# Patient Record
Sex: Female | Born: 1990 | Race: White | Hispanic: No | Marital: Single | Smoking: Never smoker
Health system: Southern US, Community
[De-identification: ages and names within clinical notes are randomized; demographics above are authoritative.]

## PROBLEM LIST (undated history)

## (undated) DIAGNOSIS — F319 Bipolar disorder, unspecified: Secondary | ICD-10-CM

## (undated) DIAGNOSIS — F32A Depression, unspecified: Secondary | ICD-10-CM

## (undated) DIAGNOSIS — I10 Essential (primary) hypertension: Secondary | ICD-10-CM

## (undated) DIAGNOSIS — T7840XA Allergy, unspecified, initial encounter: Secondary | ICD-10-CM

## (undated) DIAGNOSIS — R7303 Prediabetes: Secondary | ICD-10-CM

## (undated) DIAGNOSIS — F329 Major depressive disorder, single episode, unspecified: Secondary | ICD-10-CM

## (undated) DIAGNOSIS — Z789 Other specified health status: Secondary | ICD-10-CM

## (undated) DIAGNOSIS — K219 Gastro-esophageal reflux disease without esophagitis: Secondary | ICD-10-CM

## (undated) DIAGNOSIS — F419 Anxiety disorder, unspecified: Secondary | ICD-10-CM

## (undated) DIAGNOSIS — D649 Anemia, unspecified: Secondary | ICD-10-CM

## (undated) HISTORY — DX: Anemia, unspecified: D64.9

## (undated) HISTORY — DX: Depression, unspecified: F32.A

## (undated) HISTORY — DX: Anxiety disorder, unspecified: F41.9

## (undated) HISTORY — DX: Major depressive disorder, single episode, unspecified: F32.9

## (undated) HISTORY — DX: Allergy, unspecified, initial encounter: T78.40XA

## (undated) HISTORY — PX: NO PAST SURGERIES: SHX2092

---
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
## 2011-01-19 ENCOUNTER — Observation Stay: Payer: Self-pay

## 2011-01-20 ENCOUNTER — Ambulatory Visit: Payer: Self-pay | Admitting: Obstetrics and Gynecology

## 2011-01-20 ENCOUNTER — Observation Stay: Payer: Self-pay

## 2011-02-02 ENCOUNTER — Observation Stay: Payer: Self-pay

## 2011-02-09 ENCOUNTER — Observation Stay: Payer: Self-pay

## 2011-02-19 ENCOUNTER — Observation Stay: Payer: Self-pay

## 2011-02-24 ENCOUNTER — Inpatient Hospital Stay: Payer: Self-pay | Admitting: Obstetrics and Gynecology

## 2011-03-28 ENCOUNTER — Emergency Department (HOSPITAL_COMMUNITY)
Admission: EM | Admit: 2011-03-28 | Discharge: 2011-03-29 | Disposition: A | Discharge status: Home / Self Care | Payer: Medicaid Other | Attending: Emergency Medicine | Admitting: Emergency Medicine

## 2011-03-28 ENCOUNTER — Encounter (HOSPITAL_COMMUNITY): Payer: Self-pay | Admitting: *Deleted

## 2011-03-28 DIAGNOSIS — B349 Viral infection, unspecified: Secondary | ICD-10-CM

## 2011-03-28 DIAGNOSIS — B9789 Other viral agents as the cause of diseases classified elsewhere: Secondary | ICD-10-CM | POA: Insufficient documentation

## 2011-03-28 DIAGNOSIS — N390 Urinary tract infection, site not specified: Secondary | ICD-10-CM

## 2011-03-28 DIAGNOSIS — R05 Cough: Secondary | ICD-10-CM | POA: Insufficient documentation

## 2011-03-28 DIAGNOSIS — R059 Cough, unspecified: Secondary | ICD-10-CM | POA: Insufficient documentation

## 2011-03-28 DIAGNOSIS — E876 Hypokalemia: Secondary | ICD-10-CM

## 2011-03-28 NOTE — ED Notes (Signed)
Pt states she started to cough, chills, and body aches yesterday.

## 2011-03-29 ENCOUNTER — Emergency Department (HOSPITAL_COMMUNITY): Payer: Medicaid Other

## 2011-03-29 LAB — URINE MICROSCOPIC-ADD ON

## 2011-03-29 LAB — URINALYSIS, ROUTINE W REFLEX MICROSCOPIC
Bilirubin Urine: NEGATIVE
Glucose, UA: NEGATIVE mg/dL
Hgb urine dipstick: NEGATIVE
Ketones, ur: NEGATIVE mg/dL
Nitrite: NEGATIVE
Protein, ur: NEGATIVE mg/dL
Specific Gravity, Urine: 1.013 (ref 1.005–1.030)
Urobilinogen, UA: 0.2 mg/dL (ref 0.0–1.0)
pH: 8 (ref 5.0–8.0)

## 2011-03-29 LAB — POCT I-STAT, CHEM 8
Creatinine, Ser: 0.7 mg/dL (ref 0.50–1.10)
Hemoglobin: 11.2 g/dL — ABNORMAL LOW (ref 12.0–15.0)
Sodium: 145 mEq/L (ref 135–145)
TCO2: 24 mmol/L (ref 0–100)

## 2011-03-29 LAB — RAPID STREP SCREEN (MED CTR MEBANE ONLY): Streptococcus, Group A Screen (Direct): NEGATIVE

## 2011-03-29 LAB — PREGNANCY, URINE: Preg Test, Ur: NEGATIVE

## 2011-03-29 MED ORDER — MORPHINE SULFATE 4 MG/ML IJ SOLN: 4.0000 mg | Freq: Once | INTRAMUSCULAR | Status: DC

## 2011-03-29 MED ORDER — SODIUM CHLORIDE 0.9 % IV BOLUS (SEPSIS)
1000.0000 mL | Freq: Once | INTRAVENOUS | Status: AC
  Administered 2011-03-29: 1000 mL via INTRAVENOUS

## 2011-03-29 MED ORDER — POTASSIUM CHLORIDE 20 MEQ/15ML (10%) PO LIQD
40.0000 meq | Freq: Once | ORAL | Status: AC
  Administered 2011-03-29: 40 meq via ORAL
  Filled 2011-03-29: qty 30

## 2011-03-29 MED ORDER — MORPHINE SULFATE 2 MG/ML IJ SOLN
4.0000 mg | Freq: Once | INTRAMUSCULAR | Status: AC
  Administered 2011-03-29: 4 mg via INTRAVENOUS
  Filled 2011-03-29: qty 2

## 2011-03-29 MED ORDER — DEXAMETHASONE SODIUM PHOSPHATE 10 MG/ML IJ SOLN
10.0000 mg | Freq: Once | INTRAMUSCULAR | Status: AC
  Administered 2011-03-29: 10 mg via INTRAVENOUS
  Filled 2011-03-29: qty 1

## 2011-03-29 MED ORDER — ONDANSETRON HCL 4 MG/2ML IJ SOLN
4.0000 mg | Freq: Once | INTRAMUSCULAR | Status: AC
Start: 2011-03-29 — End: 2011-03-29
  Administered 2011-03-29: 4 mg via INTRAVENOUS
  Filled 2011-03-29: qty 2

## 2011-03-29 MED ORDER — NITROFURANTOIN MONOHYD MACRO 100 MG PO CAPS: 100.0000 mg | ORAL_CAPSULE | Freq: Two times a day (BID) | ORAL | Status: AC

## 2011-03-29 NOTE — ED Provider Notes (Signed)
History     CSN: [id number] Arrival date & time: 03/28/2011 11:19 PM   First MD Initiated Contact with Patient 03/29/11 0033      Chief Complaint  Patient presents with  . Influenza     Patient is a 19 y.o. female presenting with flu symptoms. The history is provided by the patient.  Influenza This is a new problem. The current episode started yesterday. The problem has been gradually worsening. Associated symptoms include chills, congestion, coughing, a fever and a sore throat. Pertinent negatives include no nausea, rash, urinary symptoms or vomiting. The symptoms are aggravated by nothing. She has tried acetaminophen and NSAIDs for the symptoms. The treatment provided no relief.  Reports onset of sorethroat, bodyaches, and fever yesterday.  Past Medical History  Diagnosis Date  . No pertinent past medical history     Past Surgical History  Procedure Date  . No past surgeries     No family history on file.  History  Substance Use Topics  . Smoking status: Never Smoker   . Smokeless tobacco: Not on file  . Alcohol Use: Yes     occa    OB History    Grav Para Term Preterm Abortions TAB SAB Ect Mult Living   1         0      Review of Systems  Constitutional: Positive for fever and chills.  HENT: Positive for congestion and sore throat.   Respiratory: Positive for cough.   Gastrointestinal: Negative for nausea and vomiting.  Skin: Negative for rash.    Allergies  Erythromycin  Home Medications   Current Outpatient Rx  Name Route Sig Dispense Refill  . ACETAMINOPHEN 100 MG/ML PO SOLN Oral Take 10 mg/kg by mouth every 4 (four) hours as needed. For headache     . MENTHOL 3 MG MT LOZG Oral Take 1 lozenge by mouth as needed. For throat pain     . OMEPRAZOLE 20 MG PO CPDR Oral Take 20 mg by mouth daily.      Marland Kitchen PROMETHAZINE HCL 25 MG RE SUPP Rectal Place 25 mg rectally every 6 (six) hours as needed. For nausea and vomiting       BP 115/50  Pulse 81   Temp(Src) 97.8 F (36.6 C) (Oral)  Resp 16  Ht 5\' 1"  (1.549 m)  Wt 124 lb (56.246 kg)  BMI 23.43 kg/m2  SpO2 99%  Breastfeeding? Unknown  Physical Exam  ED Course  Procedures Findings,  clinical impression and d/c plan. discussed w/ pt. Will plan for d/c home w/ tx for UTI and info regarding management of viral illness @ome . Pt is agreeable w/ plan.  Labs Reviewed  URINALYSIS, ROUTINE W REFLEX MICROSCOPIC - Abnormal; Notable for the following:    APPearance CLOUDY (*)    Leukocytes, UA LARGE (*)    All other components within normal limits  POCT I-STAT, CHEM 8 - Abnormal; Notable for the following:    Potassium 3.1 (*)    BUN <3 (*)    Hemoglobin 11.2 (*)    HCT 33.0 (*)    All other components within normal limits  URINE MICROSCOPIC-ADD ON - Abnormal; Notable for the following:    Squamous Epithelial / LPF MANY (*)    Bacteria, UA MANY (*)    All other components within normal limits  PREGNANCY, URINE  RAPID STREP SCREEN  I-STAT, CHEM 8  POCT PREGNANCY, URINE   Dg Chest 2 View  03/29/2011  *RADIOLOGY REPORT*  Clinical Data: Fever and cough.  CHEST - 2 VIEW  Comparison: 07/26/2008  Findings: The lungs are clear without focal consolidation, edema, effusion or pneumothorax.  Cardiopericardial silhouette is within normal limits for size.  Imaged bony structures of the thorax are intact.  IMPRESSION: Normal exam.  Original Report Authenticated By: ERIC A. MANSELL, M.D.     No diagnosis found.    MDM  HPI, PE and clinical findings c/w UTI and viral illness.        Leanne Chang, NP 04/03/11 [phone]

## 2011-04-03 NOTE — ED Provider Notes (Signed)
Medical screening examination/treatment/procedure(s) were performed by non-physician practitioner and as supervising physician I was immediately available for consultation/collaboration.  Cyndra Numbers, MD 04/03/11 [phone]

## 2011-11-07 ENCOUNTER — Encounter (HOSPITAL_COMMUNITY): Payer: Self-pay | Admitting: *Deleted

## 2011-11-07 ENCOUNTER — Emergency Department (HOSPITAL_COMMUNITY)
Admission: EM | Admit: 2011-11-07 | Discharge: 2011-11-07 | Disposition: A | Discharge status: Home / Self Care | Payer: Self-pay | Attending: Emergency Medicine | Admitting: Emergency Medicine

## 2011-11-07 DIAGNOSIS — N760 Acute vaginitis: Secondary | ICD-10-CM | POA: Insufficient documentation

## 2011-11-07 DIAGNOSIS — N72 Inflammatory disease of cervix uteri: Secondary | ICD-10-CM

## 2011-11-07 LAB — GC/CHLAMYDIA PROBE AMP, GENITAL
Chlamydia, DNA Probe: NEGATIVE
GC Probe Amp, Genital: NEGATIVE

## 2011-11-07 LAB — COMPREHENSIVE METABOLIC PANEL
ALT: 12 U/L (ref 0–35)
AST: 18 U/L (ref 0–37)
Albumin: 3.9 g/dL (ref 3.5–5.2)
CO2: 26 mEq/L (ref 19–32)
Calcium: 9.3 mg/dL (ref 8.4–10.5)
Creatinine, Ser: 0.67 mg/dL (ref 0.50–1.10)
Sodium: 143 mEq/L (ref 135–145)
Total Protein: 7.5 g/dL (ref 6.0–8.3)

## 2011-11-07 LAB — CBC WITH DIFFERENTIAL/PLATELET
Basophils Absolute: 0 10*3/uL (ref 0.0–0.1)
Basophils Relative: 0 % (ref 0–1)
Eosinophils Absolute: 0.1 10*3/uL (ref 0.0–0.7)
Eosinophils Relative: 1 % (ref 0–5)
Lymphocytes Relative: 39 % (ref 12–46)
MCHC: 32.6 g/dL (ref 30.0–36.0)
MCV: 82.2 fL (ref 78.0–100.0)
Monocytes Absolute: 0.5 10*3/uL (ref 0.1–1.0)
Platelets: 356 10*3/uL (ref 150–400)
RDW: 15.7 % — ABNORMAL HIGH (ref 11.5–15.5)
WBC: 7.7 10*3/uL (ref 4.0–10.5)

## 2011-11-07 LAB — WET PREP, GENITAL

## 2011-11-07 LAB — URINALYSIS, ROUTINE W REFLEX MICROSCOPIC
Bilirubin Urine: NEGATIVE
Nitrite: NEGATIVE
Protein, ur: 30 mg/dL — AB
Specific Gravity, Urine: 1.029 (ref 1.005–1.030)
Urobilinogen, UA: 0.2 mg/dL (ref 0.0–1.0)

## 2011-11-07 LAB — URINE MICROSCOPIC-ADD ON

## 2011-11-07 MED ORDER — CEFTRIAXONE SODIUM 1 G IJ SOLR
1.0000 g | Freq: Once | INTRAMUSCULAR | Status: AC
  Administered 2011-11-07: 1 g via INTRAMUSCULAR
  Filled 2011-11-07: qty 10

## 2011-11-07 MED ORDER — DOXYCYCLINE HYCLATE 100 MG PO TABS
100.0000 mg | ORAL_TABLET | Freq: Once | ORAL | Status: AC
  Administered 2011-11-07: 100 mg via ORAL
  Filled 2011-11-07: qty 1

## 2011-11-07 MED ORDER — METRONIDAZOLE 500 MG PO TABS: 500.0000 mg | ORAL_TABLET | Freq: Two times a day (BID) | ORAL | Status: AC

## 2011-11-07 MED ORDER — DOXYCYCLINE HYCLATE 100 MG PO CAPS: 100.0000 mg | ORAL_CAPSULE | Freq: Two times a day (BID) | ORAL | Status: AC

## 2011-11-07 NOTE — ED Notes (Signed)
The pt thinks she has a yeast infection.  Vaginal discharge mixed with blood.  lmp  She has a mirano

## 2011-11-07 NOTE — ED Provider Notes (Signed)
History     CSN: [id number]  Arrival date & time 11/07/11  0132   First MD Initiated Contact with Patient 11/07/11 [phone]      Chief Complaint  Patient presents with  . Vaginal Discharge    (Consider location/radiation/quality/duration/timing/severity/associated sxs/prior treatment) Patient is a 21 y.o. female presenting with vaginal discharge. The history is provided by the patient.  Vaginal Discharge This is a new problem. The current episode started yesterday. The problem occurs constantly. The problem has not changed since onset.Pertinent negatives include no chest pain, no abdominal pain, no headaches and no shortness of breath. Nothing aggravates the symptoms. Nothing relieves the symptoms. She has tried nothing for the symptoms.    Past Medical History  Diagnosis Date  . No pertinent past medical history     Past Surgical History  Procedure Date  . No past surgeries     No family history on file.  History  Substance Use Topics  . Smoking status: Never Smoker   . Smokeless tobacco: Not on file  . Alcohol Use: Yes     occa    OB History    Grav Para Term Preterm Abortions TAB SAB Ect Mult Living   1         0      Review of Systems  Respiratory: Negative for shortness of breath.   Cardiovascular: Negative for chest pain.  Gastrointestinal: Negative for abdominal pain.  Genitourinary: Positive for vaginal discharge.  Neurological: Negative for headaches.  All other systems reviewed and are negative.    Allergies  Erythromycin  Home Medications   Current Outpatient Rx  Name Route Sig Dispense Refill  . DOXYCYCLINE HYCLATE 100 MG PO CAPS Oral Take 1 capsule (100 mg total) by mouth 2 (two) times daily. 20 capsule 0  . METRONIDAZOLE 500 MG PO TABS Oral Take 1 tablet (500 mg total) by mouth 2 (two) times daily. 14 tablet 0    BP 110/72  Pulse 82  Temp 98.1 F (36.7 C) (Oral)  Resp 20  SpO2 99%  Physical Exam  Constitutional: She is oriented to  person, place, and time. She appears well-developed and well-nourished.  HENT:  Head: Normocephalic and atraumatic.  Eyes: Conjunctivae and EOM are normal. Pupils are equal, round, and reactive to light.  Neck: Normal range of motion.  Cardiovascular: Normal rate, regular rhythm and normal heart sounds.   Pulmonary/Chest: Effort normal and breath sounds normal.  Abdominal: Soft. Bowel sounds are normal.  Genitourinary: Vaginal discharge found.       iud in place.  + cervicitis  Musculoskeletal: Normal range of motion.  Neurological: She is alert and oriented to person, place, and time.  Skin: Skin is warm and dry.  Psychiatric: She has a normal mood and affect. Her behavior is normal.    ED Course  Procedures (including critical care time)  Labs Reviewed  URINALYSIS, ROUTINE W REFLEX MICROSCOPIC - Abnormal; Notable for the following:    APPearance CLOUDY (*)     Hgb urine dipstick LARGE (*)     Protein, ur 30 (*)     Leukocytes, UA LARGE (*)     All other components within normal limits  CBC WITH DIFFERENTIAL - Abnormal; Notable for the following:    Hemoglobin 11.1 (*)     HCT 34.1 (*)     RDW 15.7 (*)     All other components within normal limits  COMPREHENSIVE METABOLIC PANEL - Abnormal; Notable for the following:  Total Bilirubin 0.2 (*)     All other components within normal limits  URINE MICROSCOPIC-ADD ON - Abnormal; Notable for the following:    Squamous Epithelial / LPF MANY (*)     Bacteria, UA MANY (*)     All other components within normal limits  WET PREP, GENITAL - Abnormal; Notable for the following:    Clue Cells Wet Prep HPF POC FEW (*)     WBC, Wet Prep HPF POC TOO NUMEROUS TO COUNT (*)     All other components within normal limits  PREGNANCY, URINE  GC/CHLAMYDIA PROBE AMP, GENITAL   No results found.   1. Cervicitis       MDM  + cervicitis. Macrolide allergy.  Will rocephin,  Flagyl and doxy. Dc to fu, ret new/worsening  sxs        Cobie Leidner Lytle Michaels, MD 11/07/11 [phone]

## 2011-12-27 ENCOUNTER — Encounter (HOSPITAL_COMMUNITY): Payer: Self-pay | Admitting: *Deleted

## 2011-12-27 ENCOUNTER — Emergency Department (HOSPITAL_COMMUNITY): Payer: Worker's Compensation

## 2011-12-27 ENCOUNTER — Emergency Department (HOSPITAL_COMMUNITY)
Admission: EM | Admit: 2011-12-27 | Discharge: 2011-12-27 | Disposition: A | Discharge status: Home / Self Care | Payer: Worker's Compensation | Attending: Emergency Medicine | Admitting: Emergency Medicine

## 2011-12-27 ENCOUNTER — Encounter (HOSPITAL_COMMUNITY): Payer: Self-pay | Admitting: Emergency Medicine

## 2011-12-27 DIAGNOSIS — W208XXA Other cause of strike by thrown, projected or falling object, initial encounter: Secondary | ICD-10-CM | POA: Insufficient documentation

## 2011-12-27 DIAGNOSIS — R51 Headache: Secondary | ICD-10-CM | POA: Insufficient documentation

## 2011-12-27 DIAGNOSIS — S0990XA Unspecified injury of head, initial encounter: Secondary | ICD-10-CM | POA: Insufficient documentation

## 2011-12-27 DIAGNOSIS — Y92009 Unspecified place in unspecified non-institutional (private) residence as the place of occurrence of the external cause: Secondary | ICD-10-CM | POA: Insufficient documentation

## 2011-12-27 DIAGNOSIS — R42 Dizziness and giddiness: Secondary | ICD-10-CM | POA: Insufficient documentation

## 2011-12-27 MED ORDER — ONDANSETRON 4 MG PO TBDP
4.0000 mg | ORAL_TABLET | Freq: Once | ORAL | Status: AC
  Administered 2011-12-27: 4 mg via ORAL
  Filled 2011-12-27: qty 1

## 2011-12-27 MED ORDER — METOCLOPRAMIDE HCL 5 MG/ML IJ SOLN
10.0000 mg | Freq: Once | INTRAMUSCULAR | Status: AC
  Administered 2011-12-27: 10 mg via INTRAMUSCULAR
  Filled 2011-12-27: qty 2

## 2011-12-27 MED ORDER — ONDANSETRON HCL 4 MG PO TABS: 4.0000 mg | ORAL_TABLET | Freq: Four times a day (QID) | ORAL | Status: AC

## 2011-12-27 MED ORDER — ONDANSETRON HCL 4 MG/2ML IJ SOLN
INTRAMUSCULAR | Status: AC
  Administered 2011-12-27: 10:00:00
  Filled 2011-12-27: qty 2

## 2011-12-27 MED ORDER — HYDROMORPHONE HCL PF 1 MG/ML IJ SOLN
1.0000 mg | Freq: Once | INTRAMUSCULAR | Status: AC
  Administered 2011-12-27: 1 mg via INTRAMUSCULAR
  Filled 2011-12-27: qty 1
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
## 2011-12-27 NOTE — ED Notes (Signed)
After IV d/c'd pt vomited moderate amt onto floor.

## 2011-12-27 NOTE — ED Notes (Signed)
Family at bedside. Side rails up x 2 call bell at bedside
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
## 2011-12-27 NOTE — ED Provider Notes (Signed)
Medical screening examination/treatment/procedure(s) were performed by non-physician practitioner and as supervising physician I was immediately available for consultation/collaboration.  Cheri Guppy, MD 12/27/11 [phone]

## 2011-12-27 NOTE — ED Notes (Signed)
MD at bedside. 
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
## 2013-07-22 ENCOUNTER — Emergency Department: Payer: Self-pay | Admitting: Emergency Medicine

## 2013-07-22 LAB — CBC
HCT: 36.3 % (ref 35.0–47.0)
HGB: 12.1 g/dL (ref 12.0–16.0)
MCH: 29.2 pg (ref 26.0–34.0)
MCHC: 33.4 g/dL (ref 32.0–36.0)
MCV: 87 fL (ref 80–100)
PLATELETS: 341 10*3/uL (ref 150–440)
RBC: 4.16 10*6/uL (ref 3.80–5.20)
RDW: 14.5 % (ref 11.5–14.5)
WBC: 9.2 10*3/uL (ref 3.6–11.0)

## 2013-07-22 LAB — COMPREHENSIVE METABOLIC PANEL
ALK PHOS: 70 U/L
ALT: 25 U/L (ref 12–78)
ANION GAP: 4 — AB (ref 7–16)
AST: 25 U/L (ref 15–37)
Albumin: 3.8 g/dL (ref 3.4–5.0)
BUN: 12 mg/dL (ref 7–18)
Bilirubin,Total: 0.2 mg/dL (ref 0.2–1.0)
CREATININE: 0.76 mg/dL (ref 0.60–1.30)
Calcium, Total: 8.3 mg/dL — ABNORMAL LOW (ref 8.5–10.1)
Chloride: 109 mmol/L — ABNORMAL HIGH (ref 98–107)
Co2: 27 mmol/L (ref 21–32)
EGFR (Non-African Amer.): >60
GLUCOSE: 88 mg/dL (ref 65–99)
Osmolality: 279 (ref 275–301)
POTASSIUM: 3.5 mmol/L (ref 3.5–5.1)
Sodium: 140 mmol/L (ref 136–145)
Total Protein: 7.7 g/dL (ref 6.4–8.2)

## 2013-07-22 LAB — URINALYSIS, COMPLETE
BACTERIA: NONE SEEN
BILIRUBIN, UR: NEGATIVE
BLOOD: NEGATIVE
GLUCOSE, UR: NEGATIVE mg/dL (ref 0–75)
Ketone: NEGATIVE
NITRITE: NEGATIVE
PROTEIN: NEGATIVE
Ph: 6 (ref 4.5–8.0)
Specific Gravity: 1.021 (ref 1.003–1.030)
Squamous Epithelial: 9
WBC UR: 7 /HPF (ref 0–5)

## 2013-07-22 LAB — TROPONIN I: Troponin-I: <0.02 ng/mL

## 2013-07-22 LAB — LIPASE, BLOOD: Lipase: 163 U/L (ref 73–393)

## 2013-09-29 ENCOUNTER — Emergency Department: Payer: Self-pay | Admitting: Emergency Medicine

## 2014-02-16 ENCOUNTER — Encounter (HOSPITAL_COMMUNITY): Payer: Self-pay | Admitting: *Deleted

## 2014-05-13 ENCOUNTER — Emergency Department: Payer: Self-pay | Admitting: Emergency Medicine

## 2014-05-16 ENCOUNTER — Emergency Department: Payer: Self-pay | Admitting: Student

## 2015-04-14 ENCOUNTER — Encounter (HOSPITAL_COMMUNITY): Payer: Self-pay | Admitting: Emergency Medicine

## 2015-04-14 ENCOUNTER — Emergency Department (HOSPITAL_COMMUNITY)
Admission: EM | Admit: 2015-04-14 | Discharge: 2015-04-15 | Disposition: A | Discharge status: Home / Self Care | Payer: BLUE CROSS/BLUE SHIELD | Attending: Emergency Medicine | Admitting: Emergency Medicine

## 2015-04-14 DIAGNOSIS — Y9389 Activity, other specified: Secondary | ICD-10-CM | POA: Diagnosis not present

## 2015-04-14 DIAGNOSIS — S8992XA Unspecified injury of left lower leg, initial encounter: Secondary | ICD-10-CM | POA: Diagnosis not present

## 2015-04-14 DIAGNOSIS — M25562 Pain in left knee: Secondary | ICD-10-CM

## 2015-04-14 DIAGNOSIS — Y9289 Other specified places as the place of occurrence of the external cause: Secondary | ICD-10-CM | POA: Diagnosis not present

## 2015-04-14 DIAGNOSIS — Y998 Other external cause status: Secondary | ICD-10-CM | POA: Diagnosis not present

## 2015-04-14 DIAGNOSIS — W010XXA Fall on same level from slipping, tripping and stumbling without subsequent striking against object, initial encounter: Secondary | ICD-10-CM | POA: Insufficient documentation

## 2015-04-14 NOTE — ED Provider Notes (Signed)
CSN: [id number]     Arrival date & time 04/14/15  2259 History  By signing my name below, I, Irene Pap, attest that this documentation has been prepared under the direction and in the presence of [employer], PA-C. Electronically Signed: Irene Pap, ED Scribe. 04/14/2015. 12:01 AM.   Chief Complaint  Patient presents with  . Knee Injury   The history is provided by the patient. No language interpreter was used.  HPI Comments: Joan Simpson is a 24 y.o. female who presents to the Emergency Department complaining of gradually worsening, constant, left knee pain and swelling onset one day ago. Pt states that she tripped over brick and fell onto her left knee yesterday. She is not sure of any twisting mechanism of the knee. She reports worsening pain with bending at the knee, weightbearing, palpation, and ambulation. Pt reports putting ice and heat on the area to no relief. She reports her pain 10/10. She denies numbness, weakness, ankle pain, hitting head, or LOC. Pt is allergic to Tylenol, ibuprofen and erythromycin.   Past Medical History  Diagnosis Date  . No pertinent past medical history    Past Surgical History  Procedure Laterality Date  . No past surgeries     No family history on file. Social History  Substance Use Topics  . Smoking status: Never Smoker   . Smokeless tobacco: None  . Alcohol Use: Yes     Comment: occa   OB History    Gravida Para Term Preterm AB TAB SAB Ectopic Multiple Living   1         0     Review of Systems  Musculoskeletal: Positive for joint swelling and arthralgias.  Neurological: Negative for syncope, weakness and numbness.  All other systems reviewed and are negative.  Allergies  Erythromycin; Ibuprofen; and Tylenol  Home Medications   Prior to Admission medications   Medication Sig Start Date End Date Taking? Authorizing Provider  acetaminophen (TYLENOL) 325 MG tablet Take 650 mg by mouth every 4 (four) hours as needed.  For pain    Historical Provider, MD   BP 107/47 mmHg  Pulse 84  Temp(Src) 97.9 F (36.6 C) (Oral)  Resp 18  Ht 5\' 1"  (1.549 m)  Wt 57.38 kg  BMI 23.91 kg/m2  SpO2 100% Physical Exam  Constitutional: She is oriented to person, place, and time. She appears well-developed and well-nourished.  HENT:  Head: Normocephalic.  Mouth/Throat: Oropharynx is clear and moist.  Eyes: EOM are normal.  Neck: Normal range of motion.  Cardiovascular: Normal rate, regular rhythm and normal heart sounds.   Pulmonary/Chest: Effort normal and breath sounds normal.  Abdominal: Soft. There is no tenderness.  Musculoskeletal:  Diffuse tenderness throughout entire left knee along joint line and patella with mild abrasion over patella; no obvious swelling, erythema, warmth or deformity; ROM decreased secondary to pain; sensation intact to light touch with intact distal pulses  Neurological: She is alert and oriented to person, place, and time.  Psychiatric: She has a normal mood and affect.  Nursing note and vitals reviewed.   ED Course  Procedures (including critical care time) DIAGNOSTIC STUDIES: Oxygen Saturation is 97% on RA, normal by my interpretation.    COORDINATION OF CARE: 12:00 AM-Discussed treatment plan which includes x-ray with pt at bedside and pt agreed to plan.    Labs Review Labs Reviewed - No data to display  Imaging Review Dg Knee Complete 4 Views Left  04/15/2015  CLINICAL DATA:  Tripped  and fell yesterday, 10/10 knee pain. EXAM: LEFT KNEE - COMPLETE 4+ VIEW COMPARISON:  None. FINDINGS: There is no evidence of fracture, dislocation, or joint effusion. There is no evidence of arthropathy or other focal bone abnormality. Soft tissues are unremarkable. IMPRESSION: Negative. Electronically Signed   By: Elon Alas M.D.   On: 04/15/2015 00:31   I have personally reviewed and evaluated these images and lab results as part of my medical decision-making.   EKG  Interpretation None      Medications - No data to display Filed Vitals:   04/14/15 2329 04/15/15 0108  BP: 114/69 107/47  Pulse: 100 84  Temp: 97.9 F (36.6 C)   Resp: 18 18    Patient complains of extreme pain in her left knee, 10/10, however is texting and playing games on her phone in no apparent distress. MDM  Patient presents with knee pain after falling down steps yesterday. Has been ambulatory since that time. Patient X-Ray negative for obvious fracture or dislocation. Patient with nonfocal neuro exam, no edema, erythema or overt warmth to the joint. Patient is not fully cooperative with physical exam. Low suspicion of hemarthrosis, septic joint. Doubt tibial plateau fracture. Pt advised to follow up with orthopedics if symptoms persist for possibility of missed fracture diagnosis. Patient given brace while in ED and crutches, conservative therapy recommended and discussed. Patient will be dc home & is agreeable with above plan.  Final diagnoses:  Left knee pain    I personally performed the services described in this documentation, which was scribed in my presence. The recorded information has been reviewed and is accurate.     Comer Locket, PA-C 04/15/15 0148  Veryl Speak, MD 04/15/15 [phone]

## 2015-04-14 NOTE — ED Notes (Signed)
Pt. tripped and fell forward yesterday injured her left knee with pain and mild swelling , denies LOC / ambulatory .

## 2015-04-15 ENCOUNTER — Emergency Department (HOSPITAL_COMMUNITY): Payer: BLUE CROSS/BLUE SHIELD

## 2015-04-15 ENCOUNTER — Emergency Department (HOSPITAL_COMMUNITY)
Admission: EM | Admit: 2015-04-15 | Discharge: 2015-04-15 | Discharge status: Absent without leave | Payer: BLUE CROSS/BLUE SHIELD

## 2015-04-15 ENCOUNTER — Ambulatory Visit (INDEPENDENT_AMBULATORY_CARE_PROVIDER_SITE_OTHER): Payer: BLUE CROSS/BLUE SHIELD

## 2015-04-15 ENCOUNTER — Ambulatory Visit (INDEPENDENT_AMBULATORY_CARE_PROVIDER_SITE_OTHER): Payer: BLUE CROSS/BLUE SHIELD | Admitting: Physician Assistant

## 2015-04-15 VITALS — BP 102/74 | HR 100 | Temp 98.2°F | Resp 16 | Ht 61.0 in

## 2015-04-15 DIAGNOSIS — M79605 Pain in left leg: Secondary | ICD-10-CM

## 2015-04-15 DIAGNOSIS — M25562 Pain in left knee: Secondary | ICD-10-CM

## 2015-04-15 MED ORDER — HYDROCODONE-ACETAMINOPHEN 5-325 MG PO TABS: 1.0000 | ORAL_TABLET | Freq: Four times a day (QID) | ORAL | Status: DC | PRN

## 2015-04-15 MED ORDER — ACETAMINOPHEN 325 MG PO TABS
1000.0000 mg | ORAL_TABLET | Freq: Once | ORAL | Status: AC
  Administered 2015-04-15: 975 mg via ORAL

## 2015-04-15 NOTE — ED Notes (Signed)
PA at bedside.

## 2015-04-15 NOTE — Progress Notes (Signed)
[facility] [address] [place] [address] [phone]  Date:  04/15/2015   Name:  Joan Simpson   DOB:  July 27, 1990   MRN:  [id number]  PCP:  PROVIDER NOT IN SYSTEM    Chief Complaint: Knee Pain and Medication Refill   History of Present Illness:  This is a 24 y.o. female with PMH anxiety who is presenting with left knee injury that occurred 3 days ago. She was tripped going down some stairs off a porch. She tripped and fell, falling from top stop to the bottom (4 steps total) and landing directing onto her left knee on brick. She was seen in the ED yesterday 12/28. Radiograph of the knee was negative at that time. They gave her crutches. She states since that time her pain has been worsening. Pain rated 10/10. She is unable to put any pressure whatsoever on that leg. She denies numbness.  When she got here and was roomed, she started to feel nauseated from the pain. She got up quickly to throw up in the trash can but fell. She was unable to get up on her own, we helped her back onto the exam table. Pt was crying and started hyperventilating. This got better with breathing into a bag.  Review of Systems:  Review of Systems See HPI  There are no active problems to display for this patient.   Prior to Admission medications   Medication Sig Start Date End Date Taking? Authorizing Provider  clonazePAM (KLONOPIN) 0.5 MG tablet Take 0.5 mg by mouth 2 (two) times daily as needed for anxiety.   Yes Historical Provider, MD  Sertraline HCl (ZOLOFT PO) Take by mouth.   Yes Historical Provider, MD  acetaminophen (TYLENOL) 325 MG tablet Take 650 mg by mouth every 4 (four) hours as needed. Reported on 04/15/2015    Historical Provider, MD    Allergies  Allergen Reactions  . Erythromycin Anaphylaxis    Says she throws up and can't breath  . Ibuprofen   . Tylenol [Acetaminophen]     Past Surgical History  Procedure Laterality Date  . No past surgeries       Social History  Substance Use Topics  . Smoking status: Never Smoker   . Smokeless tobacco: None  . Alcohol Use: Yes     Comment: occa    History reviewed. No pertinent family history.  Medication list has been reviewed and updated.  Physical Examination:  Physical Exam  Constitutional: She is oriented to person, place, and time. She appears well-developed and well-nourished. No distress.  HENT:  Head: Normocephalic and atraumatic.  Right Ear: Hearing normal.  Left Ear: Hearing normal.  Nose: Nose normal.  Eyes: Conjunctivae and lids are normal. Right eye exhibits no discharge. Left eye exhibits no discharge. No scleral icterus.  Cardiovascular: Normal rate, regular rhythm and normal pulses.   Pulmonary/Chest: Effort normal. No respiratory distress.  Musculoskeletal:       Right knee: Normal.       Left knee: She exhibits decreased range of motion (unwilling to flex knee) and swelling (mild knee swelling). Tenderness (diffuse, over knee and lower leg) found.  Mild ecchymosis lateral left knee Pain out of proportion to exam. Light touch of the skin around knee and lower leg produces pain. Deeper palpation causes significant pain that pt cannot tolerate. Pedal pulses intact. Sensation intact.  Neurological: She is alert and oriented to person, place, and time.  Uses crutches for ambulation  Skin:  Skin is warm, dry and intact. No lesion and no rash noted.  Psychiatric: She has a normal mood and affect. Her speech is normal and behavior is normal. Thought content normal.    BP 102/74 mmHg  Pulse 100  Temp(Src) 98.2 F (36.8 C) (Oral)  Resp 16  Ht 5\' 1"  (1.549 m)  SpO2 98%  [place] controlled substance database: negative except for clonazepam  Assessment and Plan:  1. Left knee pain 2. Leg pain, left Pain out of proportion to exam. Gave #10 norco. Sent to ED for further evaluation to rule out possible DVT or compartment syndrome, although unlikely. Spoke to Child psychotherapist. - DG Knee Complete 4 Views Left; Future - acetaminophen (TYLENOL) tablet 975 mg; Take 3 tablets (975 mg total) by mouth once. - HYDROcodone-acetaminophen (NORCO) 5-325 MG tablet; Take 1 tablet by mouth every 6 (six) hours as needed.  Dispense: 10 tablet; Refill: 0 - DG Tibia/Fibula Left; Future - acetaminophen (TYLENOL) tablet 975 mg; Take 3 tablets (975 mg total) by mouth once. - HYDROcodone-acetaminophen (NORCO) 5-325 MG tablet; Take 1 tablet by mouth every 6 (six) hours as needed.  Dispense: 10 tablet; Refill: 0   Benjaman Pott. Drenda Freeze, MHS [facility]  04/15/2015

## 2015-04-15 NOTE — Discharge Instructions (Signed)
There does not appear to be an emergent cause for your knee pain at this time. Your x-rays were negative for any broken bones or dislocations. It is important she follow up with your doctor or Dr. Doran Durand with [facility] orthopedics for further evaluation of your knee pain. Use your knee brace as directed. Return to ED for any new or worsening symptoms as we discussed  How to Use a Knee Brace A knee brace is a device that you wear to support your knee, especially if the knee is healing after an injury or surgery. There are several types of knee braces. Some are designed to prevent an injury (prophylactic brace). These are often worn during sports. Others support an injured knee (functional brace) or keep it still while it heals (rehabilitative brace). People with severe arthritis of the knee may benefit from a brace that takes some pressure off the knee (unloader brace). Most knee braces are made from a combination of cloth and metal or plastic.  You may need to wear a knee brace to:  Relieve knee pain.  Help your knee support your weight (improve stability).  Help you walk farther (improve mobility).  Prevent injury.  Support your knee while it heals from surgery or from an injury. RISKS AND COMPLICATIONS Generally, knee braces are very safe to wear. However, problems may occur, including:  Skin irritation that may lead to infection.  Making your condition worse if you wear the brace in the wrong way. HOW TO USE A KNEE BRACE Different braces will have different instructions for use. Your health care provider will tell you or show you:  How to put on your brace.  How to adjust the brace.  When and how often to wear the brace.  How to remove the brace.  If you will need any assistive devices in addition to the brace, such as crutches or a cane. In general, your brace should:  Have the hinge of the brace line up with the bend of your knee.  Have straps, hooks, or tapes that fasten  snugly around your leg.  Not feel too tight or too loose. HOW TO CARE FOR A KNEE BRACE  Check your brace often for signs of damage, such as loose connections or attachments. Your knee brace may get damaged or wear out during normal use.  Wash the fabric parts of your brace with soap and water.  Read the insert that comes with your brace for other specific care instructions. SEEK MEDICAL CARE IF:  Your knee brace is too loose or too tight and you cannot adjust it.  Your knee brace causes skin redness, swelling, bruising, or irritation.  Your knee brace is not helping.  Your knee brace is making your knee pain worse.   This information is not intended to replace advice given to you by your health care provider. Make sure you discuss any questions you have with your health care provider.   Document Released: 06/24/2003 Document Revised: 12/23/2014 Document Reviewed: 07/27/2014 Elsevier Interactive Patient Education 2016 [place] Pain Joint pain, which is also called arthralgia, can be caused by many things. Joint pain often goes away when you follow your health care provider's instructions for relieving pain at home. However, joint pain can also be caused by conditions that require further treatment. Common causes of joint pain include:  Bruising in the area of the joint.  Overuse of the joint.  Wear and tear on the joints that occur with aging (osteoarthritis).  Various other forms of arthritis.  A buildup of a crystal form of uric acid in the joint (gout).  Infections of the joint (septic arthritis) or of the bone (osteomyelitis). Your health care provider may recommend medicine to help with the pain. If your joint pain continues, additional tests may be needed to diagnose your condition. HOME CARE INSTRUCTIONS Watch your condition for any changes. Follow these instructions as directed to lessen the pain that you are feeling.  Take medicines only as directed  by your health care provider.  Rest the affected area for as long as your health care provider says that you should. If directed to do so, raise the painful joint above the level of your heart while you are sitting or lying down.  Do not do things that cause or worsen pain.  If directed, apply ice to the painful area:  Put ice in a plastic bag.  Place a towel between your skin and the bag.  Leave the ice on for 20 minutes, 2-3 times per day.  Wear an elastic bandage, splint, or sling as directed by your health care provider. Loosen the elastic bandage or splint if your fingers or toes become numb and tingle, or if they turn cold and blue.  Begin exercising or stretching the affected area as directed by your health care provider. Ask your health care provider what types of exercise are safe for you.  Keep all follow-up visits as directed by your health care provider. This is important. SEEK MEDICAL CARE IF:  Your pain increases, and medicine does not help.  Your joint pain does not improve within 3 days.  You have increased bruising or swelling.  You have a fever.  You lose 10 lb (4.5 kg) or more without trying. SEEK IMMEDIATE MEDICAL CARE IF:  You are not able to move the joint.  Your fingers or toes become numb or they turn cold and blue.   This information is not intended to replace advice given to you by your health care provider. Make sure you discuss any questions you have with your health care provider.   Document Released: 04/03/2005 Document Revised: 04/24/2014 Document Reviewed: 01/13/2014 Elsevier Interactive Patient Education [employer].

## 2015-04-15 NOTE — Patient Instructions (Signed)
Go to [facility] ED Norco as needed for pain.

## 2015-10-28 ENCOUNTER — Ambulatory Visit: Payer: BLUE CROSS/BLUE SHIELD

## 2015-10-29 ENCOUNTER — Ambulatory Visit (INDEPENDENT_AMBULATORY_CARE_PROVIDER_SITE_OTHER): Payer: BLUE CROSS/BLUE SHIELD

## 2015-10-29 ENCOUNTER — Ambulatory Visit (INDEPENDENT_AMBULATORY_CARE_PROVIDER_SITE_OTHER): Payer: BLUE CROSS/BLUE SHIELD | Admitting: Family Medicine

## 2015-10-29 VITALS — BP 122/72 | HR 101 | Temp 98.1°F | Resp 17 | Ht 61.0 in | Wt 139.0 lb

## 2015-10-29 DIAGNOSIS — M25561 Pain in right knee: Secondary | ICD-10-CM | POA: Diagnosis not present

## 2015-10-29 MED ORDER — HYDROCODONE-ACETAMINOPHEN 5-325 MG PO TABS: 1.0000 | ORAL_TABLET | Freq: Four times a day (QID) | ORAL | Status: DC | PRN

## 2015-10-29 NOTE — Progress Notes (Signed)
Subjective:  By signing my name below, I, [name], attest that this documentation has been prepared under the direction and in the presence of Merri Ray, MD.  Electronically Signed: [name], ED Scribe. 10/29/2015. 10:23 AM.   Patient ID: Joan Simpson, female    DOB: 10/14/1990, 25 y.o.   MRN: [id number]  HPI   Chief Complaint  Patient presents with  . Knee Pain    right    HPI Comments: Joan Simpson is a 25 y.o. female who presents to the [facility] complaining of gradually worsening, right knee pain onset 5 years but has worsened over the past 2 weeks. She reports initial injury occurred at work 5 years ago, filed under [employer]. At that time it was suspected tear to meniscus but this was not seen on MRI. She went to PT and was followed by [facility]. Over the past 2 weeks pain has worsened with feeling and hearing popping and cracking in right knee with movement and walking. She also notes pain with sitting. States knee will occasionally lock making it difficult to walk. She has tried OTC brace, ibuprofen, and tylenol. She last had hydrocodone for lower back pain few months ago. Denies history of addiction or difficulty with narcotic pain medicines.  There are no active problems to display for this patient.  Past Medical History  Diagnosis Date  . No pertinent past medical history   . Allergy   . Anxiety   . Depression    Past Surgical History  Procedure Laterality Date  . No past surgeries     Allergies  Allergen Reactions  . Erythromycin Anaphylaxis    Says she throws up and can't breath  . Ibuprofen   . Tylenol [Acetaminophen]    Prior to Admission medications   Not on File   Social History   Social History  . Marital Status: Married    Spouse Name: N/A  . Number of Children: N/A  . Years of Education: N/A   Occupational History  . Not on file.   Social History Main Topics  . Smoking status: Never Smoker   .  Smokeless tobacco: Not on file  . Alcohol Use: Yes     Comment: occa  . Drug Use: No  . Sexual Activity: Yes   Other Topics Concern  . Not on file   Social History Narrative   Review of Systems  Musculoskeletal: Positive for arthralgias. Negative for myalgias.  Skin: Negative for color change and wound.  Neurological: Negative for weakness and numbness.   Objective:   Physical Exam  Constitutional: She is oriented to person, place, and time. She appears well-developed and well-nourished. No distress.  HENT:  Head: Normocephalic and atraumatic.  Eyes: Conjunctivae and EOM are normal.  Neck: Neck supple.  Cardiovascular: Normal rate.   Pulmonary/Chest: Effort normal.  Musculoskeletal: Normal range of motion.  Tender along lateral joint line anteriorly. Tender along patellar tendon and diffusely along anterior knee. Skin intact. No erythema. No effusion. guarded only flex 45-50 degrees. Full extension. guarded with valgus testing and minimal varus testing. Unable to do other knee testing due to guarded exam  Neurological: She is alert and oriented to person, place, and time.  Skin: Skin is warm and dry.  Psychiatric: She has a normal mood and affect. Her behavior is normal.  Nursing note and vitals reviewed.    Filed Vitals:   10/29/15 0917  BP: 122/72  Pulse: 101  Temp: 98.1  F (36.7 C)  TempSrc: Oral  Resp: 17  Height: 5\' 1"  (1.549 m)  Weight: 139 lb (63.05 kg)  SpO2: 99%    Dg Knee Complete 4 Views Right  10/29/2015  CLINICAL DATA:  Chronic right knee pain, progressing EXAM: RIGHT KNEE - COMPLETE 4+ VIEW COMPARISON:  None. FINDINGS: Frontal, lateral, and bilateral oblique views were obtained. There is no fracture or dislocation. No joint effusion. There is slight narrowing medially. Other joint spaces appear normal. No erosive change. IMPRESSION: Slight joint space narrowing medially. No fracture or joint effusion. No erosive change. Electronically Signed   By: Lowella Grip III M.D.   On: 10/29/2015 11:16   Assessment & Plan:   Joan Simpson is a 25 y.o. female Right knee pain - Plan: DG Knee Complete 4 Views Right, Ambulatory referral to Orthopedic Surgery, HYDROcodone-acetaminophen (NORCO/VICODIN) 5-325 MG tablet, Crutches, MR Knee Right Wo Contrast  - Long-standing intermittent pain, now with worsening pain past 2 weeks, difficulty with weightbearing and flexing knee, and guarded exam. Differential includes meniscal tear not initially seen, or patellofemoral pain.   -Due to amount of pain unresolved with over-the-counter anti-inflammatories, and difficulty with range of motion in knee, will arrange for MRI, refer to orthopedics, crutches as needed, ibuprofen 800 mg every 8 hours when necessary with food, and hydrocodone short-term. RTC precautions given if worsening.  Meds ordered this encounter  Medications  . HYDROcodone-acetaminophen (NORCO/VICODIN) 5-325 MG tablet    Sig: Take 1 tablet by mouth every 6 (six) hours as needed for moderate pain.    Dispense:  20 tablet    Refill:  0   Patient Instructions       IF you received an x-ray today, you will receive an invoice from [facility]. Please contact [facility] at [phone] with questions or concerns regarding your invoice.   IF you received labwork today, you will receive an invoice from [employer]. Please contact [facility] at [phone] with questions or concerns regarding your invoice.   Our billing staff will not be able to assist you with questions regarding bills from these companies.  You will be contacted with the lab results as soon as they are available. The fastest way to get your results is to activate your My Chart account. Instructions are located on the last page of this paperwork. If you have not heard from [place] regarding the results in 2 weeks, please contact this office.     I will refer you to orthopedics, and arrange  for the MRI of your knee. Crutches as needed for now if you're having pain with weightbearing. Ibuprofen up to 800 mg every 8 hours with food. Stop if any stomach upset with this medication. If needed for breakthrough pain, I did write hydrocodone to be used up to every 6 hours. Office visit would be needed for any further refills if you have not been seen by orthopedics at that time.  Return to the clinic or go to the nearest emergency room if any of your symptoms worsen or new symptoms occur.  Knee Pain Knee pain is a very common symptom and can have many causes. Knee pain often goes away when you follow your health care provider's instructions for relieving pain and discomfort at home. However, knee pain can develop into a condition that needs treatment. Some conditions may include:  Arthritis caused by wear and tear (osteoarthritis).  Arthritis caused by swelling and irritation (rheumatoid arthritis or gout).  A cyst or  growth in your knee.  An infection in your knee joint.  An injury that will not heal.  Damage, swelling, or irritation of the tissues that support your knee (torn ligaments or tendinitis). If your knee pain continues, additional tests may be ordered to diagnose your condition. Tests may include X-rays or other imaging studies of your knee. You may also need to have fluid removed from your knee. Treatment for ongoing knee pain depends on the cause, but treatment may include:  Medicines to relieve pain or swelling.  Steroid injections in your knee.  Physical therapy.  Surgery. HOME CARE INSTRUCTIONS  Take medicines only as directed by your health care provider.  Rest your knee and keep it raised (elevated) while you are resting.  Do not do things that cause or worsen pain.  Avoid high-impact activities or exercises, such as running, jumping rope, or doing jumping jacks.  Apply ice to the knee area:  Put ice in a plastic bag.  Place a towel between your skin  and the bag.  Leave the ice on for 20 minutes, 2-3 times a day.  Ask your health care provider if you should wear an elastic knee support.  Keep a pillow under your knee when you sleep.  Lose weight if you are overweight. Extra weight can put pressure on your knee.  Do not use any tobacco products, including cigarettes, chewing tobacco, or electronic cigarettes. If you need help quitting, ask your health care provider. Smoking may slow the healing of any bone and joint problems that you may have. SEEK MEDICAL CARE IF:  Your knee pain continues, changes, or gets worse.  You have a fever along with knee pain.  Your knee buckles or locks up.  Your knee becomes more swollen. SEEK IMMEDIATE MEDICAL CARE IF:   Your knee joint feels hot to the touch.  You have chest pain or trouble breathing.   This information is not intended to replace advice given to you by your health care provider. Make sure you discuss any questions you have with your health care provider.   Document Released: 01/29/2007 Document Revised: 04/24/2014 Document Reviewed: 11/17/2013 Elsevier Interactive Patient Education [employer].      I personally performed the services described in this documentation, which was scribed in my presence. The recorded information has been reviewed and considered, and addended by me as needed.   Signed,   Merri Ray, MD [facility].  10/29/2015 11:37 AM

## 2015-10-29 NOTE — Patient Instructions (Addendum)
IF you received an x-ray today, you will receive an invoice from [facility]. Please contact [facility] at [phone] with questions or concerns regarding your invoice.   IF you received labwork today, you will receive an invoice from [employer]. Please contact [facility] at [phone] with questions or concerns regarding your invoice.   Our billing staff will not be able to assist you with questions regarding bills from these companies.  You will be contacted with the lab results as soon as they are available. The fastest way to get your results is to activate your My Chart account. Instructions are located on the last page of this paperwork. If you have not heard from [place] regarding the results in 2 weeks, please contact this office.     I will refer you to orthopedics, and arrange for the MRI of your knee. Crutches as needed for now if you're having pain with weightbearing. Ibuprofen up to 800 mg every 8 hours with food. Stop if any stomach upset with this medication. If needed for breakthrough pain, I did write hydrocodone to be used up to every 6 hours. Office visit would be needed for any further refills if you have not been seen by orthopedics at that time.  Return to the clinic or go to the nearest emergency room if any of your symptoms worsen or new symptoms occur.  Knee Pain Knee pain is a very common symptom and can have many causes. Knee pain often goes away when you follow your health care provider's instructions for relieving pain and discomfort at home. However, knee pain can develop into a condition that needs treatment. Some conditions may include:  Arthritis caused by wear and tear (osteoarthritis).  Arthritis caused by swelling and irritation (rheumatoid arthritis or gout).  A cyst or growth in your knee.  An infection in your knee joint.  An injury that will not heal.  Damage, swelling, or irritation of the tissues  that support your knee (torn ligaments or tendinitis). If your knee pain continues, additional tests may be ordered to diagnose your condition. Tests may include X-rays or other imaging studies of your knee. You may also need to have fluid removed from your knee. Treatment for ongoing knee pain depends on the cause, but treatment may include:  Medicines to relieve pain or swelling.  Steroid injections in your knee.  Physical therapy.  Surgery. HOME CARE INSTRUCTIONS  Take medicines only as directed by your health care provider.  Rest your knee and keep it raised (elevated) while you are resting.  Do not do things that cause or worsen pain.  Avoid high-impact activities or exercises, such as running, jumping rope, or doing jumping jacks.  Apply ice to the knee area:  Put ice in a plastic bag.  Place a towel between your skin and the bag.  Leave the ice on for 20 minutes, 2-3 times a day.  Ask your health care provider if you should wear an elastic knee support.  Keep a pillow under your knee when you sleep.  Lose weight if you are overweight. Extra weight can put pressure on your knee.  Do not use any tobacco products, including cigarettes, chewing tobacco, or electronic cigarettes. If you need help quitting, ask your health care provider. Smoking may slow the healing of any bone and joint problems that you may have. SEEK MEDICAL CARE IF:  Your knee pain continues, changes, or gets worse.  You have a fever along  with knee pain.  Your knee buckles or locks up.  Your knee becomes more swollen. SEEK IMMEDIATE MEDICAL CARE IF:   Your knee joint feels hot to the touch.  You have chest pain or trouble breathing.   This information is not intended to replace advice given to you by your health care provider. Make sure you discuss any questions you have with your health care provider.   Document Released: 01/29/2007 Document Revised: 04/24/2014 Document Reviewed:  11/17/2013 Elsevier Interactive Patient Education [employer].

## 2016-01-06 ENCOUNTER — Encounter (HOSPITAL_COMMUNITY): Payer: Self-pay | Admitting: Emergency Medicine

## 2016-01-06 ENCOUNTER — Ambulatory Visit (HOSPITAL_COMMUNITY)
Admission: EM | Admit: 2016-01-06 | Discharge: 2016-01-06 | Disposition: A | Discharge status: Home / Self Care | Payer: BLUE CROSS/BLUE SHIELD | Attending: Internal Medicine | Admitting: Internal Medicine

## 2016-01-06 DIAGNOSIS — J069 Acute upper respiratory infection, unspecified: Secondary | ICD-10-CM | POA: Diagnosis not present

## 2016-01-06 DIAGNOSIS — F419 Anxiety disorder, unspecified: Secondary | ICD-10-CM | POA: Insufficient documentation

## 2016-01-06 DIAGNOSIS — F329 Major depressive disorder, single episode, unspecified: Secondary | ICD-10-CM | POA: Insufficient documentation

## 2016-01-06 DIAGNOSIS — J029 Acute pharyngitis, unspecified: Secondary | ICD-10-CM | POA: Diagnosis not present

## 2016-01-06 LAB — POCT RAPID STREP A: STREPTOCOCCUS, GROUP A SCREEN (DIRECT): NEGATIVE

## 2016-01-06 NOTE — ED Triage Notes (Signed)
Patient complains of sore throat, concerned for strep.  Onset Sunday of symptoms.  Reports fever of 101 on sunday

## 2016-01-06 NOTE — Discharge Instructions (Signed)
°  You may take 400-600mg  Ibuprofen (Motrin) every 6-8 hours for fever and pain  Alternate with Tylenol  You may take 500mg  Tylenol every 4-6 hours as needed for fever and pain  Follow-up with your primary care provider next week for recheck of symptoms if not improving.  Be sure to drink plenty of fluids and rest, at least 8hrs of sleep a night, preferably more while you are sick. Return urgent care or go to closest ER if you cannot keep down fluids/signs of dehydration, fever not reducing with Tylenol, difficulty breathing/wheezing, stiff neck, worsening condition, or other concerns (see below)   Your rapid strep test was Negative today, however, a more detailed test known as a culture has been sent to the lab for more testing. This takes about 24-48 hours.  If the culture comes back Positive, we will let you know and have an antibiotic called into the pharmacy for you.  If it is negative, your symptoms are likely from a virus and should resolve within 1 week.  If you develop difficulty breathing or swallowing liquids please go to closest emergency department for further evaluation and treatment.

## 2016-01-06 NOTE — ED Provider Notes (Signed)
CSN: [id number]     Arrival date & time 01/06/16  1539 History   First MD Initiated Contact with Patient 01/06/16 1627     Chief Complaint  Patient presents with  . Sore Throat   (Consider location/radiation/quality/duration/timing/severity/associated sxs/prior Treatment) HPI Joan Simpson is a 25 y.o. female presenting to UC with c/o sore throat with associated cough and fever of 101*F earlier in the week.  She also reports mild fatigue.  Throat pain is 6/10, worse with swallowing.  She has taken some OTC pain medication with mild temporary relief. Denies ear pain.  Mild nausea but no vomiting or diarrhea. No sick contacts or recent travel. She has been able to keep down PO fluids.   Past Medical History:  Diagnosis Date  . Allergy   . Anxiety   . Depression   . No pertinent past medical history    Past Surgical History:  Procedure Laterality Date  . NO PAST SURGERIES     No family history on file. Social History  Substance Use Topics  . Smoking status: Never Smoker  . Smokeless tobacco: Not on file  . Alcohol use Yes     Comment: occa   OB History    Gravida Para Term Preterm AB Living   1         0   SAB TAB Ectopic Multiple Live Births                 Review of Systems  Constitutional: Positive for chills, fatigue and fever.  HENT: Positive for congestion and sore throat. Negative for ear pain, rhinorrhea and sneezing.   Respiratory: Positive for cough. Negative for shortness of breath.   Gastrointestinal: Positive for nausea. Negative for abdominal pain, diarrhea and vomiting.  Neurological: Negative for dizziness, light-headedness and headaches.    Allergies  Erythromycin  Home Medications   Prior to Admission medications   Medication Sig Start Date End Date Taking? Authorizing Provider  HYDROcodone-acetaminophen (NORCO/VICODIN) 5-325 MG tablet Take 1 tablet by mouth every 6 (six) hours as needed for moderate pain. 10/29/15   Wendie Agreste, MD   Meds  Ordered and Administered this Visit  Medications - No data to display  BP 133/80 (BP Location: Left Arm)   Pulse 84   Temp 98.4 F (36.9 C) (Oral)   Resp 16   SpO2 100%  No data found.   Physical Exam  Constitutional: She appears well-developed and well-nourished. No distress.  HENT:  Head: Normocephalic and atraumatic.  Right Ear: Tympanic membrane normal.  Left Ear: Tympanic membrane normal.  Nose: Nose normal.  Mouth/Throat: Uvula is midline and mucous membranes are normal. Posterior oropharyngeal edema and posterior oropharyngeal erythema present. No oropharyngeal exudate or tonsillar abscesses.  Eyes: Conjunctivae are normal. No scleral icterus.  Neck: Normal range of motion. Neck supple.  Cardiovascular: Normal rate, regular rhythm and normal heart sounds.   Pulmonary/Chest: Effort normal and breath sounds normal. No stridor. No respiratory distress. She has no wheezes. She has no rales.  Abdominal: Soft. She exhibits no distension. There is no tenderness.  Musculoskeletal: Normal range of motion.  Lymphadenopathy:    She has no cervical adenopathy.  Neurological: She is alert.  Skin: Skin is warm and dry. She is not diaphoretic.  Nursing note and vitals reviewed.   Urgent Care Course   Clinical Course    Procedures (including critical care time)  Labs Review Labs Reviewed  POCT RAPID STREP A    Imaging Review No  results found.    MDM   1. Upper respiratory infection   2. Viral pharyngitis    Pt c/o sore throat with cough and fever of 101*F earlier this week.   Rapid strep: Negative Culture sent.  Encouraged symptomatic treatment.  Advised pt to use acetaminophen and ibuprofen as needed for fever and pain. Encouraged rest and fluids. F/u with PCP in 7-10 days if not improving, sooner if worsening. Pt verbalized understanding and agreement with tx plan.     Noland Fordyce, PA-C 01/06/16 1710

## 2016-01-08 LAB — CULTURE, GROUP A STREP (THRC)

## 2016-03-06 ENCOUNTER — Ambulatory Visit (HOSPITAL_COMMUNITY)
Admission: EM | Admit: 2016-03-06 | Discharge: 2016-03-06 | Disposition: A | Discharge status: Home / Self Care | Payer: BLUE CROSS/BLUE SHIELD | Attending: Emergency Medicine | Admitting: Emergency Medicine

## 2016-03-06 ENCOUNTER — Encounter (HOSPITAL_COMMUNITY): Payer: Self-pay | Admitting: Emergency Medicine

## 2016-03-06 DIAGNOSIS — R6889 Other general symptoms and signs: Secondary | ICD-10-CM | POA: Diagnosis not present

## 2016-03-06 DIAGNOSIS — J029 Acute pharyngitis, unspecified: Secondary | ICD-10-CM | POA: Insufficient documentation

## 2016-03-06 DIAGNOSIS — R05 Cough: Secondary | ICD-10-CM | POA: Diagnosis not present

## 2016-03-06 HISTORY — DX: Essential (primary) hypertension: I10

## 2016-03-06 LAB — POCT RAPID STREP A: Streptococcus, Group A Screen (Direct): NEGATIVE

## 2016-03-06 MED ORDER — ONDANSETRON 4 MG PO TBDP
4.0000 mg | ORAL_TABLET | Freq: Once | ORAL | Status: AC
  Administered 2016-03-06: 4 mg via ORAL

## 2016-03-06 MED ORDER — AMOXICILLIN 500 MG PO CAPS: 500.0000 mg | ORAL_CAPSULE | Freq: Three times a day (TID) | ORAL | 0 refills | Status: DC

## 2016-03-06 MED ORDER — ONDANSETRON HCL 4 MG PO TABS: 4.0000 mg | ORAL_TABLET | Freq: Four times a day (QID) | ORAL | 0 refills | Status: DC

## 2016-03-06 MED ORDER — BENZONATATE 100 MG PO CAPS: 100.0000 mg | ORAL_CAPSULE | Freq: Three times a day (TID) | ORAL | 0 refills | Status: DC

## 2016-03-06 MED ORDER — ONDANSETRON 4 MG PO TBDP
ORAL_TABLET | ORAL | Status: AC
  Filled 2016-03-06: qty 1

## 2016-03-06 MED ORDER — ACETAMINOPHEN 325 MG PO TABS
650.0000 mg | ORAL_TABLET | Freq: Once | ORAL | Status: AC
  Administered 2016-03-06: 650 mg via ORAL

## 2016-03-06 MED ORDER — ACETAMINOPHEN 325 MG PO TABS
ORAL_TABLET | ORAL | Status: AC
  Filled 2016-03-06: qty 2

## 2016-03-06 NOTE — ED Triage Notes (Signed)
Pt reports productive cough, congestion, hoarse voice, chills for one week.   PT reports bilateral ear pain and fullness.

## 2016-03-06 NOTE — Discharge Instructions (Signed)
°  Your symptoms are likely due to a viral illness.  You may continue to try over the counter cough and congestion medications as well as Tessalon cough pearls and zofran for nausea.  If cough not improving in 2-3 days, or fever persist, you may start taking the antibiotic, amoxicillin.  Be sure to take the entire course of antibiotic to help insure infection does not come back.   Please follow up with your primary care provider in 1 week if not improving, sooner if worsening.

## 2016-03-06 NOTE — ED Provider Notes (Signed)
CSN: [id number]     Arrival date & time 03/06/16  1827 History   First MD Initiated Contact with Patient 03/06/16 2003     Chief Complaint  Patient presents with  . Cough   (Consider location/radiation/quality/duration/timing/severity/associated sxs/prior Treatment) HPI  Joan Simpson is a 25 y.o. female presenting to UC with c/o flu-like symptoms for about 1 week.  She reports having a moderately productive cough, congestion, hoarse voice, sore throat, fever Tmax 105*F, chills, fatigue, and bilateral ear pain and fullness.  She did not get the flu vaccine this season.  Husband was sick last week but for only 3 days. Nausea but no vomiting.     Past Medical History:  Diagnosis Date  . Allergy   . Anxiety   . Depression   . Hypertension   . No pertinent past medical history    Past Surgical History:  Procedure Laterality Date  . NO PAST SURGERIES     No family history on file. Social History  Substance Use Topics  . Smoking status: Never Smoker  . Smokeless tobacco: Never Used  . Alcohol use No     Comment: occa   OB History    Gravida Para Term Preterm AB Living   1         0   SAB TAB Ectopic Multiple Live Births                 Review of Systems  Constitutional: Positive for chills, fatigue and fever.  HENT: Positive for congestion, ear pain ( bilateral), rhinorrhea and sore throat. Negative for trouble swallowing and voice change.   Respiratory: Positive for cough. Negative for shortness of breath.   Cardiovascular: Negative for chest pain and palpitations.  Gastrointestinal: Positive for diarrhea and nausea. Negative for abdominal pain and vomiting.  Musculoskeletal: Negative for arthralgias, back pain and myalgias.  Skin: Negative for rash.    Allergies  Erythromycin  Home Medications   Prior to Admission medications   Medication Sig Start Date End Date Taking? Authorizing Provider  lamoTRIgine (LAMICTAL) 100 MG tablet Take 25 mg by mouth 4 (four)  times daily.   Yes Historical Provider, MD  sertraline (ZOLOFT) 100 MG tablet Take 100 mg by mouth daily.   Yes Historical Provider, MD  amoxicillin (AMOXIL) 500 MG capsule Take 1 capsule (500 mg total) by mouth 3 (three) times daily. 03/06/16   Noland Fordyce, PA-C  benzonatate (TESSALON) 100 MG capsule Take 1 capsule (100 mg total) by mouth every 8 (eight) hours. 03/06/16   Noland Fordyce, PA-C  HYDROcodone-acetaminophen (NORCO/VICODIN) 5-325 MG tablet Take 1 tablet by mouth every 6 (six) hours as needed for moderate pain. 10/29/15   Wendie Agreste, MD  ondansetron (ZOFRAN) 4 MG tablet Take 1 tablet (4 mg total) by mouth every 6 (six) hours. 03/06/16   Noland Fordyce, PA-C   Meds Ordered and Administered this Visit   Medications  acetaminophen (TYLENOL) tablet 650 mg (650 mg Oral Given 03/06/16 2031)  ondansetron (ZOFRAN-ODT) disintegrating tablet 4 mg (4 mg Oral Given 03/06/16 2031)    BP 124/79   Pulse 93   Temp 98.2 F (36.8 C) (Oral)   Resp 16   Ht 4\' 11"  (1.499 m)   Wt 140 lb (63.5 kg)   SpO2 100%   BMI 28.28 kg/m  No data found.   Physical Exam  Constitutional: She appears well-developed and well-nourished. No distress.  Pt sitting on exam bed, bundled in her jacket, shivering.  Appears  mildly fatigued and uncomfortable.   HENT:  Head: Normocephalic and atraumatic.  Right Ear: Tympanic membrane normal.  Left Ear: Tympanic membrane normal.  Nose: Nose normal.  Mouth/Throat: Uvula is midline and mucous membranes are normal. Posterior oropharyngeal erythema present. No oropharyngeal exudate or posterior oropharyngeal edema.  Eyes: Conjunctivae are normal. No scleral icterus.  Neck: Normal range of motion. Neck supple.  Cardiovascular: Normal rate, regular rhythm and normal heart sounds.   Pulmonary/Chest: Effort normal and breath sounds normal. No stridor. No respiratory distress. She has no wheezes. She has no rales.  Abdominal: Soft. She exhibits no distension. There is  no tenderness.  Musculoskeletal: Normal range of motion.  Lymphadenopathy:    She has no cervical adenopathy.  Neurological: She is alert.  Skin: Skin is warm and dry. She is not diaphoretic.  Nursing note and vitals reviewed.   Urgent Care Course   Clinical Course     Procedures (including critical care time)  Labs Review Labs Reviewed  POCT RAPID STREP A    Imaging Review No results found.   MDM   1. Flu-like symptoms    Pt presenting with flu-like symptoms for about 1 week.  Pt requested test for strep. Rapid strep: Negative.  Lungs: CTAB Pt is outside of recommended treatment for flu. Encouraged symptomatic treatment. Rx: Zofran, tessalon. Prescription to hold for amoxicillin. If fever persists or cough worsens, may start amoxicillin F/u with PCP in 1 week if not improving, sooner if worsening.    Noland Fordyce, PA-C 03/06/16 2119

## 2016-03-08 LAB — CULTURE, GROUP A STREP (THRC)

## 2016-04-21 ENCOUNTER — Encounter (HOSPITAL_COMMUNITY): Payer: Self-pay | Admitting: Emergency Medicine

## 2016-04-21 ENCOUNTER — Emergency Department (HOSPITAL_COMMUNITY)
Admission: EM | Admit: 2016-04-21 | Discharge: 2016-04-21 | Disposition: A | Discharge status: Home / Self Care | Payer: BLUE CROSS/BLUE SHIELD | Attending: Emergency Medicine | Admitting: Emergency Medicine

## 2016-04-21 ENCOUNTER — Emergency Department (HOSPITAL_COMMUNITY): Payer: BLUE CROSS/BLUE SHIELD

## 2016-04-21 DIAGNOSIS — G43909 Migraine, unspecified, not intractable, without status migrainosus: Secondary | ICD-10-CM | POA: Diagnosis present

## 2016-04-21 DIAGNOSIS — I10 Essential (primary) hypertension: Secondary | ICD-10-CM | POA: Diagnosis not present

## 2016-04-21 DIAGNOSIS — J069 Acute upper respiratory infection, unspecified: Secondary | ICD-10-CM | POA: Diagnosis not present

## 2016-04-21 DIAGNOSIS — Z79899 Other long term (current) drug therapy: Secondary | ICD-10-CM | POA: Diagnosis not present

## 2016-04-21 DIAGNOSIS — R51 Headache: Secondary | ICD-10-CM | POA: Insufficient documentation

## 2016-04-21 DIAGNOSIS — R519 Headache, unspecified: Secondary | ICD-10-CM

## 2016-04-21 DIAGNOSIS — B9789 Other viral agents as the cause of diseases classified elsewhere: Secondary | ICD-10-CM

## 2016-04-21 LAB — COMPREHENSIVE METABOLIC PANEL
ALBUMIN: 4 g/dL (ref 3.5–5.0)
ALT: 19 U/L (ref 14–54)
ANION GAP: 10 (ref 5–15)
AST: 21 U/L (ref 15–41)
Alkaline Phosphatase: 51 U/L (ref 38–126)
BILIRUBIN TOTAL: 0.4 mg/dL (ref 0.3–1.2)
BUN: 9 mg/dL (ref 6–20)
CHLORIDE: 106 mmol/L (ref 101–111)
CO2: 22 mmol/L (ref 22–32)
Calcium: 9.4 mg/dL (ref 8.9–10.3)
Creatinine, Ser: 0.79 mg/dL (ref 0.44–1.00)
GFR calc Af Amer: >60 mL/min (ref >60)
GLUCOSE: 92 mg/dL (ref 65–99)
POTASSIUM: 3.4 mmol/L — AB (ref 3.5–5.1)
Sodium: 138 mmol/L (ref 135–145)
TOTAL PROTEIN: 7.5 g/dL (ref 6.5–8.1)

## 2016-04-21 LAB — URINALYSIS, ROUTINE W REFLEX MICROSCOPIC
Bilirubin Urine: NEGATIVE
GLUCOSE, UA: NEGATIVE mg/dL
Hgb urine dipstick: NEGATIVE
Ketones, ur: 5 mg/dL — AB
Nitrite: NEGATIVE
PH: 5 (ref 5.0–8.0)
Protein, ur: NEGATIVE mg/dL
Specific Gravity, Urine: 1.014 (ref 1.005–1.030)

## 2016-04-21 LAB — CBC
HEMATOCRIT: 36.2 % (ref 36.0–46.0)
Hemoglobin: 12.4 g/dL (ref 12.0–15.0)
MCH: 29.4 pg (ref 26.0–34.0)
MCHC: 34.3 g/dL (ref 30.0–36.0)
MCV: 85.8 fL (ref 78.0–100.0)
PLATELETS: 391 10*3/uL (ref 150–400)
RBC: 4.22 MIL/uL (ref 3.87–5.11)
RDW: 15 % (ref 11.5–15.5)
WBC: 9.9 10*3/uL (ref 4.0–10.5)

## 2016-04-21 LAB — I-STAT BETA HCG BLOOD, ED (MC, WL, AP ONLY): I-stat hCG, quantitative: <5 m[IU]/mL (ref <5)

## 2016-04-21 LAB — LIPASE, BLOOD: LIPASE: 23 U/L (ref 11–51)

## 2016-04-21 MED ORDER — KETOROLAC TROMETHAMINE 30 MG/ML IJ SOLN
30.0000 mg | Freq: Once | INTRAMUSCULAR | Status: AC
  Administered 2016-04-21: 30 mg via INTRAVENOUS
  Filled 2016-04-21: qty 1

## 2016-04-21 MED ORDER — BUTALBITAL-APAP-CAFFEINE 50-325-40 MG PO TABS: 1.0000 | ORAL_TABLET | Freq: Four times a day (QID) | ORAL | 0 refills | Status: AC | PRN

## 2016-04-21 MED ORDER — DEXAMETHASONE SODIUM PHOSPHATE 10 MG/ML IJ SOLN
10.0000 mg | Freq: Once | INTRAMUSCULAR | Status: AC
  Administered 2016-04-21: 10 mg via INTRAVENOUS
  Filled 2016-04-21: qty 1

## 2016-04-21 MED ORDER — PROCHLORPERAZINE EDISYLATE 5 MG/ML IJ SOLN
10.0000 mg | Freq: Once | INTRAMUSCULAR | Status: AC
  Administered 2016-04-21: 10 mg via INTRAVENOUS
  Filled 2016-04-21: qty 2

## 2016-04-21 MED ORDER — SODIUM CHLORIDE 0.9 % IV BOLUS (SEPSIS)
2000.0000 mL | INTRAVENOUS | Status: AC
  Administered 2016-04-21: 2000 mL via INTRAVENOUS

## 2016-04-21 MED ORDER — DIPHENHYDRAMINE HCL 50 MG/ML IJ SOLN
12.5000 mg | Freq: Once | INTRAMUSCULAR | Status: AC
  Administered 2016-04-21: 12.5 mg via INTRAVENOUS
  Filled 2016-04-21: qty 1

## 2016-04-21 NOTE — ED Provider Notes (Signed)
[facility] DEPT Provider Note   CSN: [id number] Arrival date & time: 04/21/16  [id number]     History   Chief Complaint Chief Complaint  Patient presents with  . Migraine    HPI Joan Simpson is a 26 y.o. female with a hx of anxiety, depression, HTN, migraine headache presents to the Emergency Department complaining of Waxing and waning migraine headache onset 2 weeks ago with acute worsening in the last 24 hours. Patient reports she has had associated photophobia, phonophobia, nausea and vomiting. She states she's been unable to hold down any food or fluids lost 24 hours and has not urinated. She reports her migraines are managed by her primary care and she usually takes acetaminophen or ibuprofen but neither of these have helped this time. Patient has never seen a neurologist for her headaches. She reports that today's headache is similar to previous headaches. She denies thunderclap or worse headache of her life. She denies syncope or near syncope. She does also report a URI symptoms onset 2 days ago including cough, nasal congestion, ear fullness and postnasal drip. She reports chills but no fever. Nothing makes her symptoms better or worse.    The history is provided by the patient and medical records. No language interpreter was used.    Past Medical History:  Diagnosis Date  . Allergy   . Anxiety   . Depression   . Hypertension   . No pertinent past medical history     There are no active problems to display for this patient.   Past Surgical History:  Procedure Laterality Date  . NO PAST SURGERIES      OB History    Gravida Para Term Preterm AB Living   1         0   SAB TAB Ectopic Multiple Live Births                   Home Medications    Prior to Admission medications   Medication Sig Start Date End Date Taking? Authorizing Provider  acetaminophen (TYLENOL) 325 MG tablet Take 1,300 mg by mouth every 6 (six) hours as needed for moderate pain.   Yes  Historical Provider, MD  ARIPiprazole (ABILIFY) 5 MG tablet Take 2.5 mg by mouth daily.   Yes Historical Provider, MD  Armodafinil 250 MG tablet Take 250 mg by mouth daily.   Yes Historical Provider, MD  etonogestrel (IMPLANON) 68 MG IMPL implant 1 each by Subdermal route once.   Yes Historical Provider, MD  ibuprofen (ADVIL,MOTRIN) 200 MG tablet Take 200-800 mg by mouth every 6 (six) hours as needed for moderate pain.   Yes Historical Provider, MD    Family History No family history on file.  Social History Social History  Substance Use Topics  . Smoking status: Never Smoker  . Smokeless tobacco: Never Used  . Alcohol use No     Comment: occa     Allergies   Erythromycin   Review of Systems Review of Systems  HENT: Positive for ear pain, postnasal drip, sinus pain and sinus pressure.   Eyes: Positive for photophobia.  Respiratory: Positive for cough.   Gastrointestinal: Positive for nausea and vomiting.  Neurological: Positive for headaches.  All other systems reviewed and are negative.    Physical Exam Updated Vital Signs BP 115/69 (BP Location: Right Arm)   Pulse (!) 123   Temp 98.8 F (37.1 C) (Oral)   Resp 16   Ht 5\' 1"  (1.549 m)  Wt 65.8 kg   SpO2 98%   BMI 27.40 kg/m   Physical Exam  Constitutional: She is oriented to person, place, and time. She appears well-developed and well-nourished. No distress.  HENT:  Head: Normocephalic and atraumatic.  Right Ear: Tympanic membrane, external ear and ear canal normal.  Left Ear: Tympanic membrane, external ear and ear canal normal.  Nose: Mucosal edema and rhinorrhea present. No epistaxis. Right sinus exhibits no maxillary sinus tenderness and no frontal sinus tenderness. Left sinus exhibits no maxillary sinus tenderness and no frontal sinus tenderness.  Mouth/Throat: Uvula is midline, oropharynx is clear and moist and mucous membranes are normal. Mucous membranes are not pale and not cyanotic. No oropharyngeal  exudate, posterior oropharyngeal edema, posterior oropharyngeal erythema or tonsillar abscesses.  Eyes: Conjunctivae and EOM are normal. Pupils are equal, round, and reactive to light. No scleral icterus.  No horizontal, vertical or rotational nystagmus  Neck: Normal range of motion and full passive range of motion without pain. Neck supple.  Full active and passive ROM without pain No midline or paraspinal tenderness No nuchal rigidity or meningeal signs  Cardiovascular: Normal rate, regular rhythm and intact distal pulses.   Pulmonary/Chest: Effort normal and breath sounds normal. No stridor. No respiratory distress. She has no wheezes. She has no rales.  Clear and equal breath sounds without focal wheezes, rhonchi, rales  Abdominal: Soft. Bowel sounds are normal. There is no tenderness. There is no rebound and no guarding.  Musculoskeletal: Normal range of motion.  Lymphadenopathy:    She has no cervical adenopathy.  Neurological: She is alert and oriented to person, place, and time. No cranial nerve deficit. She exhibits normal muscle tone. Coordination normal.  Mental Status:  Alert, oriented, thought content appropriate. Speech fluent without evidence of aphasia. Able to follow 2 step commands without difficulty.  Cranial Nerves:  II:  Peripheral visual fields grossly normal, pupils equal, round, reactive to light III,IV, VI: ptosis not present, extra-ocular motions intact bilaterally  V,VII: smile symmetric, facial light touch sensation equal VIII: hearing grossly normal bilaterally  IX,X: midline uvula rise  XI: bilateral shoulder shrug equal and strong XII: midline tongue extension  Motor:  5/5 in upper and lower extremities bilaterally including strong and equal grip strength and dorsiflexion/plantar flexion Sensory: Pinprick and light touch normal in all extremities.  Cerebellar: normal finger-to-nose with bilateral upper extremities Gait: normal gait and balance CV: distal  pulses palpable throughout   Skin: Skin is warm and dry. No rash noted. She is not diaphoretic.  Psychiatric: She has a normal mood and affect. Her behavior is normal. Judgment and thought content normal.  Nursing note and vitals reviewed.    ED Treatments / Results  Labs (all labs ordered are listed, but only abnormal results are displayed) Labs Reviewed  COMPREHENSIVE METABOLIC PANEL - Abnormal; Notable for the following:       Result Value   Potassium 3.4 (*)    All other components within normal limits  CBC  LIPASE, BLOOD  URINALYSIS, ROUTINE W REFLEX MICROSCOPIC  I-STAT BETA HCG BLOOD, ED ([facility], [facility], [facility] ONLY)   Radiology Dg Chest 2 View  Result Date: 04/21/2016 CLINICAL DATA:  Headache and upper respiratory infection for 2 weeks EXAM: CHEST  2 VIEW COMPARISON:  04/16/2015 FINDINGS: The heart size and mediastinal contours are within normal limits. Both lungs are clear. The visualized skeletal structures are unremarkable. IMPRESSION: No active cardiopulmonary disease. Electronically Signed   By: Andreas Newport M.D.   On:  04/21/2016 06:23    Procedures Procedures (including critical care time)  Medications Ordered in ED Medications  sodium chloride 0.9 % bolus 2,000 mL (2,000 mLs Intravenous New Bag/Given 04/21/16 [id number])  ketorolac (TORADOL) 30 MG/ML injection 30 mg (30 mg Intravenous Given 04/21/16 0603)  prochlorperazine (COMPAZINE) injection 10 mg (10 mg Intravenous Given 04/21/16 0602)  diphenhydrAMINE (BENADRYL) injection 12.5 mg (12.5 mg Intravenous Given 04/21/16 0602)  dexamethasone (DECADRON) injection 10 mg (10 mg Intravenous Given 04/21/16 0603)     Initial Impression / Assessment and Plan / ED Course  I have reviewed the triage vital signs and the nursing notes.  Pertinent labs & imaging results that were available during my care of the patient were reviewed by me and considered in my medical decision making (see chart for details).  Clinical Course     Patient  presents with persistent headache which she describes as typical for her migraines. This headache has been more persistent than previous.  Nonfocal neuro exam. Will give migraine cocktail. Patient will need follow-up with neurology upon discharge. Patient also with URI symptoms. Basic blood work and chest x-ray reassuring. No evidence of infection. Patient does report dehydration without urination in the last 24 hours. UA pending. Serum creatinine normal.  At shift change care was transferred to Domenic Moras, PA-C who will follow pending studies, re-evaulate and determine disposition.    Final Clinical Impressions(s) / ED Diagnoses   Final diagnoses:  Bad headache  Viral URI with cough    New Prescriptions New Prescriptions   No medications on file     Abigail Butts, PA-C 04/21/16 [place], MD 04/21/16 [phone]

## 2016-04-21 NOTE — ED Triage Notes (Signed)
Pt states she's had a headache for the last 2 weeks off and on. The headache worsened last night about 11 am and has been unable to eat or sleep since then. Pt has attempted to drink and eat and becomes nauseated.

## 2016-04-21 NOTE — ED Provider Notes (Signed)
Patient here with recurrent headache. Headache improves with migraine cocktail. She was able to urinate and urine shows no signs of infection. Labs are reassuring. Patient is amenable to follow-up with neurology for further evaluation for headache. Fioricet prescribed for headache as needed.  Upon discharge, it is noted that patient is mildly tachycardic and mildly hypotensive. Patient states she was always  tachycardic and this is not unusual for her. No other infectious symptoms. Patient states she feels comfortable going home. Return precaution discussed  BP 96/63 (BP Location: Right Arm)   Pulse 102   Temp 98.8 F (37.1 C) (Oral)   Resp 16   Ht 5\' 1"  (1.549 m)   Wt 65.8 kg   SpO2 100%   BMI 27.40 kg/m   Results for orders placed or performed during the hospital encounter of 04/21/16  CBC  Result Value Ref Range   WBC 9.9 4.0 - 10.5 K/uL   RBC 4.22 3.87 - 5.11 MIL/uL   Hemoglobin 12.4 12.0 - 15.0 g/dL   HCT 36.2 36.0 - 46.0 %   MCV 85.8 78.0 - 100.0 fL   MCH 29.4 26.0 - 34.0 pg   MCHC 34.3 30.0 - 36.0 g/dL   RDW 15.0 11.5 - 15.5 %   Platelets 391 150 - 400 K/uL  Comprehensive metabolic panel  Result Value Ref Range   Sodium 138 135 - 145 mmol/L   Potassium 3.4 (L) 3.5 - 5.1 mmol/L   Chloride 106 101 - 111 mmol/L   CO2 22 22 - 32 mmol/L   Glucose, Bld 92 65 - 99 mg/dL   BUN 9 6 - 20 mg/dL   Creatinine, Ser 0.79 0.44 - 1.00 mg/dL   Calcium 9.4 8.9 - 10.3 mg/dL   Total Protein 7.5 6.5 - 8.1 g/dL   Albumin 4.0 3.5 - 5.0 g/dL   AST 21 15 - 41 U/L   ALT 19 14 - 54 U/L   Alkaline Phosphatase 51 38 - 126 U/L   Total Bilirubin 0.4 0.3 - 1.2 mg/dL   GFR calc non Af Amer >60 >60 mL/min   GFR calc Af Amer >60 >60 mL/min   Anion gap 10 5 - 15  Lipase, blood  Result Value Ref Range   Lipase 23 11 - 51 U/L  Urinalysis, Routine w reflex microscopic  Result Value Ref Range   Color, Urine YELLOW YELLOW   APPearance HAZY (A) CLEAR   Specific Gravity, Urine 1.014 1.005 - 1.030   pH 5.0 5.0 - 8.0   Glucose, UA NEGATIVE NEGATIVE mg/dL   Hgb urine dipstick NEGATIVE NEGATIVE   Bilirubin Urine NEGATIVE NEGATIVE   Ketones, ur 5 (A) NEGATIVE mg/dL   Protein, ur NEGATIVE NEGATIVE mg/dL   Nitrite NEGATIVE NEGATIVE   Leukocytes, UA SMALL (A) NEGATIVE   RBC / HPF 0-5 0 - 5 RBC/hpf   WBC, UA 6-30 0 - 5 WBC/hpf   Bacteria, UA RARE (A) NONE SEEN   Squamous Epithelial / LPF 0-5 (A) NONE SEEN   Mucous PRESENT   I-Stat Beta hCG blood, ED ([facility], [facility], [facility] only)  Result Value Ref Range   I-stat hCG, quantitative <5.0 <5 mIU/mL   Comment 3           Dg Chest 2 View  Result Date: 04/21/2016 CLINICAL DATA:  Headache and upper respiratory infection for 2 weeks EXAM: CHEST  2 VIEW COMPARISON:  04/16/2015 FINDINGS: The heart size and mediastinal contours are within normal limits. Both lungs are clear. The  visualized skeletal structures are unremarkable. IMPRESSION: No active cardiopulmonary disease. Electronically Signed   By: Andreas Newport M.D.   On: 04/21/2016 06:23      Domenic Moras, PA-C 04/21/16 [id number]    Isla Pence, MD 04/21/16 [phone]

## 2016-04-21 NOTE — ED Notes (Signed)
Patient aware of need of urine specimen; patient up ambulatory to the bathroom at this time without any distress or difficulty to attempt to provide an urine sample

## 2016-12-23 ENCOUNTER — Ambulatory Visit (HOSPITAL_COMMUNITY)
Admission: EM | Admit: 2016-12-23 | Discharge: 2016-12-23 | Disposition: A | Discharge status: Home / Self Care | Payer: BLUE CROSS/BLUE SHIELD | Attending: Family Medicine | Admitting: Family Medicine

## 2016-12-23 ENCOUNTER — Encounter (HOSPITAL_COMMUNITY): Payer: Self-pay | Admitting: Emergency Medicine

## 2016-12-23 DIAGNOSIS — M545 Low back pain, unspecified: Secondary | ICD-10-CM

## 2016-12-23 DIAGNOSIS — R29898 Other symptoms and signs involving the musculoskeletal system: Secondary | ICD-10-CM

## 2016-12-23 MED ORDER — DICLOFENAC SODIUM 1 % TD GEL: 1.0000 "application " | Freq: Four times a day (QID) | TRANSDERMAL | 0 refills | Status: DC

## 2016-12-23 MED ORDER — TRAMADOL HCL 50 MG PO TABS: 50.0000 mg | ORAL_TABLET | Freq: Four times a day (QID) | ORAL | 0 refills | Status: DC | PRN

## 2016-12-23 MED ORDER — NAPROXEN 375 MG PO TABS: 375.0000 mg | ORAL_TABLET | Freq: Two times a day (BID) | ORAL | 0 refills | Status: DC

## 2016-12-23 NOTE — ED Triage Notes (Signed)
patient reports lower back pain for 1 1/2 months and pain is a daily occurrence  Patient reports pain worsens with movement.  Denies urinary symptoms.  No known injury at onset of this pain

## 2016-12-23 NOTE — Discharge Instructions (Signed)
It is likely you have deconditioning of the muscles of the lower back, her job probably has something to do with this in regards to prolonged sitting and possibly posture. Recommend applying heat as discussed and performing the stretches as demonstrated and using the diclofenac gel. Start the back exercises and 3-4 days. It may take a while for your back to get better. Make sure you have a good chair with lumbar support at work.

## 2016-12-23 NOTE — ED Provider Notes (Signed)
[facility]    CSN: [id number] Arrival date & time: 12/23/16  1238     History   Chief Complaint Chief Complaint  Patient presents with  . Back Pain    HPI Joan Simpson is a 26 y.o. female.   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 She points to the lower paralumbar muscles. Pain is worse with bending, pulling, lifting and twisting. Denies spinal pain, radicular pain, focal paresthesias or weakness. Denies any known trauma, injury or event that may cause the pain.      Past Medical History:  Diagnosis Date  . Allergy   . Anxiety   . Depression   . Hypertension   . No pertinent past medical history     There are no active problems to display for this patient.   Past Surgical History:  Procedure Laterality Date  . NO PAST SURGERIES      OB History    Gravida Para Term Preterm AB Living   1         0   SAB TAB Ectopic Multiple Live Births                   Home Medications    Prior to Admission medications   Medication Sig Start Date End Date Taking? Authorizing Provider  acetaminophen (TYLENOL) 325 MG tablet Take 1,300 mg by mouth every 6 (six) hours as needed for moderate pain.    [provider]  ARIPiprazole (ABILIFY) 5 MG tablet Take 2.5 mg by mouth daily.    [provider]  Armodafinil 250 MG tablet Take 250 mg by mouth daily.    [provider]  butalbital-acetaminophen-caffeine Emelda Brothers, ESGIC) [phone] MG tablet Take 1-2 tablets by mouth every 6 (six) hours as needed for headache. 04/21/16 04/21/17  Domenic Moras, PA-C  diclofenac sodium (VOLTAREN) 1 % GEL Apply 1 application topically 4 (four) times daily. 12/23/16   Janne Napoleon, NP  etonogestrel (IMPLANON) 68 MG IMPL implant 1 each by Subdermal route once.    [provider]  ibuprofen (ADVIL,MOTRIN) 200 MG tablet Take 200-800 mg by mouth every 6 (six) hours  as needed for moderate pain.    [provider]  naproxen (NAPROSYN) 375 MG tablet Take 1 tablet (375 mg total) by mouth 2 (two) times daily. 12/23/16   Janne Napoleon, NP  traMADol (ULTRAM) 50 MG tablet Take 1 tablet (50 mg total) by mouth every 6 (six) hours as needed. 12/23/16   Janne Napoleon, NP    Family History Family History  Problem Relation Age of Onset  . Hypertension Mother     Social History Social History  Substance Use Topics  . Smoking status: Never Smoker  . Smokeless tobacco: Never Used  . Alcohol use No     Comment: occa     Allergies   Erythromycin   Review of Systems Review of Systems  Constitutional: Negative.  Negative for activity change, chills and fever.  HENT: Negative.   Respiratory: Negative.   Cardiovascular: Negative.   Musculoskeletal: Positive for back pain.       As per HPI  Skin: Negative for color change, pallor and rash.  Neurological: Negative.   All other systems reviewed and are negative.    Physical Exam Triage Vital Signs ED Triage Vitals  Enc Vitals Group     BP 12/23/16 1354 Marland Kitchen)  94/59     Pulse Rate 12/23/16 1354 74     Resp 12/23/16 1354 18     Temp 12/23/16 1354 98.5 F (36.9 C)     Temp Source 12/23/16 1354 Oral     SpO2 12/23/16 1354 98 %     Weight --      Height --      Head Circumference --      Peak Flow --      Pain Score 12/23/16 1352 9     Pain Loc --      Pain Edu? --      Excl. in [place]? --    No data found.   Updated Vital Signs BP (!) 94/59 (BP Location: Right Arm)   Pulse 74   Temp 98.5 F (36.9 C) (Oral)   Resp 18   SpO2 98%   Visual Acuity Right Eye Distance:   Left Eye Distance:   Bilateral Distance:    Right Eye Near:   Left Eye Near:    Bilateral Near:     Physical Exam  Constitutional: She is oriented to person, place, and time. She appears well-developed and well-nourished. No distress.  HENT:  Head: Normocephalic and atraumatic.  Eyes: EOM are normal.  Neck: Normal range  of motion. Neck supple.  Cardiovascular: Normal rate.   Pulmonary/Chest: Effort normal.  Musculoskeletal: She exhibits no edema or deformity.  Tenderness across the bilateral para lumbar muscles. Patient is able to lean far beyond 90 or she states she feels pulling in the involved muscles. No direct spinal tenderness, deformity or discoloration. Lower extremity strength is 5 over 5. Distal neurovascular motor sensory is grossly intact.  Neurological: She is alert and oriented to person, place, and time. No cranial nerve deficit.  Skin: Skin is warm and dry.  Psychiatric: She has a normal mood and affect.  Nursing note and vitals reviewed.    UC Treatments / Results  Labs (all labs ordered are listed, but only abnormal results are displayed) Labs Reviewed - No data to display  EKG  EKG Interpretation None       Radiology No results found.  Procedures Procedures (including critical care time)  Medications Ordered in UC Medications - No data to display   Initial Impression / Assessment and Plan / UC Course  I have reviewed the triage vital signs and the nursing notes.  Pertinent labs & imaging results that were available during my care of the patient were reviewed by me and considered in my medical decision making (see chart for details).    It is likely you have deconditioning of the muscles of the lower back, her job probably has something to do with this in regards to prolonged sitting and possibly posture. Recommend applying heat as discussed and performing the stretches as demonstrated and using the diclofenac gel. Start the back exercises and 3-4 days. It may take a while for your back to get better. Make sure you have a good chair with lumbar support at work.    Final Clinical Impressions(s) / UC Diagnoses   Final diagnoses:  Acute bilateral low back pain without sciatica  Muscular deconditioning    New Prescriptions New Prescriptions   DICLOFENAC SODIUM  (VOLTAREN) 1 % GEL    Apply 1 application topically 4 (four) times daily.   NAPROXEN (NAPROSYN) 375 MG TABLET    Take 1 tablet (375 mg total) by mouth 2 (two) times daily.   TRAMADOL (ULTRAM) 50 MG TABLET  Take 1 tablet (50 mg total) by mouth every 6 (six) hours as needed.     Controlled Substance Prescriptions [place] Controlled Substance Registry consulted? Yes, I have consulted the [place] Controlled Substances Registry for this patient, and feel the risk/benefit ratio today is favorable for proceeding with this prescription for a controlled substance.   Janne Napoleon, NP 12/23/16 [phone]

## 2017-01-01 ENCOUNTER — Ambulatory Visit (HOSPITAL_COMMUNITY)
Admission: EM | Admit: 2017-01-01 | Discharge: 2017-01-01 | Disposition: A | Discharge status: Home / Self Care | Payer: BLUE CROSS/BLUE SHIELD | Attending: Emergency Medicine | Admitting: Emergency Medicine

## 2017-01-01 ENCOUNTER — Encounter (HOSPITAL_COMMUNITY): Payer: Self-pay | Admitting: *Deleted

## 2017-01-01 DIAGNOSIS — J209 Acute bronchitis, unspecified: Secondary | ICD-10-CM | POA: Insufficient documentation

## 2017-01-01 DIAGNOSIS — I10 Essential (primary) hypertension: Secondary | ICD-10-CM | POA: Diagnosis not present

## 2017-01-01 DIAGNOSIS — F329 Major depressive disorder, single episode, unspecified: Secondary | ICD-10-CM | POA: Insufficient documentation

## 2017-01-01 DIAGNOSIS — J069 Acute upper respiratory infection, unspecified: Secondary | ICD-10-CM | POA: Insufficient documentation

## 2017-01-01 DIAGNOSIS — R059 Cough, unspecified: Secondary | ICD-10-CM

## 2017-01-01 DIAGNOSIS — R05 Cough: Secondary | ICD-10-CM | POA: Insufficient documentation

## 2017-01-01 DIAGNOSIS — R0981 Nasal congestion: Secondary | ICD-10-CM | POA: Diagnosis not present

## 2017-01-01 DIAGNOSIS — R509 Fever, unspecified: Secondary | ICD-10-CM | POA: Diagnosis not present

## 2017-01-01 DIAGNOSIS — F419 Anxiety disorder, unspecified: Secondary | ICD-10-CM | POA: Insufficient documentation

## 2017-01-01 DIAGNOSIS — R062 Wheezing: Secondary | ICD-10-CM | POA: Insufficient documentation

## 2017-01-01 DIAGNOSIS — R51 Headache: Secondary | ICD-10-CM | POA: Diagnosis present

## 2017-01-01 LAB — POCT RAPID STREP A: Streptococcus, Group A Screen (Direct): NEGATIVE

## 2017-01-01 MED ORDER — ALBUTEROL SULFATE HFA 108 (90 BASE) MCG/ACT IN AERS: 1.0000 | INHALATION_SPRAY | Freq: Four times a day (QID) | RESPIRATORY_TRACT | 0 refills | Status: DC | PRN

## 2017-01-01 MED ORDER — PREDNISONE 10 MG (21) PO TBPK: ORAL_TABLET | Freq: Every day | ORAL | 0 refills | Status: DC

## 2017-01-01 MED ORDER — AMOXICILLIN 500 MG PO CAPS: 500.0000 mg | ORAL_CAPSULE | Freq: Three times a day (TID) | ORAL | 0 refills | Status: DC

## 2017-01-01 MED ORDER — ONDANSETRON HCL 4 MG PO TABS: 4.0000 mg | ORAL_TABLET | Freq: Four times a day (QID) | ORAL | 0 refills | Status: DC

## 2017-01-01 NOTE — ED Triage Notes (Signed)
Patient reports headache, cough, runny nose, fever, vomiting, and general fatigue. Patient has been taking ibuprofen and nyquil for help with symptoms.

## 2017-01-01 NOTE — ED Provider Notes (Signed)
[place]    CSN: [id number] Arrival date & time: 01/01/17  1831     History   Chief Complaint Chief Complaint  Patient presents with  . Headache  . Fever  . Nasal Congestion  . Cough  . Emesis    HPI Joan Simpson is a 26 y.o. female.   Pt states that she has not been feeling well for the past few days, n/v/d/ fever cough, and wheezing. Has never had this before and no one at home is sick. Has taken tylenol and motrin pta with minimal relief.       Past Medical History:  Diagnosis Date  . Allergy   . Anxiety   . Depression   . Hypertension   . No pertinent past medical history     There are no active problems to display for this patient.   Past Surgical History:  Procedure Laterality Date  . NO PAST SURGERIES      OB History    Gravida Para Term Preterm AB Living   1         0   SAB TAB Ectopic Multiple Live Births                   Home Medications    Prior to Admission medications   Medication Sig Start Date End Date Taking? Authorizing Provider  ARIPiprazole (ABILIFY) 5 MG tablet Take 2.5 mg by mouth daily.   Yes [provider]  Armodafinil 250 MG tablet Take 250 mg by mouth daily.   Yes [provider]  etonogestrel (IMPLANON) 68 MG IMPL implant 1 each by Subdermal route once.   Yes [provider]  ibuprofen (ADVIL,MOTRIN) 200 MG tablet Take 200-800 mg by mouth every 6 (six) hours as needed for moderate pain.   Yes [provider]  acetaminophen (TYLENOL) 325 MG tablet Take 1,300 mg by mouth every 6 (six) hours as needed for moderate pain.    [provider]  albuterol (PROVENTIL HFA;VENTOLIN HFA) 108 (90 Base) MCG/ACT inhaler Inhale 1-2 puffs into the lungs every 6 (six) hours as needed for wheezing or shortness of breath. 01/01/17   Marney Setting, NP  amoxicillin (AMOXIL) 500 MG capsule Take 1 capsule (500 mg total) by mouth 3 (three) times daily. 01/01/17   Marney Setting,  NP  butalbital-acetaminophen-caffeine (FIORICET, ESGIC) [phone] MG tablet Take 1-2 tablets by mouth every 6 (six) hours as needed for headache. 04/21/16 04/21/17  Domenic Moras, PA-C  diclofenac sodium (VOLTAREN) 1 % GEL Apply 1 application topically 4 (four) times daily. 12/23/16   Janne Napoleon, NP  naproxen (NAPROSYN) 375 MG tablet Take 1 tablet (375 mg total) by mouth 2 (two) times daily. 12/23/16   Janne Napoleon, NP  ondansetron (ZOFRAN) 4 MG tablet Take 1 tablet (4 mg total) by mouth every 6 (six) hours. 01/01/17   Marney Setting, NP  predniSONE (STERAPRED UNI-PAK 21 TAB) 10 MG (21) TBPK tablet Take by mouth daily. Take 6 tabs by mouth daily  for 2 days, then 5 tabs for 2 days, then 4 tabs for 2 days, then 3 tabs for 2 days, 2 tabs for 2 days, then 1 tab by mouth daily for 2 days 01/01/17   Marney Setting, NP  traMADol (ULTRAM) 50 MG tablet Take 1 tablet (50 mg total) by mouth every 6 (six) hours as needed. 12/23/16   Janne Napoleon, NP    Family History Family History  Problem Relation Age  of Onset  . Hypertension Mother     Social History Social History  Substance Use Topics  . Smoking status: Never Smoker  . Smokeless tobacco: Never Used  . Alcohol use No     Comment: occa     Allergies   Erythromycin   Review of Systems Review of Systems  Constitutional: Positive for appetite change, chills, fatigue and fever.  HENT: Positive for congestion, postnasal drip, rhinorrhea, sinus pain and sinus pressure.   Eyes: Negative.   Respiratory: Positive for cough and shortness of breath.   Cardiovascular: Negative.   Gastrointestinal: Positive for nausea and vomiting.  Genitourinary: Negative.   Skin: Negative.   Neurological: Negative.      Physical Exam Triage Vital Signs ED Triage Vitals  Enc Vitals Group     BP 01/01/17 2002 121/69     Pulse Rate 01/01/17 2002 (!) 110     Resp 01/01/17 2002 18     Temp 01/01/17 2002 99.8 F (37.7 C)     Temp Source 01/01/17 2002 Oral      SpO2 01/01/17 2002 100 %     Weight --      Height --      Head Circumference --      Peak Flow --      Pain Score 01/01/17 2003 5     Pain Loc --      Pain Edu? --      Excl. in [place]? --    No data found.   Updated Vital Signs BP 121/69 (BP Location: Right Arm)   Pulse (!) 110   Temp 99.8 F (37.7 C) (Oral)   Resp 18   SpO2 100%   Visual Acuity     Physical Exam  Constitutional: She appears well-developed.  HENT:  Mouth/Throat: Oropharynx is clear and moist.  Eyes: Pupils are equal, round, and reactive to light.  Neck: Normal range of motion.  Cardiovascular: Normal rate and regular rhythm.   Pulmonary/Chest: She has wheezes. She has rales.  RLL wheezing and rhonchi,  Productive cough with green phlem,   Abdominal: Soft. Bowel sounds are normal.  Skin: Skin is warm.     UC Treatments / Results  Labs (all labs ordered are listed, but only abnormal results are displayed) Labs Reviewed  CULTURE, GROUP A STREP [facility])  POCT RAPID STREP A    EKG  EKG Interpretation None       Radiology No results found.  Procedures Procedures (including critical care time)  Medications Ordered in UC Medications - No data to display   Initial Impression / Assessment and Plan / UC Course  I have reviewed the triage vital signs and the nursing notes.  Pertinent labs & imaging results that were available during my care of the patient were reviewed by me and considered in my medical decision making (see chart for details).    f you become increase sob or increase fever you will need to go to the er  Take meds as prescribed  May use a humidifier to help at night  Take tylenol and motrin as needed for fever  Drink plenty of fluids     Final Clinical Impressions(s) / UC Diagnoses   Final diagnoses:  Upper respiratory tract infection, unspecified type  Cough  Nasal congestion  Wheezing  Acute bronchitis, unspecified organism    New Prescriptions Discharge  Medication List as of 01/01/2017  8:27 PM    START taking these medications   Details  albuterol (PROVENTIL HFA;VENTOLIN HFA) 108 (90 Base) MCG/ACT inhaler Inhale 1-2 puffs into the lungs every 6 (six) hours as needed for wheezing or shortness of breath., Starting Mon 01/01/2017, Normal    amoxicillin (AMOXIL) 500 MG capsule Take 1 capsule (500 mg total) by mouth 3 (three) times daily., Starting Mon 01/01/2017, Normal    ondansetron (ZOFRAN) 4 MG tablet Take 1 tablet (4 mg total) by mouth every 6 (six) hours., Starting Mon 01/01/2017, Normal    predniSONE (STERAPRED UNI-PAK 21 TAB) 10 MG (21) TBPK tablet Take by mouth daily. Take 6 tabs by mouth daily  for 2 days, then 5 tabs for 2 days, then 4 tabs for 2 days, then 3 tabs for 2 days, 2 tabs for 2 days, then 1 tab by mouth daily for 2 days, Starting Mon 01/01/2017, Print         Controlled Substance Prescriptions [place] Controlled Substance Registry consulted? Not Applicable   Marney Setting, NP 01/02/17 [phone]

## 2017-01-01 NOTE — Discharge Instructions (Signed)
If you become increase sob or increase fever you will need to go to the er  Take meds as prescribed  May use a humidifier to help at night  Take tylenol and motrin as needed for fever  Drink plenty of fluids

## 2017-01-04 LAB — CULTURE, GROUP A STREP (THRC)

## 2017-01-16 ENCOUNTER — Ambulatory Visit (INDEPENDENT_AMBULATORY_CARE_PROVIDER_SITE_OTHER): Payer: BLUE CROSS/BLUE SHIELD | Admitting: Physician Assistant

## 2017-01-16 ENCOUNTER — Encounter: Payer: Self-pay | Admitting: Physician Assistant

## 2017-01-16 VITALS — BP 100/66 | HR 89 | Temp 98.7°F | Resp 16 | Ht 61.0 in | Wt 140.8 lb

## 2017-01-16 DIAGNOSIS — J069 Acute upper respiratory infection, unspecified: Secondary | ICD-10-CM

## 2017-01-16 MED ORDER — GUAIFENESIN ER 1200 MG PO TB12: 1.0000 | ORAL_TABLET | Freq: Two times a day (BID) | ORAL | 1 refills | Status: DC | PRN

## 2017-01-16 MED ORDER — FLUTICASONE PROPIONATE 50 MCG/ACT NA SUSP: 2.0000 | Freq: Every day | NASAL | 12 refills | Status: DC

## 2017-01-16 MED ORDER — AMOXICILLIN-POT CLAVULANATE 875-125 MG PO TABS: 1.0000 | ORAL_TABLET | Freq: Two times a day (BID) | ORAL | 0 refills | Status: AC

## 2017-01-16 MED ORDER — BENZONATATE 100 MG PO CAPS: 100.0000 mg | ORAL_CAPSULE | Freq: Three times a day (TID) | ORAL | 0 refills | Status: AC | PRN

## 2017-01-16 NOTE — Patient Instructions (Addendum)
Please hydrate well with 64 oz of water or more Use the flonase and mucinex.  Make sure you are hydrating, because the mucinex can make you feel worse. If you are not having improvement of your throat symptoms, you may fill the antibiotic in 48-72 hours.      IF you received an x-ray today, you will receive an invoice from [facility]. Please contact [facility] at [phone] with questions or concerns regarding your invoice.   IF you received labwork today, you will receive an invoice from [place]. Please contact [facility] at [phone] with questions or concerns regarding your invoice.   Our billing staff will not be able to assist you with questions regarding bills from these companies.  You will be contacted with the lab results as soon as they are available. The fastest way to get your results is to activate your My Chart account. Instructions are located on the last page of this paperwork. If you have not heard from [place] regarding the results in 2 weeks, please contact this office.

## 2017-01-16 NOTE — Progress Notes (Signed)
PRIMARY CARE AT [facility] [address] [place] [address] [phone]  Date:  01/16/2017   Name:  Joan Simpson   DOB:  [birth year]   MRN:  [id number]  PCP:  System, Provider Not In    History of Present Illness:  Joan Simpson is a 26 y.o. female patient who presents to PCP with  Chief Complaint  Patient presents with  . strep    diagnosed last wk, "throat is still sore"     Patient continues to have sore throat after she states that she was diagnosed with strep.  After review of chart, patient was seen at an urgent care 2 weeks ago for sore throat, cough, and n/v/d.  This appeared to be beta hemolytic and she was given amoxicillin.  She continues to have the sore throat.  Cough is non-productive.  She has mild nasal congestion.  No fever.  No sob or dyspnea.    There are no active problems to display for this patient.   Past Medical History:  Diagnosis Date  . Allergy   . Anxiety   . Depression   . Hypertension   . No pertinent past medical history     Past Surgical History:  Procedure Laterality Date  . NO PAST SURGERIES      Social History  Substance Use Topics  . Smoking status: Never Smoker  . Smokeless tobacco: Never Used  . Alcohol use No     Comment: occa    Family History  Problem Relation Age of Onset  . Hypertension Mother     Allergies  Allergen Reactions  . Erythromycin Anaphylaxis    Says she throws up and can't breath    Medication list has been reviewed and updated.  Current Outpatient Prescriptions on File Prior to Visit  Medication Sig Dispense Refill  . acetaminophen (TYLENOL) 325 MG tablet Take 1,300 mg by mouth every 6 (six) hours as needed for moderate pain.    . ARIPiprazole (ABILIFY) 5 MG tablet Take 2.5 mg by mouth daily.    . Armodafinil 250 MG tablet Take 250 mg by mouth daily.    Marland Kitchen etonogestrel (IMPLANON) 68 MG IMPL implant 1 each by Subdermal route once.    Marland Kitchen ibuprofen (ADVIL,MOTRIN) 200 MG tablet Take 200-800 mg by  mouth every 6 (six) hours as needed for moderate pain.    Marland Kitchen albuterol (PROVENTIL HFA;VENTOLIN HFA) 108 (90 Base) MCG/ACT inhaler Inhale 1-2 puffs into the lungs every 6 (six) hours as needed for wheezing or shortness of breath. (Patient not taking: Reported on 01/16/2017) 1 Inhaler 0  . butalbital-acetaminophen-caffeine (FIORICET, ESGIC) 50-325-40 MG tablet Take 1-2 tablets by mouth every 6 (six) hours as needed for headache. (Patient not taking: Reported on 01/16/2017) 20 tablet 0  . diclofenac sodium (VOLTAREN) 1 % GEL Apply 1 application topically 4 (four) times daily. (Patient not taking: Reported on 01/16/2017) 100 g 0  . naproxen (NAPROSYN) 375 MG tablet Take 1 tablet (375 mg total) by mouth 2 (two) times daily. (Patient not taking: Reported on 01/16/2017) 20 tablet 0  . traMADol (ULTRAM) 50 MG tablet Take 1 tablet (50 mg total) by mouth every 6 (six) hours as needed. (Patient not taking: Reported on 01/16/2017) 15 tablet 0   No current facility-administered medications on file prior to visit.     ROS ROS otherwise unremarkable unless listed above.  Physical Examination: BP 100/66   Pulse 89   Temp 98.7 F (37.1 C) (Oral)   Resp 16  Ht 5\' 1"  (1.549 m)   Wt 140 lb 12.8 oz (63.9 kg)   SpO2 96%   BMI 26.60 kg/m  Ideal Body Weight: Weight in (lb) to have BMI = 25: 132  Physical Exam  Constitutional: She is oriented to person, place, and time. She appears well-developed and well-nourished. No distress.  HENT:  Head: Normocephalic and atraumatic.  Right Ear: Tympanic membrane, external ear and ear canal normal.  Left Ear: Tympanic membrane, external ear and ear canal normal.  Nose: Mucosal edema and rhinorrhea present. Right sinus exhibits no maxillary sinus tenderness and no frontal sinus tenderness. Left sinus exhibits no maxillary sinus tenderness and no frontal sinus tenderness.  Mouth/Throat: No uvula swelling. No oropharyngeal exudate, posterior oropharyngeal edema or posterior  oropharyngeal erythema.  Eyes: Pupils are equal, round, and reactive to light. Conjunctivae and EOM are normal.  Cardiovascular: Normal rate and regular rhythm.  Exam reveals no gallop, no distant heart sounds and no friction rub.   No murmur heard. Pulmonary/Chest: Effort normal. No respiratory distress. She has no decreased breath sounds. She has no wheezes. She has no rhonchi.  Lymphadenopathy:       Head (right side): No submandibular, no tonsillar, no preauricular and no posterior auricular adenopathy present.       Head (left side): No submandibular, no tonsillar, no preauricular and no posterior auricular adenopathy present.  Neurological: She is alert and oriented to person, place, and time.  Skin: She is not diaphoretic.  Psychiatric: She has a normal mood and affect. Her behavior is normal.     Assessment and Plan: Joan Simpson is a 26 y.o. female who is here today for  Chief Complaint  Patient presents with  . strep    diagnosed last wk, "throat is still sore"   We will treat with augmentin at this time fill if no improvement within 48-72 hours.  She will try the supportive treatment first.     Acute upper respiratory infection - Plan: amoxicillin-clavulanate (AUGMENTIN) 875-125 MG tablet, fluticasone (FLONASE) 50 MCG/ACT nasal spray, benzonatate (TESSALON) 100 MG capsule  Ivar Drape, PA-C [facility] 10/2/20186:24 PM

## 2017-07-18 ENCOUNTER — Encounter: Payer: Self-pay | Admitting: Physician Assistant

## 2018-02-27 ENCOUNTER — Encounter (HOSPITAL_COMMUNITY): Payer: Self-pay | Admitting: Emergency Medicine

## 2018-02-27 ENCOUNTER — Other Ambulatory Visit: Payer: Self-pay

## 2018-02-27 ENCOUNTER — Emergency Department (HOSPITAL_COMMUNITY): Payer: Managed Care, Other (non HMO)

## 2018-02-27 ENCOUNTER — Emergency Department (HOSPITAL_COMMUNITY)
Admission: EM | Admit: 2018-02-27 | Discharge: 2018-02-28 | Disposition: A | Discharge status: Home / Self Care | Payer: Managed Care, Other (non HMO) | Attending: Emergency Medicine | Admitting: Emergency Medicine

## 2018-02-27 DIAGNOSIS — N83201 Unspecified ovarian cyst, right side: Secondary | ICD-10-CM | POA: Diagnosis not present

## 2018-02-27 DIAGNOSIS — I10 Essential (primary) hypertension: Secondary | ICD-10-CM | POA: Diagnosis not present

## 2018-02-27 DIAGNOSIS — N83209 Unspecified ovarian cyst, unspecified side: Secondary | ICD-10-CM

## 2018-02-27 DIAGNOSIS — R109 Unspecified abdominal pain: Secondary | ICD-10-CM

## 2018-02-27 DIAGNOSIS — R1031 Right lower quadrant pain: Secondary | ICD-10-CM | POA: Diagnosis present

## 2018-02-27 HISTORY — DX: Bipolar disorder, unspecified: F31.9

## 2018-02-27 LAB — CBC
HCT: 35.6 % — ABNORMAL LOW (ref 36.0–46.0)
HEMOGLOBIN: 11.4 g/dL — AB (ref 12.0–15.0)
MCH: 28.4 pg (ref 26.0–34.0)
MCHC: 32 g/dL (ref 30.0–36.0)
MCV: 88.8 fL (ref 80.0–100.0)
Platelets: 382 10*3/uL (ref 150–400)
RBC: 4.01 MIL/uL (ref 3.87–5.11)
RDW: 14.6 % (ref 11.5–15.5)
WBC: 9.7 10*3/uL (ref 4.0–10.5)
nRBC: 0 % (ref 0.0–0.2)

## 2018-02-27 LAB — URINALYSIS, ROUTINE W REFLEX MICROSCOPIC
Bilirubin Urine: NEGATIVE
GLUCOSE, UA: NEGATIVE mg/dL
Hgb urine dipstick: NEGATIVE
Ketones, ur: NEGATIVE mg/dL
NITRITE: NEGATIVE
PH: 7 (ref 5.0–8.0)
Protein, ur: NEGATIVE mg/dL
Specific Gravity, Urine: 1.02 (ref 1.005–1.030)

## 2018-02-27 LAB — I-STAT BETA HCG BLOOD, ED (MC, WL, AP ONLY): I-stat hCG, quantitative: <5 m[IU]/mL (ref <5)

## 2018-02-27 LAB — COMPREHENSIVE METABOLIC PANEL
ALBUMIN: 3.9 g/dL (ref 3.5–5.0)
ALT: 23 U/L (ref 0–44)
AST: 21 U/L (ref 15–41)
Alkaline Phosphatase: 60 U/L (ref 38–126)
Anion gap: 9 (ref 5–15)
BILIRUBIN TOTAL: 0.5 mg/dL (ref 0.3–1.2)
BUN: 12 mg/dL (ref 6–20)
CALCIUM: 9.4 mg/dL (ref 8.9–10.3)
CO2: 24 mmol/L (ref 22–32)
CREATININE: 0.76 mg/dL (ref 0.44–1.00)
Chloride: 107 mmol/L (ref 98–111)
GFR calc Af Amer: >60 mL/min (ref >60)
Glucose, Bld: 100 mg/dL — ABNORMAL HIGH (ref 70–99)
POTASSIUM: 3.5 mmol/L (ref 3.5–5.1)
Sodium: 140 mmol/L (ref 135–145)
TOTAL PROTEIN: 7.7 g/dL (ref 6.5–8.1)

## 2018-02-27 LAB — LIPASE, BLOOD: Lipase: 29 U/L (ref 11–51)

## 2018-02-27 MED ORDER — OXYCODONE-ACETAMINOPHEN 5-325 MG PO TABS
2.0000 | ORAL_TABLET | Freq: Once | ORAL | Status: AC
  Administered 2018-02-27: 2 via ORAL
  Filled 2018-02-27: qty 2

## 2018-02-27 MED ORDER — OXYCODONE HCL 5 MG PO TABS: 5.0000 mg | ORAL_TABLET | ORAL | 0 refills | Status: DC | PRN

## 2018-02-27 MED ORDER — MORPHINE SULFATE (PF) 4 MG/ML IV SOLN
4.0000 mg | Freq: Once | INTRAVENOUS | Status: AC
  Administered 2018-02-27: 4 mg via INTRAVENOUS
  Filled 2018-02-27: qty 1

## 2018-02-27 MED ORDER — SODIUM CHLORIDE 0.9 % IV BOLUS
1000.0000 mL | Freq: Once | INTRAVENOUS | Status: AC
  Administered 2018-02-27: 1000 mL via INTRAVENOUS

## 2018-02-27 MED ORDER — KETOROLAC TROMETHAMINE 15 MG/ML IJ SOLN
30.0000 mg | Freq: Once | INTRAMUSCULAR | Status: AC
  Administered 2018-02-27: 30 mg via INTRAVENOUS
  Filled 2018-02-27: qty 2

## 2018-02-27 MED ORDER — ONDANSETRON HCL 4 MG/2ML IJ SOLN
4.0000 mg | Freq: Once | INTRAMUSCULAR | Status: AC
  Administered 2018-02-27: 4 mg via INTRAVENOUS
  Filled 2018-02-27: qty 2

## 2018-02-27 MED ORDER — IOPAMIDOL (ISOVUE-300) INJECTION 61%
100.0000 mL | Freq: Once | INTRAVENOUS | Status: AC | PRN
  Administered 2018-02-27: 100 mL via INTRAVENOUS

## 2018-02-27 MED ORDER — IOPAMIDOL (ISOVUE-300) INJECTION 61%
INTRAVENOUS | Status: AC
  Filled 2018-02-27: qty 100

## 2018-02-27 MED ORDER — SODIUM CHLORIDE (PF) 0.9 % IJ SOLN
INTRAMUSCULAR | Status: AC
  Filled 2018-02-27: qty 50

## 2018-02-27 NOTE — Discharge Instructions (Signed)
Take Tylenol 1000 mg 4 times a day for 1 week. This is the maximum dose of tylenol (acetaminophen) you can take from all sources. Please check other over-the-counter medications and prescriptions to ensure you are not taking other medications that contain acetaminophen.  You may also take ibuprofen 400 mg 6 times a day alternating with or at the same time as tylenol.  Take oxycodone as needed for breakthrough pain.  This medication can be addicting, sedating and cause constipation.

## 2018-02-27 NOTE — ED Provider Notes (Signed)
[facility] DEPT Provider Note   CSN: [id number] Arrival date & time: 02/27/18  1748     History   Chief Complaint Chief Complaint  Patient presents with  . Abdominal Pain    HPI Joan Simpson is a 27 y.o. female.  HPI  RLQ abdominal pain, stabbing, began around 430PM today Moving breathing deeply becomes a 10/10 from 7/10 Jabbing pain worse with deep breaths or walking, worsened No known fevers at home, but having chills, hog and cold. Sunday felt hot, cold and diarrhea. Monday had normal BM, still having hot and cold. No nausea or vomiting. Low appetite No urinary symptoms Small amount of vaginal discharge, OBGYN said was BV, was a few mos ago in August, not changed.    Past Medical History:  Diagnosis Date  . Allergy   . Anxiety   . Bipolar 1 disorder ([place])   . Depression   . Hypertension   . No pertinent past medical history     There are no active problems to display for this patient.   Past Surgical History:  Procedure Laterality Date  . NO PAST SURGERIES       OB History    Gravida  1   Para      Term      Preterm      AB      Living  0     SAB      TAB      Ectopic      Multiple      Live Births               Home Medications    Prior to Admission medications   Medication Sig Start Date End Date Taking? Authorizing Provider  albuterol (PROVENTIL HFA;VENTOLIN HFA) 108 (90 Base) MCG/ACT inhaler Inhale 1-2 puffs into the lungs every 6 (six) hours as needed for wheezing or shortness of breath. 01/01/17  Yes Marney Setting, NP  clonazePAM (KLONOPIN) 0.5 MG tablet Take 0.25-0.5 mg by mouth every 8 (eight) hours as needed. For restlessness 02/25/18  Yes [provider]  etonogestrel (IMPLANON) 68 MG IMPL implant 1 each by Subdermal route once.   Yes [provider]  ibuprofen (ADVIL,MOTRIN) 200 MG tablet Take 200-800 mg by mouth every 6 (six) hours as needed for moderate  pain.   Yes [provider]  QUEtiapine (SEROQUEL) 400 MG tablet Take 400 mg by mouth at bedtime. 02/25/18  Yes [provider]  traZODone (DESYREL) 100 MG tablet Take 100-200 mg by mouth at bedtime as needed for sleep. 02/25/18  Yes [provider]  diclofenac sodium (VOLTAREN) 1 % GEL Apply 1 application topically 4 (four) times daily. Patient not taking: Reported on 01/16/2017 12/23/16   Janne Napoleon, NP  fluticasone [facility]) 50 MCG/ACT nasal spray Place 2 sprays into both nostrils daily. Patient not taking: Reported on 02/27/2018 01/16/17   Ivar Drape D, PA  Guaifenesin [facility] MAXIMUM STRENGTH) 1200 MG TB12 Take 1 tablet (1,200 mg total) by mouth every 12 (twelve) hours as needed. Patient not taking: Reported on 02/27/2018 01/16/17   Ivar Drape D, PA  naproxen (NAPROSYN) 375 MG tablet Take 1 tablet (375 mg total) by mouth 2 (two) times daily. Patient not taking: Reported on 01/16/2017 12/23/16   Janne Napoleon, NP  oxyCODONE (ROXICODONE) 5 MG immediate release tablet Take 1 tablet (5 mg total) by mouth every 4 (four) hours as needed for severe pain. 02/27/18   Gareth Morgan,  MD  traMADol (ULTRAM) 50 MG tablet Take 1 tablet (50 mg total) by mouth every 6 (six) hours as needed. Patient not taking: Reported on 01/16/2017 12/23/16   Janne Napoleon, NP    Family History Family History  Problem Relation Age of Onset  . Hypertension Mother     Social History Social History   Tobacco Use  . Smoking status: Never Smoker  . Smokeless tobacco: Never Used  Substance Use Topics  . Alcohol use: No    Comment: occa  . Drug use: No     Allergies   Erythromycin   Review of Systems Review of Systems  Constitutional: Negative for fever.  HENT: Negative for sore throat.   Eyes: Negative for visual disturbance.  Respiratory: Negative for cough and shortness of breath.   Cardiovascular: Negative for chest pain.  Gastrointestinal: Positive for abdominal pain  and diarrhea (did have but resolved now). Negative for nausea and vomiting.  Genitourinary: Positive for vaginal discharge. Negative for difficulty urinating and dysuria.  Musculoskeletal: Negative for back pain and neck pain.  Skin: Negative for rash.  Neurological: Negative for syncope and headaches.     Physical Exam Updated Vital Signs BP 109/77   Pulse 97   Temp 97.8 F (36.6 C) (Oral)   Resp 18   Ht 5\' 1"  (1.549 m)   Wt 74.8 kg   SpO2 95%   BMI 31.18 kg/m   Physical Exam  Constitutional: She is oriented to person, place, and time. She appears well-developed and well-nourished. No distress.  HENT:  Head: Normocephalic and atraumatic.  Eyes: Conjunctivae and EOM are normal.  Neck: Normal range of motion.  Cardiovascular: Normal rate, regular rhythm, normal heart sounds and intact distal pulses. Exam reveals no gallop and no friction rub.  No murmur heard. Pulmonary/Chest: Effort normal and breath sounds normal. No respiratory distress. She has no wheezes. She has no rales.  Abdominal: Soft. She exhibits no distension. There is tenderness in the right upper quadrant, right lower quadrant, suprapubic area and left lower quadrant. There is guarding, tenderness at McBurney's point and positive Murphy's sign.  Musculoskeletal: She exhibits no edema or tenderness.  Neurological: She is alert and oriented to person, place, and time.  Skin: Skin is warm and dry. No rash noted. She is not diaphoretic. No erythema.  Nursing note and vitals reviewed.    ED Treatments / Results  Labs (all labs ordered are listed, but only abnormal results are displayed) Labs Reviewed  COMPREHENSIVE METABOLIC PANEL - Abnormal; Notable for the following components:      Result Value   Glucose, Bld 100 (*)    All other components within normal limits  CBC - Abnormal; Notable for the following components:   Hemoglobin 11.4 (*)    HCT 35.6 (*)    All other components within normal limits    URINALYSIS, ROUTINE W REFLEX MICROSCOPIC - Abnormal; Notable for the following components:   APPearance HAZY (*)    Leukocytes, UA TRACE (*)    Bacteria, UA RARE (*)    All other components within normal limits  LIPASE, BLOOD  I-STAT BETA HCG BLOOD, ED ([facility], [facility], [facility] ONLY)    EKG None  Radiology Ct Abdomen Pelvis W Contrast  Result Date: 02/27/2018 CLINICAL DATA:  Acute onset sharp right lower quadrant pain today. Suspected appendicitis. EXAM: CT ABDOMEN AND PELVIS WITH CONTRAST TECHNIQUE: Multidetector CT imaging of the abdomen and pelvis was performed using the standard protocol following bolus administration of intravenous  contrast. CONTRAST:  165mL ISOVUE-300 IOPAMIDOL (ISOVUE-300) INJECTION 61% COMPARISON:  07/22/2013 FINDINGS: Lower Chest: No acute findings. Hepatobiliary: No hepatic masses identified. Gallbladder is unremarkable. Pancreas:  No mass or inflammatory changes. Spleen: Within normal limits in size and appearance. Adrenals/Urinary Tract: Horseshoe kidney again demonstrated. No evidence of mass or hydronephrosis. Stomach/Bowel: Small hiatal hernia. No evidence of obstruction, inflammatory process or abnormal fluid collections. Normal appendix visualized. Vascular/Lymphatic: No pathologically enlarged lymph nodes. No abdominal aortic aneurysm. Reproductive: Normal appearance of uterus. A simple benign-appearing left ovarian cyst is seen measuring 2.7 x 4.5 cm, most likely physiologic in a reproductive age female. No evidence of adnexal mass, inflammatory process, or free fluid. Other:  None. Musculoskeletal:  No suspicious bone lesions identified. IMPRESSION: No evidence of appendicitis. 4.5 cm benign-appearing left ovarian cyst, most likely physiologic in a reproductive age female. No imaging follow-up required. This recommendation follows ACR consensus guidelines: White Paper of the ACR Incidental Findings Committee II on Adnexal Findings. J Am Coll Radiol [phone]. Small  hiatal hernia. Horseshoe kidney. Electronically Signed   By: Earle Gell M.D.   On: 02/27/2018 20:36   US Pelvic Complete W Transvaginal And Torsion R/o  Result Date: 02/27/2018 CLINICAL DATA:  Initial evaluation for acute lower abdominal pain. EXAM: TRANSABDOMINAL AND TRANSVAGINAL ULTRASOUND OF PELVIS DOPPLER ULTRASOUND OF OVARIES TECHNIQUE: Both transabdominal and transvaginal ultrasound examinations of the pelvis were performed. Transabdominal technique was performed for global imaging of the pelvis including uterus, ovaries, adnexal regions, and pelvic cul-de-sac. It was necessary to proceed with endovaginal exam following the transabdominal exam to visualize the uterus, endometrium, and ovaries. Color and duplex Doppler ultrasound was utilized to evaluate blood flow to the ovaries. COMPARISON:  Prior CT from earlier the same day. FINDINGS: Uterus Measurements: 6.5 x 2.4 x 3.6 cm = volume: 29.8 mL. No fibroids or other mass visualized. Endometrium Thickness: 5.3.  No focal abnormality visualized. Right ovary Measurements: 2.8 x 1.9 x 1.6 cm = volume: 4.3 mL. Normal appearance/no adnexal mass. Left ovary Measurements: 4.6 x 2.9 x 3.7 cm = volume: 25.3 mL. 3.6 x 2.0 x 3.0 cm complex left ovarian cyst with internal lace-like architecture, most consistent with a benign hemorrhagic cyst. Pulsed Doppler evaluation of both ovaries demonstrates normal low-resistance arterial and venous waveforms. Other findings Associated small volume free physiologic fluid within the left adnexa. IMPRESSION: 1. 3.6 cm complex left ovarian cyst, most consistent with a benign hemorrhagic cyst. Associated small volume free fluid within the adjacent left adnexa suggests possible recent cyst rupture. No follow-up imaging recommended regarding this lesion. 2. No other acute abnormality within the pelvis. No evidence for torsion. Findings were discussed by telephone at the time of interpretation on 02/27/2018 at 10:07 pm with Dr. Gareth Morgan. Electronically Signed   By: Jeannine Boga M.D.   On: 02/27/2018 22:10    Procedures Procedures (including critical care time)  Medications Ordered in ED Medications  iopamidol (ISOVUE-300) 61 % injection (has no administration in time range)  sodium chloride (PF) 0.9 % injection (has no administration in time range)  sodium chloride 0.9 % bolus 1,000 mL (0 mLs Intravenous Stopped 02/27/18 2307)  morphine 4 MG/ML injection 4 mg (4 mg Intravenous Given 02/27/18 2055)  ondansetron (ZOFRAN) injection 4 mg (4 mg Intravenous Given 02/27/18 2055)  sodium chloride 0.9 % bolus 1,000 mL (0 mLs Intravenous Stopped 02/27/18 2157)  iopamidol (ISOVUE-300) 61 % injection 100 mL (100 mLs Intravenous Contrast Given 02/27/18 2006)  morphine 4 MG/ML injection 4 mg (  4 mg Intravenous Given 02/27/18 2204)  ketorolac (TORADOL) 15 MG/ML injection 30 mg (30 mg Intravenous Given 02/27/18 2212)  oxyCODONE-acetaminophen (PERCOCET/ROXICET) 5-325 MG per tablet 2 tablet (2 tablets Oral Given 02/27/18 2308)     Initial Impression / Assessment and Plan / ED Course  I have reviewed the triage vital signs and the nursing notes.  Pertinent labs & imaging results that were available during my care of the patient were reviewed by me and considered in my medical decision making (see chart for details).     27yo female presents with concern for abdominal pain.  Differential diagnosis includes appendicitis, cholecystitis, perforation, ovarian torsion or PID.  Pregnancy test is negative. Reports scant discharge unchanged overtime and previously diagnosed with BV and doubt PID.   Patient given IV fluids, morphine, and Zofran and CT abdomen pelvis was performed which showed benign-appearing left ovarian cyst without signs of appendicitis or other acute abnormalities.   Given severity of pain, ordered emergent torsion ultrasound.  Ultrasound returned showing left hemorrhagic cyst with free fluid, no evidence  of torsion.  Suspect more diffuse pain, pain with movement is due to ruptured hemorrhagic cyst, however given severity of pain discussed with Dr. Levell July regarding possible transfer for continued evaluation of torsion and pain control, and he recommends po pain medications in the ED, and does not suspect torsion given imaging findings.    Discussed thought process in detail with patient and mom.  Feel torsion missed by [place] less likely in that her pain is likely caused by hemorrhagic cyst with rupture.  Discussed possibility of giving oral medications and reevaluating to see if she may need pain control admission, however pt prefers to go home. Given oxycodone rx, recommend tylenol and ibuprofen. Patient discharged in stable condition with understanding of reasons to return.   Final Clinical Impressions(s) / ED Diagnoses   Final diagnoses:  Abdominal pain, unspecified abdominal location  Abdominal pain  Hemorrhagic ovarian cyst    ED Discharge Orders         Ordered    oxyCODONE (ROXICODONE) 5 MG immediate release tablet  Every 4 hours PRN     02/27/18 2349           Gareth Morgan, MD 02/28/18 0041

## 2018-02-27 NOTE — ED Notes (Signed)
Bed: RD40 Expected date:  Expected time:  Means of arrival:  Comments: Ems abdominal pain

## 2018-02-27 NOTE — ED Triage Notes (Signed)
Patient came in from home with lower bilateral abdominal pain she describes as stabbing. This started at 1600 today. 213mcg of fentanyl was given by EMS.  Reported vitals: 109/72 BP 104 HR 20 Resp rate 02 sats- 99% on 2L nasal canula.

## 2018-02-27 NOTE — ED Notes (Signed)
Ultrasound at bedside

## 2018-02-28 ENCOUNTER — Encounter: Payer: Self-pay | Admitting: Obstetrics & Gynecology

## 2018-07-10 ENCOUNTER — Encounter (HOSPITAL_COMMUNITY): Payer: Self-pay | Admitting: Obstetrics and Gynecology

## 2018-07-10 ENCOUNTER — Emergency Department (HOSPITAL_COMMUNITY): Payer: Self-pay

## 2018-07-10 ENCOUNTER — Other Ambulatory Visit: Payer: Self-pay

## 2018-07-10 ENCOUNTER — Emergency Department (HOSPITAL_COMMUNITY)
Admission: EM | Admit: 2018-07-10 | Discharge: 2018-07-10 | Disposition: A | Discharge status: Home / Self Care | Payer: Self-pay | Attending: Emergency Medicine | Admitting: Emergency Medicine

## 2018-07-10 DIAGNOSIS — Z79899 Other long term (current) drug therapy: Secondary | ICD-10-CM | POA: Insufficient documentation

## 2018-07-10 DIAGNOSIS — F319 Bipolar disorder, unspecified: Secondary | ICD-10-CM | POA: Insufficient documentation

## 2018-07-10 DIAGNOSIS — F419 Anxiety disorder, unspecified: Secondary | ICD-10-CM | POA: Insufficient documentation

## 2018-07-10 DIAGNOSIS — J069 Acute upper respiratory infection, unspecified: Secondary | ICD-10-CM | POA: Insufficient documentation

## 2018-07-10 DIAGNOSIS — B9789 Other viral agents as the cause of diseases classified elsewhere: Secondary | ICD-10-CM

## 2018-07-10 DIAGNOSIS — I10 Essential (primary) hypertension: Secondary | ICD-10-CM | POA: Insufficient documentation

## 2018-07-10 LAB — POC URINE PREG, ED: PREG TEST UR: NEGATIVE

## 2018-07-10 MED ORDER — PROMETHAZINE-DM 6.25-15 MG/5ML PO SYRP: 5.0000 mL | ORAL_SOLUTION | Freq: Four times a day (QID) | ORAL | 0 refills | Status: DC | PRN

## 2018-07-10 MED ORDER — ONDANSETRON 8 MG PO TBDP
8.0000 mg | ORAL_TABLET | Freq: Once | ORAL | Status: AC
  Administered 2018-07-10: 8 mg via ORAL
  Filled 2018-07-10: qty 1

## 2018-07-10 MED ORDER — NAPROXEN SODIUM 220 MG PO TABS: 220.0000 mg | ORAL_TABLET | Freq: Two times a day (BID) | ORAL | 0 refills | Status: DC | PRN

## 2018-07-10 NOTE — ED Provider Notes (Signed)
[place] DEPT Provider Note   CSN: [id number] Arrival date & time: 07/10/18  1123    History   Chief Complaint Chief Complaint  Patient presents with  . Cough  . Sore Throat  . Chills    HPI Joan Simpson is a 28 y.o. female.     28 year old female with past medical history including bipolar disorder, hypertension who presents with multiple complaints including cough.  Patient states that 2 weeks ago she began having a cough that has been persistent.  For the past week, she has had generalized body aches, chills, subjective fevers, generalized abdominal cramping, nausea, vomiting, sore throat, and nasal congestion.  No diarrhea.  No urinary symptoms.  No sick contacts or recent travel.  She has taken over-the-counter medications for her symptoms, last dose was last night.  The history is provided by the patient.  Cough  Sore Throat     Past Medical History:  Diagnosis Date  . Allergy   . Anxiety   . Bipolar 1 disorder ([place])   . Depression   . Hypertension   . No pertinent past medical history     There are no active problems to display for this patient.   Past Surgical History:  Procedure Laterality Date  . NO PAST SURGERIES       OB History    Gravida  1   Para      Term      Preterm      AB      Living  0     SAB      TAB      Ectopic      Multiple      Live Births               Home Medications    Prior to Admission medications   Medication Sig Start Date End Date Taking? Authorizing Provider  clonazePAM (KLONOPIN) 0.5 MG tablet Take 0.5 mg by mouth every 8 (eight) hours as needed for anxiety. For restlessness 02/25/18  Yes [provider]  etonogestrel (IMPLANON) 68 MG IMPL implant 1 each by Subdermal route once.   Yes [provider]  lamoTRIgine (LAMICTAL) 25 MG tablet Take 25 mg by mouth daily. 05/21/18  Yes [provider]  modafinil (PROVIGIL) 200 MG tablet Take  200 mg by mouth daily.   Yes [provider]  Pseudoeph-Doxylamine-DM-APAP (NYQUIL PO) Take 30 mLs by mouth daily as needed (cough/flu).   Yes [provider]  QUEtiapine (SEROQUEL) 400 MG tablet Take 400 mg by mouth at bedtime. 02/25/18  Yes [provider]  traZODone (DESYREL) 100 MG tablet Take 100-200 mg by mouth at bedtime as needed for sleep. 02/25/18  Yes [provider]  albuterol (PROVENTIL HFA;VENTOLIN HFA) 108 (90 Base) MCG/ACT inhaler Inhale 1-2 puffs into the lungs every 6 (six) hours as needed for wheezing or shortness of breath. Patient not taking: Reported on 07/10/2018 01/01/17   Marney Setting, NP  diclofenac sodium (VOLTAREN) 1 % GEL Apply 1 application topically 4 (four) times daily. Patient not taking: Reported on 07/10/2018 12/23/16   Janne Napoleon, NP  fluticasone [facility]) 50 MCG/ACT nasal spray Place 2 sprays into both nostrils daily. Patient not taking: Reported on 07/10/2018 01/16/17   Ivar Drape D, PA  naproxen sodium (ALEVE) 220 MG tablet Take 1 tablet (220 mg total) by mouth 2 (two) times daily as needed (headache, pain). 07/10/18   Jaqwon Manfred, Wenda Overland, MD  promethazine-dextromethorphan (PROMETHAZINE-DM) 6.25-15 MG/5ML syrup Take 5 mLs by mouth 4 (four) times daily as needed for cough. 07/10/18   Daveigh Batty, Wenda Overland, MD    Family History Family History  Problem Relation Age of Onset  . Hypertension Mother     Social History Social History   Tobacco Use  . Smoking status: Never Smoker  . Smokeless tobacco: Never Used  Substance Use Topics  . Alcohol use: No    Comment: occa  . Drug use: No     Allergies   Erythromycin   Review of Systems Review of Systems  Respiratory: Positive for cough.    All other systems reviewed and are negative except that which was mentioned in HPI   Physical Exam Updated Vital Signs BP 102/66 (BP Location: Right Arm)   Pulse 78   Temp 99 F (37.2 C) (Oral)   Resp 12    SpO2 100%   Physical Exam Vitals signs and nursing note reviewed.  Constitutional:      General: She is not in acute distress.    Appearance: She is well-developed.  HENT:     Head: Normocephalic and atraumatic.     Mouth/Throat:     Mouth: Mucous membranes are moist.     Comments: Mild erythema posterior oropharynx with mild symmetric enlargement of tonsils, uvula midline, no tonsillar asymmetry, no exudates Eyes:     Conjunctiva/sclera: Conjunctivae normal.  Neck:     Musculoskeletal: Neck supple.  Cardiovascular:     Rate and Rhythm: Regular rhythm. Tachycardia present.     Heart sounds: Normal heart sounds. No murmur.  Pulmonary:     Effort: Pulmonary effort is normal.     Breath sounds: Normal breath sounds. No wheezing or rales.  Abdominal:     General: Bowel sounds are normal. There is no distension.     Palpations: Abdomen is soft.     Tenderness: There is no abdominal tenderness.  Skin:    General: Skin is warm and dry.  Neurological:     Mental Status: She is alert and oriented to person, place, and time.     Comments: Fluent speech  Psychiatric:        Judgment: Judgment normal.      ED Treatments / Results  Labs (all labs ordered are listed, but only abnormal results are displayed) Labs Reviewed  POC URINE PREG, ED    EKG EKG Interpretation  Date/Time:  Wednesday July 10 2018 11:35:33 EDT Ventricular Rate:  128 PR Interval:    QRS Duration: 83 QT Interval:  319 QTC Calculation: 466 R Axis:   38 Text Interpretation:  Sinus tachycardia Probable left atrial enlargement RSR' in V1 or V2, right VCD or RVH Baseline wander in lead(s) V2 V4 V5 rate faster but otherwise similar to previous Confirmed by Theotis Burrow [id number]) on 07/10/2018 12:32:48 PM   Radiology Dg Chest Port 1 View  Result Date: 07/10/2018 CLINICAL DATA:  Cough for 2 weeks, body aches, malaise, no travel history EXAM: PORTABLE CHEST 1 VIEW COMPARISON:  Portable exam 1219 hours compared  to 04/21/2016 FINDINGS: Normal heart size, mediastinal contours, and pulmonary vascularity. Lungs clear. No infiltrate, pleural effusion or pneumothorax. Bones unremarkable. IMPRESSION: No acute abnormalities. Electronically Signed   By: Lavonia Dana M.D.   On: 07/10/2018 12:30    Procedures Procedures (including critical care time)  Medications Ordered in ED Medications  ondansetron (ZOFRAN-ODT) disintegrating tablet 8 mg (8 mg Oral Given 07/10/18 1216)     Initial Impression /  Assessment and Plan / ED Course  I have reviewed the triage vital signs and the nursing notes.  Pertinent labs & imaging results that were available during my care of the patient were reviewed by me and considered in my medical decision making (see chart for details).        PT non-toxic on exam, Afebrile, HR 109, O2 100% on RA, normal WOB. She has no high risk exposures or recent travel suggestive of COVID19, therefore per hospital protocol, will not test at this time.   Pregnancy test negative.  Chest x-ray is unremarkable.  Her vital signs remain reassuring.  She has been able to drink here with no vomiting in the ED.  Symptoms are consistent with a viral URI.  I have discussed supportive measures for her symptoms and provided with cough medication.  No indication for antibiotics currently.  I explained that although she is low risk for COVID, I am recommending self-quarantine at home until symptoms resolve.  I have extensively reviewed return precautions including breathing difficulty or concerns for severe dehydration.  She voiced understanding.  Final Clinical Impressions(s) / ED Diagnoses   Final diagnoses:  Viral URI with cough    ED Discharge Orders         Ordered    promethazine-dextromethorphan (PROMETHAZINE-DM) 6.25-15 MG/5ML syrup  4 times daily PRN     07/10/18 1335    naproxen sodium (ALEVE) 220 MG tablet  2 times daily PRN     07/10/18 1335           Kloe Oates, Wenda Overland, MD 07/10/18  1401

## 2018-07-10 NOTE — ED Notes (Signed)
Patient given discharge teaching and verbalized understanding. Patient ambulated out of ED with a steady gait. 

## 2018-07-10 NOTE — ED Notes (Signed)
Patient was able to take sips of ice-water.

## 2018-07-10 NOTE — ED Triage Notes (Signed)
Pt reports cough, generalized body aches and chills. Pt also reports abdominal pain. Pt reports she has had a dry cough and has not been coughing anything up.  Pt reports nausea and emesis.

## 2018-10-31 ENCOUNTER — Other Ambulatory Visit: Payer: Self-pay

## 2018-10-31 ENCOUNTER — Emergency Department (HOSPITAL_COMMUNITY)
Admission: EM | Admit: 2018-10-31 | Discharge: 2018-11-01 | Disposition: A | Discharge status: Home / Self Care | Payer: 59 | Attending: Emergency Medicine | Admitting: Emergency Medicine

## 2018-10-31 ENCOUNTER — Emergency Department (HOSPITAL_COMMUNITY): Payer: 59

## 2018-10-31 ENCOUNTER — Encounter (HOSPITAL_COMMUNITY): Payer: Self-pay

## 2018-10-31 DIAGNOSIS — J029 Acute pharyngitis, unspecified: Secondary | ICD-10-CM | POA: Insufficient documentation

## 2018-10-31 DIAGNOSIS — R059 Cough, unspecified: Secondary | ICD-10-CM

## 2018-10-31 DIAGNOSIS — R05 Cough: Secondary | ICD-10-CM | POA: Insufficient documentation

## 2018-10-31 DIAGNOSIS — I10 Essential (primary) hypertension: Secondary | ICD-10-CM | POA: Insufficient documentation

## 2018-10-31 DIAGNOSIS — Z79899 Other long term (current) drug therapy: Secondary | ICD-10-CM | POA: Diagnosis not present

## 2018-10-31 DIAGNOSIS — R509 Fever, unspecified: Secondary | ICD-10-CM

## 2018-10-31 DIAGNOSIS — Z20828 Contact with and (suspected) exposure to other viral communicable diseases: Secondary | ICD-10-CM | POA: Insufficient documentation

## 2018-10-31 LAB — GROUP A STREP BY PCR: Group A Strep by PCR: NOT DETECTED

## 2018-10-31 NOTE — ED Triage Notes (Signed)
Pt arrived with complaints of a non-productive cough, sore throat, and chills for two days. Pt denies being around anyone else sick.

## 2018-11-01 ENCOUNTER — Encounter (HOSPITAL_COMMUNITY): Payer: Self-pay | Admitting: Emergency Medicine

## 2018-11-01 LAB — SARS CORONAVIRUS 2 BY RT PCR (HOSPITAL ORDER, PERFORMED IN ~~LOC~~ HOSPITAL LAB): SARS Coronavirus 2: NEGATIVE

## 2018-11-01 MED ORDER — DEXAMETHASONE SODIUM PHOSPHATE 10 MG/ML IJ SOLN
10.0000 mg | Freq: Once | INTRAMUSCULAR | Status: AC
  Administered 2018-11-01: 10 mg via INTRAMUSCULAR
  Filled 2018-11-01: qty 1

## 2018-11-01 NOTE — ED Provider Notes (Addendum)
[facility] DEPT Provider Note: Georgena Spurling, MD, FACEP  CSN: [id number] MRN: [id number] ARRIVAL: 10/31/18 at 2238 ROOM: [place]  Sore Throat and Cough   HISTORY OF PRESENT ILLNESS  11/01/18 2:06 AM Joan Simpson is a 28 y.o. female who is currently being treated for urinary tract infection with Bactrim.  She is here with a 2-day history of sore throat and cough.  She describes the pain in her throat as severe, aching and feeling like she is going to choke.  It is worse with swallowing.  She denies shortness of breath.  She does not know if she has had a fever but has felt alternately hot and cold.  She denies chest pain, abdominal pain, nausea, vomiting and diarrhea.  She has had urinary frequency.   Past Medical History:  Diagnosis Date  . Allergy   . Anxiety   . Bipolar 1 disorder ([place])   . Depression   . Hypertension     Past Surgical History:  Procedure Laterality Date  . NO PAST SURGERIES      Family History  Problem Relation Age of Onset  . Hypertension Mother     Social History   Tobacco Use  . Smoking status: Never Smoker  . Smokeless tobacco: Never Used  Substance Use Topics  . Alcohol use: No    Comment: occa  . Drug use: No    Prior to Admission medications   Medication Sig Start Date End Date Taking? Authorizing Provider  clonazePAM (KLONOPIN) 0.5 MG tablet Take 0.5 mg by mouth every 8 (eight) hours as needed for anxiety. For restlessness 02/25/18   [provider]  etonogestrel (IMPLANON) 68 MG IMPL implant 1 each by Subdermal route once.    [provider]  lamoTRIgine (LAMICTAL) 25 MG tablet Take 25 mg by mouth daily. 05/21/18   [provider]  modafinil (PROVIGIL) 200 MG tablet Take 200 mg by mouth daily.    [provider]  naproxen sodium (ALEVE) 220 MG tablet Take 1 tablet (220 mg total) by mouth 2 (two) times daily as needed (headache, pain). 07/10/18   Little, Wenda Overland, MD   promethazine-dextromethorphan (PROMETHAZINE-DM) 6.25-15 MG/5ML syrup Take 5 mLs by mouth 4 (four) times daily as needed for cough. 07/10/18   Little, Wenda Overland, MD  Pseudoeph-Doxylamine-DM-APAP (NYQUIL PO) Take 30 mLs by mouth daily as needed (cough/flu).    [provider]  QUEtiapine (SEROQUEL) 400 MG tablet Take 400 mg by mouth at bedtime. 02/25/18   [provider]  traZODone (DESYREL) 100 MG tablet Take 100-200 mg by mouth at bedtime as needed for sleep. 02/25/18   [provider]  albuterol (PROVENTIL HFA;VENTOLIN HFA) 108 (90 Base) MCG/ACT inhaler Inhale 1-2 puffs into the lungs every 6 (six) hours as needed for wheezing or shortness of breath. Patient not taking: Reported on 07/10/2018 01/01/17 11/01/18  Marney Setting, NP  fluticasone [facility]) 50 MCG/ACT nasal spray Place 2 sprays into both nostrils daily. Patient not taking: Reported on 07/10/2018 01/16/17 11/01/18  Ivar Drape D, PA    Allergies Erythromycin   REVIEW OF SYSTEMS  Negative except as noted here or in the History of Present Illness.   PHYSICAL EXAMINATION  Initial Vital Signs Blood pressure (!) 94/53, pulse (!) 118, temperature 98.3 F (36.8 C), temperature source Oral, resp. rate 20, SpO2 100 %.  Examination General: Well-developed, well-nourished female in no acute distress; appearance consistent with age of record HENT: normocephalic; atraumatic; mild tonsillar  erythema and enlargement without exudate; no trismus; uvula midline Eyes: pupils equal, round and reactive to light; extraocular muscles intact Neck: supple; anterior cervical lymphadenopathy Heart: regular rate and rhythm Lungs: clear to auscultation bilaterally Abdomen: soft; nondistended; nontender; no masses or hepatosplenomegaly; bowel sounds present Extremities: No deformity; full range of motion; pulses normal Neurologic: Awake, alert and oriented; motor function intact in all extremities and symmetric; no  facial droop Skin: Warm and dry Psychiatric: Normal mood and affect   RESULTS  Summary of this visit's results, reviewed by myself:   EKG Interpretation  Date/Time:    Ventricular Rate:    PR Interval:    QRS Duration:   QT Interval:    QTC Calculation:   R Axis:     Text Interpretation:        Laboratory Studies: Results for orders placed or performed during the hospital encounter of 10/31/18 (from the past 24 hour(s))  Group A Strep by PCR     Status: None   Collection Time: 10/31/18 10:53 PM   Specimen: Throat; Sterile Swab  Result Value Ref Range   Group A Strep by PCR NOT DETECTED NOT DETECTED  SARS Coronavirus 2 (CEPHEID- Performed in [place] hospital lab), Hosp Order     Status: None   Collection Time: 11/01/18  1:50 AM   Specimen: Nasopharyngeal Swab  Result Value Ref Range   SARS Coronavirus 2 NEGATIVE NEGATIVE   Imaging Studies: Dg Chest Port 1 View  Result Date: 10/31/2018 CLINICAL DATA:  Cough EXAM: PORTABLE CHEST 1 VIEW COMPARISON:  07/10/2018 FINDINGS: Heart and mediastinal contours are within normal limits. No focal opacities or effusions. No acute bony abnormality. IMPRESSION: No active disease. Electronically Signed   By: Rolm Baptise M.D.   On: 10/31/2018 23:13    ED COURSE and MDM  Nursing notes and initial vitals signs, including pulse oximetry, reviewed.  Vitals:   10/31/18 2249 11/01/18 0015 11/01/18 0252 11/01/18 0254  BP: 137/74 (!) 94/53  (!) 89/59  Pulse: (!) 142 (!) 118  98  Resp: 20 20 16 16   Temp: 99.7 F (37.6 C) 98.3 F (36.8 C)  98.1 F (36.7 C)  TempSrc: Oral Oral  Oral  SpO2: 99% 100%  100%   4:40 AM History, examination and diagnostic studies consistent with a viral illness.  Blood pressure is currently 100/60.  She states her blood pressure tends to run low chronically.  PROCEDURES    ED DIAGNOSES     ICD-10-CM   1. Viral pharyngitis  J02.9   2. Fever  R50.9 DG Chest [facility] 1 View    DG Chest Port 1 View  3.  Cough  R05        Sally-Anne Wamble, Jenny Reichmann, MD 11/01/18 0443    Shanon Rosser, MD 11/01/18 [phone]

## 2019-05-20 ENCOUNTER — Emergency Department (HOSPITAL_COMMUNITY)
Admission: EM | Admit: 2019-05-20 | Discharge: 2019-05-20 | Disposition: A | Discharge status: Home / Self Care | Payer: 59 | Attending: Emergency Medicine | Admitting: Emergency Medicine

## 2019-05-20 ENCOUNTER — Encounter (HOSPITAL_COMMUNITY): Payer: Self-pay | Admitting: *Deleted

## 2019-05-20 ENCOUNTER — Other Ambulatory Visit: Payer: Self-pay

## 2019-05-20 DIAGNOSIS — Z20822 Contact with and (suspected) exposure to covid-19: Secondary | ICD-10-CM | POA: Insufficient documentation

## 2019-05-20 DIAGNOSIS — R509 Fever, unspecified: Secondary | ICD-10-CM | POA: Insufficient documentation

## 2019-05-20 DIAGNOSIS — I10 Essential (primary) hypertension: Secondary | ICD-10-CM | POA: Insufficient documentation

## 2019-05-20 DIAGNOSIS — Z79899 Other long term (current) drug therapy: Secondary | ICD-10-CM | POA: Insufficient documentation

## 2019-05-20 LAB — SARS CORONAVIRUS 2 (TAT 6-24 HRS): SARS Coronavirus 2: NEGATIVE

## 2019-05-20 LAB — POC SARS CORONAVIRUS 2 AG -  ED: SARS Coronavirus 2 Ag: NEGATIVE

## 2019-05-20 MED ORDER — LACTATED RINGERS IV BOLUS
1000.0000 mL | Freq: Once | INTRAVENOUS | Status: AC
  Administered 2019-05-20: 1000 mL via INTRAVENOUS

## 2019-05-20 MED ORDER — ONDANSETRON HCL 4 MG PO TABS: 4.0000 mg | ORAL_TABLET | Freq: Four times a day (QID) | ORAL | 0 refills | Status: DC

## 2019-05-20 MED ORDER — ALUM & MAG HYDROXIDE-SIMETH 200-200-20 MG/5ML PO SUSP
30.0000 mL | Freq: Once | ORAL | Status: AC
  Administered 2019-05-20: 30 mL via ORAL
  Filled 2019-05-20: qty 30

## 2019-05-20 MED ORDER — LIDOCAINE VISCOUS HCL 2 % MT SOLN
15.0000 mL | Freq: Once | OROMUCOSAL | Status: AC
  Administered 2019-05-20: 15 mL via ORAL
  Filled 2019-05-20: qty 15

## 2019-05-20 MED ORDER — ACETAMINOPHEN 500 MG PO TABS
1000.0000 mg | ORAL_TABLET | Freq: Once | ORAL | Status: AC
  Administered 2019-05-20: 11:00:00 1000 mg via ORAL
  Filled 2019-05-20: qty 2

## 2019-05-20 MED ORDER — ONDANSETRON HCL 4 MG/2ML IJ SOLN
4.0000 mg | Freq: Once | INTRAMUSCULAR | Status: AC
  Administered 2019-05-20: 4 mg via INTRAVENOUS
  Filled 2019-05-20: qty 2

## 2019-05-20 NOTE — ED Notes (Signed)
Discharge instructions reviewed with patient. Patient verbalized understanding. Patient getting dressed.

## 2019-05-20 NOTE — ED Provider Notes (Signed)
[facility] DEPT Provider Note   CSN: [id number] Arrival date & time: 05/20/19  [id number]     History Chief Complaint  Patient presents with  . Fever    Joan Simpson is a 29 y.o. female.  The history is provided by the patient.  Fever Max temp prior to arrival:  100.4 Temp source:  Oral Severity:  Moderate Onset quality:  Gradual Duration:  2 days Timing:  Constant Progression:  Worsening Chronicity:  New Relieved by:  Acetaminophen Worsened by:  Nothing Ineffective treatments:  None tried Associated symptoms: chills, congestion, cough, diarrhea, headaches, myalgias, nausea and vomiting   Associated symptoms: no dysuria   Associated symptoms comment:  Loss of taste and smell Risk factors: no recent sickness and no sick contacts        Past Medical History:  Diagnosis Date  . Allergy   . Anxiety   . Bipolar 1 disorder ([place])   . Depression   . Hypertension     There are no problems to display for this patient.   Past Surgical History:  Procedure Laterality Date  . NO PAST SURGERIES       OB History    Gravida  1   Para      Term      Preterm      AB      Living  0     SAB      TAB      Ectopic      Multiple      Live Births              Family History  Problem Relation Age of Onset  . Hypertension Mother     Social History   Tobacco Use  . Smoking status: Never Smoker  . Smokeless tobacco: Never Used  Substance Use Topics  . Alcohol use: No    Comment: occa  . Drug use: No    Home Medications Prior to Admission medications   Medication Sig Start Date End Date Taking? Authorizing Provider  clonazePAM (KLONOPIN) 0.5 MG tablet Take 0.5 mg by mouth every 8 (eight) hours as needed for anxiety. For restlessness 02/25/18   [provider]  etonogestrel (IMPLANON) 68 MG IMPL implant 1 each by Subdermal route once.    [provider]  lamoTRIgine (LAMICTAL) 25 MG tablet Take 25  mg by mouth daily. 05/21/18   [provider]  modafinil (PROVIGIL) 200 MG tablet Take 200 mg by mouth daily.    [provider]  naproxen sodium (ALEVE) 220 MG tablet Take 1 tablet (220 mg total) by mouth 2 (two) times daily as needed (headache, pain). 07/10/18   Little, Wenda Overland, MD  promethazine-dextromethorphan (PROMETHAZINE-DM) 6.25-15 MG/5ML syrup Take 5 mLs by mouth 4 (four) times daily as needed for cough. 07/10/18   Little, Wenda Overland, MD  Pseudoeph-Doxylamine-DM-APAP (NYQUIL PO) Take 30 mLs by mouth daily as needed (cough/flu).    [provider]  QUEtiapine (SEROQUEL) 400 MG tablet Take 400 mg by mouth at bedtime. 02/25/18   [provider]  traZODone (DESYREL) 100 MG tablet Take 100-200 mg by mouth at bedtime as needed for sleep. 02/25/18   [provider]  albuterol (PROVENTIL HFA;VENTOLIN HFA) 108 (90 Base) MCG/ACT inhaler Inhale 1-2 puffs into the lungs every 6 (six) hours as needed for wheezing or shortness of breath. Patient not taking: Reported on 07/10/2018 01/01/17 11/01/18  Marney Setting, NP  fluticasone [facility]) 50 MCG/ACT  nasal spray Place 2 sprays into both nostrils daily. Patient not taking: Reported on 07/10/2018 01/16/17 11/01/18  Ivar Drape D, PA    Allergies    Erythromycin  Review of Systems   Review of Systems  Constitutional: Positive for chills and fever.  HENT: Positive for congestion.   Respiratory: Positive for cough.   Gastrointestinal: Positive for diarrhea, nausea and vomiting.  Genitourinary: Negative for dysuria.  Musculoskeletal: Positive for myalgias.  Neurological: Positive for headaches.  All other systems reviewed and are negative.   Physical Exam Updated Vital Signs BP 112/74 (BP Location: Right Arm)   Pulse (!) 126   Temp 98.7 F (37.1 C) (Oral)   Resp 18   Ht 5\' 1"  (1.549 m)   Wt 72.6 kg   SpO2 98%   BMI 30.23 kg/m   Physical Exam Vitals and nursing note reviewed.    Constitutional:      General: She is not in acute distress.    Appearance: Normal appearance. She is well-developed and normal weight.  HENT:     Head: Normocephalic and atraumatic.  Eyes:     Pupils: Pupils are equal, round, and reactive to light.  Cardiovascular:     Rate and Rhythm: Regular rhythm. Tachycardia present.     Heart sounds: Normal heart sounds. No murmur. No friction rub.  Pulmonary:     Effort: Pulmonary effort is normal.     Breath sounds: Normal breath sounds. No wheezing or rales.  Abdominal:     General: Bowel sounds are normal. There is no distension.     Palpations: Abdomen is soft.     Tenderness: There is no abdominal tenderness. There is no guarding or rebound.  Musculoskeletal:        General: No tenderness. Normal range of motion.     Comments: No edema  Skin:    General: Skin is warm and dry.     Findings: No rash.  Neurological:     General: No focal deficit present.     Mental Status: She is alert and oriented to person, place, and time. Mental status is at baseline.     Cranial Nerves: No cranial nerve deficit.  Psychiatric:        Mood and Affect: Mood normal.        Behavior: Behavior normal.        Thought Content: Thought content normal.     ED Results / Procedures / Treatments   Labs (all labs ordered are listed, but only abnormal results are displayed) Labs Reviewed  POC SARS CORONAVIRUS 2 AG -  ED    EKG None  Radiology No results found.  Procedures Procedures (including critical care time)  Medications Ordered in ED Medications  lactated ringers bolus 1,000 mL (1,000 mLs Intravenous New Bag/Given 05/20/19 1056)  ondansetron (ZOFRAN) injection 4 mg (4 mg Intravenous Given 05/20/19 1054)  acetaminophen (TYLENOL) tablet 1,000 mg (1,000 mg Oral Given 05/20/19 1054)    ED Course  I have reviewed the triage vital signs and the nursing notes.  Pertinent labs & imaging results that were available during my care of the patient  were reviewed by me and considered in my medical decision making (see chart for details).    MDM Rules/Calculators/A&P                      Joan Simpson was evaluated in Emergency Department on 05/20/2019 for the symptoms described in the history of present illness. She  was evaluated in the context of the global COVID-19 pandemic, which necessitated consideration that the patient might be at risk for infection with the SARS-CoV-2 virus that causes COVID-19. Institutional protocols and algorithms that pertain to the evaluation of patients at risk for COVID-19 are in a state of rapid change based on information released by regulatory bodies including the CDC and federal and state organizations. These policies and algorithms were followed during the patient's care in the ED.  Pt with symptoms consistent with COVID.  Well appearing here.  No signs of breathing difficulty  No signs of pharyngitis, otitis or abnormal abdominal findings. Tachy here but most likely due to dehydration from diarrhea/vomiting and fever. Pt given IVF/zofran and covid test pending.     Final Clinical Impression(s) / ED Diagnoses Final diagnoses:  Suspected COVID-19 virus infection    Rx / DC Orders ED Discharge Orders    None       Blanchie Dessert, MD 05/20/19 1243

## 2019-05-20 NOTE — ED Triage Notes (Signed)
Fever, cough x 2 days; some n/v, reports loss of taste today

## 2019-09-22 ENCOUNTER — Emergency Department (HOSPITAL_COMMUNITY): Payer: Managed Care, Other (non HMO)

## 2019-09-22 ENCOUNTER — Other Ambulatory Visit: Payer: Self-pay

## 2019-09-22 ENCOUNTER — Encounter (HOSPITAL_COMMUNITY): Payer: Self-pay

## 2019-09-22 ENCOUNTER — Emergency Department (HOSPITAL_COMMUNITY)
Admission: EM | Admit: 2019-09-22 | Discharge: 2019-09-22 | Disposition: A | Discharge status: Home / Self Care | Payer: Managed Care, Other (non HMO) | Attending: Emergency Medicine | Admitting: Emergency Medicine

## 2019-09-22 DIAGNOSIS — R519 Headache, unspecified: Secondary | ICD-10-CM

## 2019-09-22 DIAGNOSIS — R55 Syncope and collapse: Secondary | ICD-10-CM | POA: Insufficient documentation

## 2019-09-22 DIAGNOSIS — R0789 Other chest pain: Secondary | ICD-10-CM | POA: Insufficient documentation

## 2019-09-22 DIAGNOSIS — R112 Nausea with vomiting, unspecified: Secondary | ICD-10-CM | POA: Insufficient documentation

## 2019-09-22 DIAGNOSIS — Z20822 Contact with and (suspected) exposure to covid-19: Secondary | ICD-10-CM | POA: Diagnosis not present

## 2019-09-22 DIAGNOSIS — F319 Bipolar disorder, unspecified: Secondary | ICD-10-CM | POA: Insufficient documentation

## 2019-09-22 DIAGNOSIS — I10 Essential (primary) hypertension: Secondary | ICD-10-CM | POA: Diagnosis not present

## 2019-09-22 DIAGNOSIS — R0602 Shortness of breath: Secondary | ICD-10-CM | POA: Diagnosis not present

## 2019-09-22 DIAGNOSIS — Z79899 Other long term (current) drug therapy: Secondary | ICD-10-CM | POA: Insufficient documentation

## 2019-09-22 DIAGNOSIS — R079 Chest pain, unspecified: Secondary | ICD-10-CM

## 2019-09-22 LAB — CBC
HCT: 30.4 % — ABNORMAL LOW (ref 36.0–46.0)
Hemoglobin: 9 g/dL — ABNORMAL LOW (ref 12.0–15.0)
MCH: 22.8 pg — ABNORMAL LOW (ref 26.0–34.0)
MCHC: 29.6 g/dL — ABNORMAL LOW (ref 30.0–36.0)
MCV: 77.2 fL — ABNORMAL LOW (ref 80.0–100.0)
Platelets: 516 10*3/uL — ABNORMAL HIGH (ref 150–400)
RBC: 3.94 MIL/uL (ref 3.87–5.11)
RDW: 17.3 % — ABNORMAL HIGH (ref 11.5–15.5)
WBC: 6.6 10*3/uL (ref 4.0–10.5)
nRBC: 0 % (ref 0.0–0.2)

## 2019-09-22 LAB — BASIC METABOLIC PANEL
Anion gap: 7 (ref 5–15)
BUN: 9 mg/dL (ref 6–20)
CO2: 23 mmol/L (ref 22–32)
Calcium: 9.4 mg/dL (ref 8.9–10.3)
Chloride: 108 mmol/L (ref 98–111)
Creatinine, Ser: 0.78 mg/dL (ref 0.44–1.00)
GFR calc Af Amer: >60 mL/min (ref >60)
GFR calc non Af Amer: >60 mL/min (ref >60)
Glucose, Bld: 92 mg/dL (ref 70–99)
Potassium: 3.9 mmol/L (ref 3.5–5.1)
Sodium: 138 mmol/L (ref 135–145)

## 2019-09-22 LAB — SARS CORONAVIRUS 2 BY RT PCR (HOSPITAL ORDER, PERFORMED IN ~~LOC~~ HOSPITAL LAB): SARS Coronavirus 2: NEGATIVE

## 2019-09-22 LAB — CBG MONITORING, ED: Glucose-Capillary: 75 mg/dL (ref 70–99)

## 2019-09-22 LAB — I-STAT BETA HCG BLOOD, ED (MC, WL, AP ONLY): I-stat hCG, quantitative: <5 m[IU]/mL (ref <5)

## 2019-09-22 MED ORDER — PROCHLORPERAZINE EDISYLATE 10 MG/2ML IJ SOLN
10.0000 mg | Freq: Once | INTRAMUSCULAR | Status: AC
  Administered 2019-09-22: 10 mg via INTRAVENOUS
  Filled 2019-09-22: qty 2

## 2019-09-22 MED ORDER — DIPHENHYDRAMINE HCL 50 MG/ML IJ SOLN
25.0000 mg | Freq: Once | INTRAMUSCULAR | Status: AC
  Administered 2019-09-22: 25 mg via INTRAVENOUS
  Filled 2019-09-22: qty 1

## 2019-09-22 MED ORDER — IOHEXOL 350 MG/ML SOLN
80.0000 mL | Freq: Once | INTRAVENOUS | Status: AC | PRN
  Administered 2019-09-22: 80 mL via INTRAVENOUS

## 2019-09-22 NOTE — ED Provider Notes (Signed)
[facility] Provider Note   CSN: [id number] Arrival date & time: 09/22/19  1122     History Chief Complaint  Patient presents with  . Loss of Consciousness  . Chest Pain    Joan Simpson is a 29 y.o. female.  HPI    29yo female with history of bipolar, ht, presents with concern for lightheadedness, syncopal episode while at work.  Started to feel lightheaded, chest tightness and then woke up in the ambulance. Also had shortness of breath. Off and on lightheaded for the last few days Went to work with a headache, woke with migraine this AM, ate french toast, took prilosec and tums for acid reflux Under stress right now Tingling and shaking when woke up No seizure like activity Hx of panic attack but nothing like this Hx of migraines, worse with bright lights loud sounds   No leg pain or swelling No hx of DVT or PE Does have nexplanon in place  Past Medical History:  Diagnosis Date  . Allergy   . Anxiety   . Bipolar 1 disorder ([place])   . Depression   . Hypertension     There are no problems to display for this patient.   Past Surgical History:  Procedure Laterality Date  . NO PAST SURGERIES       OB History    Gravida  1   Para      Term      Preterm      AB      Living  0     SAB      TAB      Ectopic      Multiple      Live Births              Family History  Problem Relation Age of Onset  . Hypertension Mother     Social History   Tobacco Use  . Smoking status: Never Smoker  . Smokeless tobacco: Never Used  Substance Use Topics  . Alcohol use: No    Comment: occa  . Drug use: No    Home Medications Prior to Admission medications   Medication Sig Start Date End Date Taking? Authorizing Provider  clonazePAM (KLONOPIN) 0.5 MG tablet Take 0.5 mg by mouth every 8 (eight) hours as needed for anxiety. For restlessness 02/25/18   [provider]  etonogestrel (IMPLANON) 68  MG IMPL implant 1 each by Subdermal route once.    [provider]  lamoTRIgine (LAMICTAL) 25 MG tablet Take 25 mg by mouth daily. 05/21/18   [provider]  modafinil (PROVIGIL) 200 MG tablet Take 200 mg by mouth daily.    [provider]  naproxen sodium (ALEVE) 220 MG tablet Take 1 tablet (220 mg total) by mouth 2 (two) times daily as needed (headache, pain). 07/10/18   Little, Wenda Overland, MD  ondansetron (ZOFRAN) 4 MG tablet Take 1 tablet (4 mg total) by mouth every 6 (six) hours. 05/20/19   Blanchie Dessert, MD  promethazine-dextromethorphan (PROMETHAZINE-DM) 6.25-15 MG/5ML syrup Take 5 mLs by mouth 4 (four) times daily as needed for cough. 07/10/18   Little, Wenda Overland, MD  Pseudoeph-Doxylamine-DM-APAP (NYQUIL PO) Take 30 mLs by mouth daily as needed (cough/flu).    [provider]  QUEtiapine (SEROQUEL) 400 MG tablet Take 400 mg by mouth at bedtime. 02/25/18   [provider]  traZODone (DESYREL) 100 MG tablet Take 100-200 mg by mouth at bedtime as needed  for sleep. 02/25/18   [provider]  albuterol (PROVENTIL HFA;VENTOLIN HFA) 108 (90 Base) MCG/ACT inhaler Inhale 1-2 puffs into the lungs every 6 (six) hours as needed for wheezing or shortness of breath. Patient not taking: Reported on 07/10/2018 01/01/17 11/01/18  Marney Setting, NP  fluticasone [facility]) 50 MCG/ACT nasal spray Place 2 sprays into both nostrils daily. Patient not taking: Reported on 07/10/2018 01/16/17 11/01/18  Ivar Drape D, PA    Allergies    Erythromycin  Review of Systems   Review of Systems  Constitutional: Negative for fever.  HENT: Negative for sore throat.   Eyes: Negative for visual disturbance.  Respiratory: Positive for shortness of breath. Negative for cough.   Cardiovascular: Positive for chest pain. Negative for leg swelling.  Gastrointestinal: Positive for nausea and vomiting. Negative for abdominal pain, constipation and diarrhea.    Genitourinary: Negative for difficulty urinating.  Musculoskeletal: Negative for back pain and neck pain.  Skin: Negative for rash.  Neurological: Positive for syncope, light-headedness and headaches.    Physical Exam Updated Vital Signs BP 110/72 (BP Location: Right Arm)   Pulse (!) 107   Temp 98.7 F (37.1 C) (Oral)   Resp 16   Ht 5\' 1"  (1.549 m)   Wt 72 kg   SpO2 99%   BMI 29.99 kg/m   Physical Exam Vitals and nursing note reviewed.  Constitutional:      General: She is not in acute distress.    Appearance: She is well-developed. She is not diaphoretic.  HENT:     Head: Normocephalic and atraumatic.  Eyes:     Conjunctiva/sclera: Conjunctivae normal.  Cardiovascular:     Rate and Rhythm: Regular rhythm. Tachycardia present.     Heart sounds: Normal heart sounds. No murmur. No friction rub. No gallop.   Pulmonary:     Effort: Pulmonary effort is normal. No respiratory distress.     Breath sounds: Normal breath sounds. No wheezing or rales.  Abdominal:     General: There is no distension.     Palpations: Abdomen is soft.     Tenderness: There is no abdominal tenderness. There is no guarding.  Musculoskeletal:        General: No tenderness.     Cervical back: Normal range of motion.  Skin:    General: Skin is warm and dry.     Findings: No erythema or rash.  Neurological:     Mental Status: She is alert and oriented to person, place, and time.     ED Results / Procedures / Treatments   Labs (all labs ordered are listed, but only abnormal results are displayed) Labs Reviewed  CBC - Abnormal; Notable for the following components:      Result Value   Hemoglobin 9.0 (*)    HCT 30.4 (*)    MCV 77.2 (*)    MCH 22.8 (*)    MCHC 29.6 (*)    RDW 17.3 (*)    Platelets 516 (*)    All other components within normal limits  SARS CORONAVIRUS 2 BY RT PCR (HOSPITAL ORDER, [facility])  BASIC METABOLIC PANEL  URINALYSIS, ROUTINE W REFLEX  MICROSCOPIC  CBG MONITORING, ED  I-STAT BETA HCG BLOOD, ED ([facility], [facility], [facility] ONLY)    EKG EKG Interpretation  Date/Time:  Monday September 22 2019 11:28:49 EDT Ventricular Rate:  107 PR Interval:  156 QRS Duration: 80 QT Interval:  324 QTC Calculation: 432 R Axis:  75 Text Interpretation: Sinus tachycardia Otherwise normal ECG No significant change since last tracing Confirmed by Gareth Morgan [phone]) on 09/22/2019 4:13:59 PM   Radiology CT Head Wo Contrast  Result Date: 09/22/2019 CLINICAL DATA:  Head trauma, headache. Additional history provided: Syncopal episode EXAM: CT HEAD WITHOUT CONTRAST TECHNIQUE: Contiguous axial images were obtained from the base of the skull through the vertex without intravenous contrast. COMPARISON:  Head CT 12/27/2011. FINDINGS: Brain: There is no acute intracranial hemorrhage. No demarcated cortical infarct. No extra-axial fluid collection. No evidence of intracranial mass. No midline shift. Vascular: No hyperdense vessel. Skull: Normal. Negative for fracture or focal lesion. Sinuses/Orbits: Visualized orbits show no acute finding. Near complete opacification of the left sphenoid sinus. No significant mastoid effusion. IMPRESSION: Unremarkable CT appearance of the brain. No evidence of acute intracranial abnormality. Left sphenoid sinusitis. Electronically Signed   By: Kellie Simmering DO   On: 09/22/2019 18:12   CT Angio Chest PE W and/or Wo Contrast  Result Date: 09/22/2019 CLINICAL DATA:  29 year old female with syncope, chest tightness, dizziness, tachypnea, diaphoresis. EXAM: CT ANGIOGRAPHY CHEST WITH CONTRAST TECHNIQUE: Multidetector CT imaging of the chest was performed using the standard protocol during bolus administration of intravenous contrast. Multiplanar CT image reconstructions and MIPs were obtained to evaluate the vascular anatomy. CONTRAST:  33mL OMNIPAQUE IOHEXOL 350 MG/ML SOLN COMPARISON:  Portable chest 10/31/2018. CT Abdomen and Pelvis 02/27/2018.  FINDINGS: Cardiovascular: Good contrast bolus timing in the pulmonary arterial tree. Minor respiratory motion. No focal filling defect identified in the pulmonary arteries to suggest acute pulmonary embolism. No atherosclerosis is evident in the chest. Cardiac size within normal limits. No pericardial effusion. Mediastinum/Nodes: No mediastinal lymphadenopathy. Moderate gastric hiatal hernia is chronic and appears mildly increased since 2019. Lungs/Pleura: Major airways are patent. Both lungs appear clear aside from minor dependent atelectasis. No pleural effusion. Upper Abdomen: Negative visible liver, spleen, intra-abdominal stomach. Musculoskeletal: Mild T10 superior endplate deformity is unchanged from 2019 and probably related to a degenerative Schmorl's node. No acute osseous abnormality identified. Review of the MIP images confirms the above findings. IMPRESSION: 1. Negative for acute pulmonary embolus. No acute findings in the chest. 2. Chronic moderate gastric hiatal hernia. Study discussed by telephone with Dr. Gareth Morgan on 09/22/2019 at 18:15 . Electronically Signed   By: Genevie Ann M.D.   On: 09/22/2019 18:17    Procedures Procedures (including critical care time)  Medications Ordered in ED Medications  prochlorperazine (COMPAZINE) injection 10 mg (10 mg Intravenous Given 09/22/19 1633)  diphenhydrAMINE (BENADRYL) injection 25 mg (25 mg Intravenous Given 09/22/19 1633)  iohexol (OMNIPAQUE) 350 MG/ML injection 80 mL (80 mLs Intravenous Contrast Given 09/22/19 1737)    ED Course  I have reviewed the triage vital signs and the nursing notes.  Pertinent labs & imaging results that were available during my care of the patient were reviewed by me and considered in my medical decision making (see chart for details).    MDM Rules/Calculators/A&P                      29yo female with history of bipolar, ht, presents with concern for lightheadedness, syncopal episode while at work in setting of  having a headache and developing chest tightness and dyspnea prior to syncope.   CT head without acute findings, no sign of SAH. Exam does not show signs of CVA.  Headache improved with medications.  Anemia hgb 9, no history of bleeding, do not suspect this is etiology  of syncopal episode.  No significant electrolyte abnormality. Sinus tachycardia without other significant findings on ECG.  Given dyspnea, nexplanon, tachycardia and syncope-ordered CT PE study.   CT does not show evidence of PE.  Patient did leave AMA prior to result but called to discuss normal finding.  Recommend PCP follow up.    Final Clinical Impression(s) / ED Diagnoses Final diagnoses:  Syncope, unspecified syncope type  Chest pain, unspecified type  Shortness of breath  Acute nonintractable headache, unspecified headache type    Rx / DC Orders ED Discharge Orders    None       Gareth Morgan, MD 09/22/19 2311

## 2019-09-22 NOTE — ED Notes (Signed)
Pt leaving AMA. Pt verbalized understanding of the risks of leaving, MD notified and spoke to pt about risks. Pt still saying she would like to leave. IV removed, pt walked out of department.

## 2019-09-22 NOTE — ED Notes (Signed)
Pt requested for her IV to be taken out because she hates needles.

## 2019-09-22 NOTE — ED Triage Notes (Signed)
Pt arrives POV for eval of syncopal episode, anxiety and chest tightness. Pt was seated in her office and noted she felt lightheaded and dizzy, chest tightness. Pt then laid on the ground and had syncopal episode. On EMS arrival she was hyperventilating and diaphoretic. EMS reports she calmed down through coached breathing. States hx of anxiety and out of her klonopin. Pt reports she feels much better on arrival, endorses fatigue.

## 2020-02-09 ENCOUNTER — Emergency Department (HOSPITAL_COMMUNITY)
Admission: EM | Admit: 2020-02-09 | Discharge: 2020-02-09 | Disposition: A | Discharge status: Home / Self Care | Payer: Managed Care, Other (non HMO) | Attending: Emergency Medicine | Admitting: Emergency Medicine

## 2020-02-09 ENCOUNTER — Encounter (HOSPITAL_COMMUNITY): Payer: Self-pay

## 2020-02-09 ENCOUNTER — Other Ambulatory Visit: Payer: Self-pay

## 2020-02-09 DIAGNOSIS — R509 Fever, unspecified: Secondary | ICD-10-CM | POA: Diagnosis not present

## 2020-02-09 DIAGNOSIS — R000 Tachycardia, unspecified: Secondary | ICD-10-CM | POA: Diagnosis not present

## 2020-02-09 DIAGNOSIS — I10 Essential (primary) hypertension: Secondary | ICD-10-CM | POA: Diagnosis not present

## 2020-02-09 DIAGNOSIS — R109 Unspecified abdominal pain: Secondary | ICD-10-CM | POA: Insufficient documentation

## 2020-02-09 DIAGNOSIS — G43909 Migraine, unspecified, not intractable, without status migrainosus: Secondary | ICD-10-CM | POA: Insufficient documentation

## 2020-02-09 DIAGNOSIS — R519 Headache, unspecified: Secondary | ICD-10-CM | POA: Diagnosis present

## 2020-02-09 DIAGNOSIS — Z20822 Contact with and (suspected) exposure to covid-19: Secondary | ICD-10-CM | POA: Diagnosis not present

## 2020-02-09 DIAGNOSIS — R112 Nausea with vomiting, unspecified: Secondary | ICD-10-CM

## 2020-02-09 LAB — COMPREHENSIVE METABOLIC PANEL
ALT: 19 U/L (ref 0–44)
AST: 20 U/L (ref 15–41)
Albumin: 4.5 g/dL (ref 3.5–5.0)
Alkaline Phosphatase: 63 U/L (ref 38–126)
Anion gap: 13 (ref 5–15)
BUN: 15 mg/dL (ref 6–20)
CO2: 23 mmol/L (ref 22–32)
Calcium: 9.4 mg/dL (ref 8.9–10.3)
Chloride: 102 mmol/L (ref 98–111)
Creatinine, Ser: 0.9 mg/dL (ref 0.44–1.00)
GFR, Estimated: >60 mL/min (ref >60)
Glucose, Bld: 91 mg/dL (ref 70–99)
Potassium: 3.6 mmol/L (ref 3.5–5.1)
Sodium: 138 mmol/L (ref 135–145)
Total Bilirubin: 0.3 mg/dL (ref 0.3–1.2)
Total Protein: 8.4 g/dL — ABNORMAL HIGH (ref 6.5–8.1)

## 2020-02-09 LAB — CBC WITH DIFFERENTIAL/PLATELET
Abs Immature Granulocytes: 0.02 10*3/uL (ref 0.00–0.07)
Basophils Absolute: 0 10*3/uL (ref 0.0–0.1)
Basophils Relative: 0 %
Eosinophils Absolute: 0.1 10*3/uL (ref 0.0–0.5)
Eosinophils Relative: 1 %
HCT: 31.3 % — ABNORMAL LOW (ref 36.0–46.0)
Hemoglobin: 9.5 g/dL — ABNORMAL LOW (ref 12.0–15.0)
Immature Granulocytes: 0 %
Lymphocytes Relative: 36 %
Lymphs Abs: 3.8 10*3/uL (ref 0.7–4.0)
MCH: 22.4 pg — ABNORMAL LOW (ref 26.0–34.0)
MCHC: 30.4 g/dL (ref 30.0–36.0)
MCV: 73.6 fL — ABNORMAL LOW (ref 80.0–100.0)
Monocytes Absolute: 0.7 10*3/uL (ref 0.1–1.0)
Monocytes Relative: 7 %
Neutro Abs: 5.9 10*3/uL (ref 1.7–7.7)
Neutrophils Relative %: 56 %
Platelets: 547 10*3/uL — ABNORMAL HIGH (ref 150–400)
RBC: 4.25 MIL/uL (ref 3.87–5.11)
RDW: 18.2 % — ABNORMAL HIGH (ref 11.5–15.5)
WBC: 10.5 10*3/uL (ref 4.0–10.5)
nRBC: 0 % (ref 0.0–0.2)

## 2020-02-09 LAB — RESPIRATORY PANEL BY RT PCR (FLU A&B, COVID)
Influenza A by PCR: NEGATIVE
Influenza B by PCR: NEGATIVE
SARS Coronavirus 2 by RT PCR: NEGATIVE

## 2020-02-09 LAB — I-STAT BETA HCG BLOOD, ED (MC, WL, AP ONLY): I-stat hCG, quantitative: <5 m[IU]/mL (ref <5)

## 2020-02-09 MED ORDER — KETOROLAC TROMETHAMINE 30 MG/ML IJ SOLN
30.0000 mg | Freq: Once | INTRAMUSCULAR | Status: AC
  Administered 2020-02-09: 30 mg via INTRAVENOUS
  Filled 2020-02-09: qty 1

## 2020-02-09 MED ORDER — ONDANSETRON 4 MG PO TBDP: 4.0000 mg | ORAL_TABLET | Freq: Three times a day (TID) | ORAL | 0 refills | Status: DC | PRN

## 2020-02-09 MED ORDER — DIPHENHYDRAMINE HCL 50 MG/ML IJ SOLN
12.5000 mg | Freq: Once | INTRAMUSCULAR | Status: AC
  Administered 2020-02-09: 12.5 mg via INTRAVENOUS
  Filled 2020-02-09: qty 1

## 2020-02-09 MED ORDER — SODIUM CHLORIDE 0.9 % IV BOLUS
1000.0000 mL | Freq: Once | INTRAVENOUS | Status: AC
  Administered 2020-02-09: 1000 mL via INTRAVENOUS

## 2020-02-09 MED ORDER — METOCLOPRAMIDE HCL 5 MG/ML IJ SOLN
10.0000 mg | Freq: Once | INTRAMUSCULAR | Status: AC
  Administered 2020-02-09: 10 mg via INTRAVENOUS
  Filled 2020-02-09: qty 2

## 2020-02-09 NOTE — ED Notes (Signed)
Pt given water for PO challenge 

## 2020-02-09 NOTE — ED Triage Notes (Signed)
Patient c/o headache, vomiting, chills, and dizziness x 2 days.

## 2020-02-09 NOTE — Discharge Instructions (Signed)
You were seen in the emergency department for evaluation of headache and nausea and vomiting.  You had blood work that did not show any significant abnormalities other than you are mildly anemic but unchanged from prior.  Your symptoms improved with some fluid and medications.  Please continue to drink plenty of fluids.  We are sending you home with a prescription for some nausea medicine.  Return to the emergency department for any high fevers or worsening symptoms.

## 2020-02-09 NOTE — ED Provider Notes (Signed)
[facility] DEPT Provider Note   CSN: [id number] Arrival date & time: 02/09/20  1604     History Chief Complaint  Patient presents with  . Emesis  . Headache  . Chills  . Dizziness    Joan Simpson is a 29 y.o. female. She is here with a complaint of 2 days of headache nausea vomiting abdominal cramps dizziness lightheadedness chills. She has been Covid vaccinated. No sick contacts or recent travel. History of migraines remote and says this feels like that. Been trying to eat saltines and drink fluids without any improvement.  The history is provided by the patient.  Emesis Severity:  Moderate Duration:  2 days Timing:  Intermittent Quality:  Stomach contents Progression:  Unchanged Chronicity:  New Relieved by:  Nothing Worsened by:  Liquids Ineffective treatments:  None tried Associated symptoms: abdominal pain, chills, fever and headaches   Associated symptoms: no cough, no diarrhea, no myalgias and no sore throat   Headache Associated symptoms: abdominal pain, dizziness, fever, nausea and vomiting   Associated symptoms: no cough, no diarrhea, no myalgias and no sore throat   Dizziness Associated symptoms: headaches, nausea and vomiting   Associated symptoms: no chest pain and no diarrhea        Past Medical History:  Diagnosis Date  . Allergy   . Anxiety   . Bipolar 1 disorder ([place])   . Depression   . Hypertension     There are no problems to display for this patient.   Past Surgical History:  Procedure Laterality Date  . NO PAST SURGERIES       OB History    Gravida  1   Para      Term      Preterm      AB      Living  0     SAB      TAB      Ectopic      Multiple      Live Births              Family History  Problem Relation Age of Onset  . Hypertension Mother     Social History   Tobacco Use  . Smoking status: Never Smoker  . Smokeless tobacco: Never Used  Vaping Use  . Vaping  Use: Never assessed  Substance Use Topics  . Alcohol use: Yes  . Drug use: No    Home Medications Prior to Admission medications   Medication Sig Start Date End Date Taking? Authorizing Provider  clonazePAM (KLONOPIN) 0.5 MG tablet Take 0.5 mg by mouth every 8 (eight) hours as needed for anxiety. For restlessness 02/25/18   [provider]  etonogestrel (IMPLANON) 68 MG IMPL implant 1 each by Subdermal route once.    [provider]  lamoTRIgine (LAMICTAL) 25 MG tablet Take 25 mg by mouth daily. 05/21/18   [provider]  modafinil (PROVIGIL) 200 MG tablet Take 200 mg by mouth daily.    [provider]  naproxen sodium (ALEVE) 220 MG tablet Take 1 tablet (220 mg total) by mouth 2 (two) times daily as needed (headache, pain). 07/10/18   Little, Wenda Overland, MD  ondansetron (ZOFRAN) 4 MG tablet Take 1 tablet (4 mg total) by mouth every 6 (six) hours. 05/20/19   Blanchie Dessert, MD  promethazine-dextromethorphan (PROMETHAZINE-DM) 6.25-15 MG/5ML syrup Take 5 mLs by mouth 4 (four) times daily as needed for cough. 07/10/18   Little, Wenda Overland, MD  Pseudoeph-Doxylamine-DM-APAP (NYQUIL PO) Take 30 mLs by mouth daily as needed (cough/flu).    [provider]  QUEtiapine (SEROQUEL) 400 MG tablet Take 400 mg by mouth at bedtime. 02/25/18   [provider]  traZODone (DESYREL) 100 MG tablet Take 100-200 mg by mouth at bedtime as needed for sleep. 02/25/18   [provider]  albuterol (PROVENTIL HFA;VENTOLIN HFA) 108 (90 Base) MCG/ACT inhaler Inhale 1-2 puffs into the lungs every 6 (six) hours as needed for wheezing or shortness of breath. Patient not taking: Reported on 07/10/2018 01/01/17 11/01/18  Marney Setting, NP  fluticasone [facility]) 50 MCG/ACT nasal spray Place 2 sprays into both nostrils daily. Patient not taking: Reported on 07/10/2018 01/16/17 11/01/18  Ivar Drape D, PA    Allergies    Erythromycin  Review of Systems    Review of Systems  Constitutional: Positive for chills and fever.  HENT: Negative for sore throat.   Eyes: Negative for visual disturbance.  Respiratory: Negative for cough.   Cardiovascular: Negative for chest pain.  Gastrointestinal: Positive for abdominal pain, nausea and vomiting. Negative for diarrhea.  Genitourinary: Negative for dysuria.  Musculoskeletal: Negative for myalgias.  Skin: Negative for rash.  Neurological: Positive for dizziness and headaches.    Physical Exam Updated Vital Signs BP 114/77 (BP Location: Right Arm)   Pulse (!) 112   Temp 98.4 F (36.9 C) (Oral)   Resp 18   Ht 5\' 1"  (1.549 m)   Wt 74.8 kg   SpO2 96%   BMI 31.18 kg/m   Physical Exam Vitals and nursing note reviewed.  Constitutional:      General: She is not in acute distress.    Appearance: Normal appearance. She is well-developed.  HENT:     Head: Normocephalic and atraumatic.  Eyes:     Conjunctiva/sclera: Conjunctivae normal.  Cardiovascular:     Rate and Rhythm: Regular rhythm. Tachycardia present.     Heart sounds: No murmur heard.   Pulmonary:     Effort: Pulmonary effort is normal. No respiratory distress.     Breath sounds: Normal breath sounds.  Abdominal:     Palpations: Abdomen is soft.     Tenderness: There is no abdominal tenderness. There is no guarding or rebound.  Musculoskeletal:        General: No deformity or signs of injury. Normal range of motion.     Cervical back: Neck supple.  Skin:    General: Skin is warm and dry.     Capillary Refill: Capillary refill takes less than 2 seconds.  Neurological:     Mental Status: She is alert.     GCS: GCS eye subscore is 4. GCS verbal subscore is 5. GCS motor subscore is 6.     Cranial Nerves: No cranial nerve deficit.     Sensory: No sensory deficit.     Motor: No weakness.     ED Results / Procedures / Treatments   Labs (all labs ordered are listed, but only abnormal results are displayed) Labs Reviewed  CBC  WITH DIFFERENTIAL/PLATELET - Abnormal; Notable for the following components:      Result Value   Hemoglobin 9.5 (*)    HCT 31.3 (*)    MCV 73.6 (*)    MCH 22.4 (*)    RDW 18.2 (*)    Platelets 547 (*)    All other components within normal limits  COMPREHENSIVE METABOLIC PANEL - Abnormal; Notable for the following components:   Total Protein 8.4 (*)  All other components within normal limits  RESPIRATORY PANEL BY RT PCR (FLU A&B, COVID)  I-STAT BETA HCG BLOOD, ED ([facility], [facility], [facility] ONLY)    EKG None  Radiology No results found.  Procedures Procedures (including critical care time)  Medications Ordered in ED Medications  sodium chloride 0.9 % bolus 1,000 mL (0 mLs Intravenous Stopped 02/09/20 2200)  metoCLOPramide (REGLAN) injection 10 mg (10 mg Intravenous Given 02/09/20 2019)  ketorolac (TORADOL) 30 MG/ML injection 30 mg (30 mg Intravenous Given 02/09/20 2019)  diphenhydrAMINE (BENADRYL) injection 12.5 mg (12.5 mg Intravenous Given 02/09/20 2018)    ED Course  I have reviewed the triage vital signs and the nursing notes.  Pertinent labs & imaging results that were available during my care of the patient were reviewed by me and considered in my medical decision making (see chart for details).  Clinical Course as of Feb 09 1013  Mon Feb 09, 2020  2159 Reassessed patient and she said she is feeling better.  Will p.o. challenge.   [MB]    Clinical Course User Index [MB] Hayden Rasmussen, MD   MDM Rules/Calculators/A&P                         This patient complains of headache nausea vomiting chills; this involves an extensive number of treatment Options and is a complaint that carries with it a high risk of complications and Morbidity. The differential includes migraine, viral syndrome, Covid, metabolic derangement  I ordered, reviewed and interpreted labs, which included CBC with normal white count, low hemoglobin stable from priors, elevated platelets likely reactive,  chemistries and LFTs normal, pregnancy test negative, Covid testing and flu testing negative I ordered medication IV fluids Reglan Toradol with improvement in her symptoms Previous records obtained and reviewed in epic, no recent admissions  After the interventions stated above, I reevaluated the patient and found patient to be symptomatically improved.  She is tolerating p.o. and comfortable with plan for discharge.  Return instructions discussed.  Final Clinical Impression(s) / ED Diagnoses Final diagnoses:  Migraine without status migrainosus, not intractable, unspecified migraine type  Non-intractable vomiting with nausea, unspecified vomiting type    Rx / DC Orders ED Discharge Orders         Ordered    ondansetron (ZOFRAN ODT) 4 MG disintegrating tablet  Every 8 hours PRN        02/09/20 2228           Hayden Rasmussen, MD 02/10/20 1016

## 2020-12-02 ENCOUNTER — Emergency Department (HOSPITAL_COMMUNITY)
Admission: EM | Admit: 2020-12-02 | Discharge: 2020-12-03 | Disposition: A | Discharge status: Home / Self Care | Payer: Managed Care, Other (non HMO) | Attending: Emergency Medicine | Admitting: Emergency Medicine

## 2020-12-02 ENCOUNTER — Other Ambulatory Visit: Payer: Self-pay

## 2020-12-02 DIAGNOSIS — R42 Dizziness and giddiness: Secondary | ICD-10-CM

## 2020-12-02 DIAGNOSIS — I10 Essential (primary) hypertension: Secondary | ICD-10-CM | POA: Insufficient documentation

## 2020-12-02 DIAGNOSIS — R110 Nausea: Secondary | ICD-10-CM | POA: Insufficient documentation

## 2020-12-02 LAB — CBC WITH DIFFERENTIAL/PLATELET
Abs Immature Granulocytes: 0.03 10*3/uL (ref 0.00–0.07)
Basophils Absolute: 0 10*3/uL (ref 0.0–0.1)
Basophils Relative: 0 %
Eosinophils Absolute: 0.1 10*3/uL (ref 0.0–0.5)
Eosinophils Relative: 0 %
HCT: 29.7 % — ABNORMAL LOW (ref 36.0–46.0)
Hemoglobin: 9 g/dL — ABNORMAL LOW (ref 12.0–15.0)
Immature Granulocytes: 0 %
Lymphocytes Relative: 29 %
Lymphs Abs: 3.3 10*3/uL (ref 0.7–4.0)
MCH: 22.7 pg — ABNORMAL LOW (ref 26.0–34.0)
MCHC: 30.3 g/dL (ref 30.0–36.0)
MCV: 75 fL — ABNORMAL LOW (ref 80.0–100.0)
Monocytes Absolute: 0.7 10*3/uL (ref 0.1–1.0)
Monocytes Relative: 6 %
Neutro Abs: 7.5 10*3/uL (ref 1.7–7.7)
Neutrophils Relative %: 65 %
Platelets: 531 10*3/uL — ABNORMAL HIGH (ref 150–400)
RBC: 3.96 MIL/uL (ref 3.87–5.11)
RDW: 18.6 % — ABNORMAL HIGH (ref 11.5–15.5)
WBC: 11.6 10*3/uL — ABNORMAL HIGH (ref 4.0–10.5)
nRBC: 0 % (ref 0.0–0.2)

## 2020-12-02 LAB — BASIC METABOLIC PANEL
Anion gap: 7 (ref 5–15)
BUN: 12 mg/dL (ref 6–20)
CO2: 24 mmol/L (ref 22–32)
Calcium: 9.2 mg/dL (ref 8.9–10.3)
Chloride: 107 mmol/L (ref 98–111)
Creatinine, Ser: 0.75 mg/dL (ref 0.44–1.00)
GFR, Estimated: >60 mL/min (ref >60)
Glucose, Bld: 88 mg/dL (ref 70–99)
Potassium: 3.9 mmol/L (ref 3.5–5.1)
Sodium: 138 mmol/L (ref 135–145)

## 2020-12-02 NOTE — ED Triage Notes (Signed)
Pt with intermittent dizziness x 2 weeks, episodes increasing in frequency and duration and occur while at rest. Has associated headache with episode. Hx chronic migraines.

## 2020-12-02 NOTE — ED Provider Notes (Signed)
Emergency Medicine Provider Triage Evaluation Note  Joan Simpson , a 30 y.o. female  was evaluated in triage.  Pt complains of intermittent headaches and dizziness over the last 2 weeks.  Patient reports that dizziness has always come on with headaches.  Dizziness feels like the entire room is spinning.  Dizziness is worse with movement.  Patient has a history of migraines.  Reports that headache she has experienced over the last 2 weeks are similar to previous migraine she has had.  Pain is located to frontal temporal aspect of the head.  Onset is gradual and progressive worse over time.  No aggravating factors.  No alleviation with OTC meds.    Review of Systems  Positive: Dizziness, headache Negative: Pain, shortness of breath, numbness, weakness, facial asymmetry, aphasia, dysphagia,  Physical Exam  BP 128/85 (BP Location: Right Arm)   Pulse (!) 108   Temp 98.9 F (37.2 C)   Resp 16   SpO2 96%  Gen:   Awake, no distress   Resp:  Normal effort  MSK:   Moves extremities without difficulty  Other:  No facial asymmetry, pronator drift, aphasia or dysphagia.  Medical Decision Making  Medically screening exam initiated at 5:58 PM.  Appropriate orders placed.  Deanette Kalkowski was informed that the remainder of the evaluation will be completed by another provider, this initial triage assessment does not replace that evaluation, and the importance of remaining in the ED until their evaluation is complete.  The patient appears stable so that the remainder of the work up may be completed by another provider.      Loni Beckwith, PA-C [id number] [phone]    Lianne Cure, DO [id number]

## 2020-12-03 MED ORDER — MECLIZINE HCL 25 MG PO TABS: 25.0000 mg | ORAL_TABLET | Freq: Three times a day (TID) | ORAL | 0 refills | Status: DC | PRN

## 2020-12-03 MED ORDER — MECLIZINE HCL 25 MG PO TABS
25.0000 mg | ORAL_TABLET | Freq: Once | ORAL | Status: AC
  Administered 2020-12-03: 25 mg via ORAL
  Filled 2020-12-03: qty 1

## 2020-12-03 NOTE — ED Provider Notes (Signed)
[facility] EMERGENCY DEPARTMENT Provider Note   CSN: [id number] Arrival date & time: 12/02/20  1742     History Chief Complaint  Patient presents with   Dizziness    Joan Simpson is a 30 y.o. female.  Patient is a 30 year old female with past medical history of hypertension, bipolar, anxiety.  Patient presents today for evaluation of dizziness.  She describes a 2-week history of episodic dizziness she describes as a spinning sensation.  This occurs several times throughout the day and last for anywhere between several seconds and 30 seconds.  She describes the room spinning with associated nausea, but no vomiting.  This occurs randomly without relation to movement or change in position.  He denies any other symptoms such as headaches or visual disturbances.  She denies any fevers or chills.  The history is provided by the patient.  Dizziness Quality:  Room spinning Severity:  Moderate Duration:  2 weeks Timing:  Intermittent Progression:  Worsening Chronicity:  New Relieved by:  Nothing Worsened by:  Nothing     Past Medical History:  Diagnosis Date   Allergy    Anxiety    Bipolar 1 disorder ([place])    Depression    Hypertension     There are no problems to display for this patient.   Past Surgical History:  Procedure Laterality Date   NO PAST SURGERIES       OB History     Gravida  1   Para      Term      Preterm      AB      Living  0      SAB      IAB      Ectopic      Multiple      Live Births              Family History  Problem Relation Age of Onset   Hypertension Mother     Social History   Tobacco Use   Smoking status: Never   Smokeless tobacco: Never  Substance Use Topics   Alcohol use: Yes   Drug use: No    Home Medications Prior to Admission medications   Medication Sig Start Date End Date Taking? Authorizing Provider  lamoTRIgine (LAMICTAL) 200 MG tablet Take 200 mg by mouth daily.  01/13/20   [provider]  modafinil (PROVIGIL) 200 MG tablet Take 200 mg by mouth daily.    [provider]  naproxen sodium (ALEVE) 220 MG tablet Take 1 tablet (220 mg total) by mouth 2 (two) times daily as needed (headache, pain). Patient not taking: Reported on 02/09/2020 07/10/18   Little, Wenda Overland, MD  ondansetron (ZOFRAN ODT) 4 MG disintegrating tablet Take 1 tablet (4 mg total) by mouth every 8 (eight) hours as needed for nausea or vomiting. 02/09/20   Hayden Rasmussen, MD  ondansetron (ZOFRAN) 4 MG tablet Take 1 tablet (4 mg total) by mouth every 6 (six) hours. Patient not taking: Reported on 02/09/2020 05/20/19   Blanchie Dessert, MD  promethazine-dextromethorphan (PROMETHAZINE-DM) 6.25-15 MG/5ML syrup Take 5 mLs by mouth 4 (four) times daily as needed for cough. Patient not taking: Reported on 02/09/2020 07/10/18   Little, Wenda Overland, MD  QUEtiapine (SEROQUEL) 300 MG tablet Take 300 mg by mouth at bedtime. 01/07/20   [provider]  albuterol (PROVENTIL HFA;VENTOLIN HFA) 108 (90 Base) MCG/ACT inhaler Inhale 1-2 puffs into the lungs every 6 (six) hours as  needed for wheezing or shortness of breath. Patient not taking: Reported on 07/10/2018 01/01/17 11/01/18  Marney Setting, NP  fluticasone [facility] 50 MCG/ACT nasal spray Place 2 sprays into both nostrils daily. Patient not taking: Reported on 07/10/2018 01/16/17 11/01/18  Ivar Drape D, PA    Allergies    Erythromycin  Review of Systems   Review of Systems  Neurological:  Positive for dizziness.  All other systems reviewed and are negative.  Physical Exam Updated Vital Signs BP 105/78 (BP Location: Right Arm)   Pulse 85   Temp 98.7 F (37.1 C) (Oral)   Resp (!) 22   SpO2 97%   Physical Exam Vitals and nursing note reviewed.  Constitutional:      General: She is not in acute distress.    Appearance: She is well-developed. She is not diaphoretic.  HENT:     Head: Normocephalic  and atraumatic.     Right Ear: Tympanic membrane normal.     Left Ear: Tympanic membrane normal.  Eyes:     Extraocular Movements: Extraocular movements intact.     Pupils: Pupils are equal, round, and reactive to light.  Cardiovascular:     Rate and Rhythm: Normal rate and regular rhythm.     Heart sounds: No murmur heard.   No friction rub. No gallop.  Pulmonary:     Effort: Pulmonary effort is normal. No respiratory distress.     Breath sounds: Normal breath sounds. No wheezing.  Abdominal:     General: Bowel sounds are normal. There is no distension.     Palpations: Abdomen is soft.     Tenderness: There is no abdominal tenderness.  Musculoskeletal:        General: Normal range of motion.     Cervical back: Normal range of motion and neck supple.  Skin:    General: Skin is warm and dry.  Neurological:     General: No focal deficit present.     Mental Status: She is alert and oriented to person, place, and time.     Cranial Nerves: No cranial nerve deficit.     Motor: No weakness.     Coordination: Coordination normal.    ED Results / Procedures / Treatments   Labs (all labs ordered are listed, but only abnormal results are displayed) Labs Reviewed  CBC WITH DIFFERENTIAL/PLATELET - Abnormal; Notable for the following components:      Result Value   WBC 11.6 (*)    Hemoglobin 9.0 (*)    HCT 29.7 (*)    MCV 75.0 (*)    MCH 22.7 (*)    RDW 18.6 (*)    Platelets 531 (*)    All other components within normal limits  BASIC METABOLIC PANEL    EKG None  Radiology No results found.  Procedures Procedures   Medications Ordered in ED Medications  meclizine (ANTIVERT) tablet 25 mg (has no administration in time range)    ED Course  I have reviewed the triage vital signs and the nursing notes.  Pertinent labs & imaging results that were available during my care of the patient were reviewed by me and considered in my medical decision making (see chart for  details).    MDM Rules/Calculators/A&P  Patient presents with dizziness described as a spinning sensation.  I highly suspect a peripheral vertigo.  Patient is neurologically intact and CBC and basic metabolic panel are essentially unremarkable.  She does have a hemoglobin of 9.0, but this is consistent  with her baseline.  She takes iron for this and denies having had a menstrual period secondary to her implanted contraceptive.  At this point, I suspect a peripheral vertigo.  Do not feel as though imaging is indicated at this time.  Patient will be discharged with meclizine and rest.  If she is not improving in the next week, she is to follow-up with her primary doctor and is to return as needed if symptoms worsen or change.  Final Clinical Impression(s) / ED Diagnoses Final diagnoses:  None    Rx / DC Orders ED Discharge Orders     None        Veryl Speak, MD 12/03/20 [phone]

## 2020-12-03 NOTE — Discharge Instructions (Signed)
Begin taking meclizine as prescribed as needed for dizziness.  Rest.  Follow-up with primary doctor if symptoms are not improving in the next week, and return to the ER if symptoms worsen or change.

## 2021-02-03 ENCOUNTER — Emergency Department (HOSPITAL_COMMUNITY): Payer: BC Managed Care – PPO

## 2021-02-03 ENCOUNTER — Other Ambulatory Visit: Payer: Self-pay

## 2021-02-03 ENCOUNTER — Emergency Department (HOSPITAL_COMMUNITY)
Admission: EM | Admit: 2021-02-03 | Discharge: 2021-02-03 | Disposition: A | Discharge status: Home / Self Care | Payer: BC Managed Care – PPO | Attending: Emergency Medicine | Admitting: Emergency Medicine

## 2021-02-03 ENCOUNTER — Encounter (HOSPITAL_COMMUNITY): Payer: Self-pay | Admitting: Emergency Medicine

## 2021-02-03 DIAGNOSIS — I10 Essential (primary) hypertension: Secondary | ICD-10-CM | POA: Insufficient documentation

## 2021-02-03 DIAGNOSIS — Z79899 Other long term (current) drug therapy: Secondary | ICD-10-CM | POA: Insufficient documentation

## 2021-02-03 DIAGNOSIS — R079 Chest pain, unspecified: Secondary | ICD-10-CM | POA: Diagnosis not present

## 2021-02-03 DIAGNOSIS — M542 Cervicalgia: Secondary | ICD-10-CM | POA: Diagnosis present

## 2021-02-03 DIAGNOSIS — R109 Unspecified abdominal pain: Secondary | ICD-10-CM | POA: Insufficient documentation

## 2021-02-03 DIAGNOSIS — T1490XA Injury, unspecified, initial encounter: Secondary | ICD-10-CM

## 2021-02-03 DIAGNOSIS — S0990XA Unspecified injury of head, initial encounter: Secondary | ICD-10-CM | POA: Insufficient documentation

## 2021-02-03 DIAGNOSIS — S161XXA Strain of muscle, fascia and tendon at neck level, initial encounter: Secondary | ICD-10-CM | POA: Diagnosis not present

## 2021-02-03 LAB — I-STAT CHEM 8, ED
BUN: 13 mg/dL (ref 6–20)
Calcium, Ion: 1.16 mmol/L (ref 1.15–1.40)
Chloride: 107 mmol/L (ref 98–111)
Creatinine, Ser: 0.8 mg/dL (ref 0.44–1.00)
Glucose, Bld: 94 mg/dL (ref 70–99)
HCT: 29 % — ABNORMAL LOW (ref 36.0–46.0)
Hemoglobin: 9.9 g/dL — ABNORMAL LOW (ref 12.0–15.0)
Potassium: 3.9 mmol/L (ref 3.5–5.1)
Sodium: 140 mmol/L (ref 135–145)
TCO2: 23 mmol/L (ref 22–32)

## 2021-02-03 LAB — CBC
HCT: 30.8 % — ABNORMAL LOW (ref 36.0–46.0)
Hemoglobin: 9.1 g/dL — ABNORMAL LOW (ref 12.0–15.0)
MCH: 22.9 pg — ABNORMAL LOW (ref 26.0–34.0)
MCHC: 29.5 g/dL — ABNORMAL LOW (ref 30.0–36.0)
MCV: 77.6 fL — ABNORMAL LOW (ref 80.0–100.0)
Platelets: 533 10*3/uL — ABNORMAL HIGH (ref 150–400)
RBC: 3.97 MIL/uL (ref 3.87–5.11)
RDW: 17.1 % — ABNORMAL HIGH (ref 11.5–15.5)
WBC: 6.6 10*3/uL (ref 4.0–10.5)
nRBC: 0 % (ref 0.0–0.2)

## 2021-02-03 LAB — COMPREHENSIVE METABOLIC PANEL
ALT: 22 U/L (ref 0–44)
AST: 17 U/L (ref 15–41)
Albumin: 3.7 g/dL (ref 3.5–5.0)
Alkaline Phosphatase: 73 U/L (ref 38–126)
Anion gap: 8 (ref 5–15)
BUN: 13 mg/dL (ref 6–20)
CO2: 23 mmol/L (ref 22–32)
Calcium: 9.3 mg/dL (ref 8.9–10.3)
Chloride: 108 mmol/L (ref 98–111)
Creatinine, Ser: 0.78 mg/dL (ref 0.44–1.00)
GFR, Estimated: >60 mL/min (ref >60)
Glucose, Bld: 98 mg/dL (ref 70–99)
Potassium: 4 mmol/L (ref 3.5–5.1)
Sodium: 139 mmol/L (ref 135–145)
Total Bilirubin: 0.3 mg/dL (ref 0.3–1.2)
Total Protein: 7.3 g/dL (ref 6.5–8.1)

## 2021-02-03 LAB — I-STAT BETA HCG BLOOD, ED (MC, WL, AP ONLY): I-stat hCG, quantitative: <5 m[IU]/mL (ref <5)

## 2021-02-03 LAB — SAMPLE TO BLOOD BANK

## 2021-02-03 MED ORDER — ONDANSETRON HCL 4 MG/2ML IJ SOLN
4.0000 mg | Freq: Once | INTRAMUSCULAR | Status: AC
  Administered 2021-02-03: 4 mg via INTRAVENOUS
  Filled 2021-02-03: qty 2

## 2021-02-03 MED ORDER — CYCLOBENZAPRINE HCL 10 MG PO TABS: 10.0000 mg | ORAL_TABLET | Freq: Two times a day (BID) | ORAL | 0 refills | Status: DC | PRN

## 2021-02-03 MED ORDER — MORPHINE SULFATE (PF) 4 MG/ML IV SOLN
4.0000 mg | Freq: Once | INTRAVENOUS | Status: AC
  Administered 2021-02-03: 4 mg via INTRAVENOUS
  Filled 2021-02-03: qty 1

## 2021-02-03 MED ORDER — ETODOLAC 300 MG PO CAPS: 300.0000 mg | ORAL_CAPSULE | Freq: Three times a day (TID) | ORAL | 0 refills | Status: AC

## 2021-02-03 MED ORDER — IOHEXOL 350 MG/ML SOLN
100.0000 mL | Freq: Once | INTRAVENOUS | Status: AC | PRN
  Administered 2021-02-03: 100 mL via INTRAVENOUS

## 2021-02-03 NOTE — ED Notes (Signed)
Patient transported to CT 
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
## 2021-02-03 NOTE — ED Notes (Signed)
Placed C collar on pt's neck
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
## 2021-02-23 LAB — HM PAP SMEAR

## 2021-03-14 ENCOUNTER — Encounter: Payer: Self-pay | Admitting: Emergency Medicine

## 2021-03-14 ENCOUNTER — Other Ambulatory Visit: Payer: Self-pay

## 2021-03-14 ENCOUNTER — Ambulatory Visit
Admission: EM | Admit: 2021-03-14 | Discharge: 2021-03-14 | Disposition: A | Discharge status: Home / Self Care | Payer: BC Managed Care – PPO | Attending: Internal Medicine | Admitting: Internal Medicine

## 2021-03-14 DIAGNOSIS — J069 Acute upper respiratory infection, unspecified: Secondary | ICD-10-CM | POA: Diagnosis not present

## 2021-03-14 DIAGNOSIS — J029 Acute pharyngitis, unspecified: Secondary | ICD-10-CM | POA: Diagnosis not present

## 2021-03-14 LAB — POCT RAPID STREP A (OFFICE): Rapid Strep A Screen: NEGATIVE

## 2021-03-14 MED ORDER — BENZONATATE 100 MG PO CAPS: 100.0000 mg | ORAL_CAPSULE | Freq: Three times a day (TID) | ORAL | 0 refills | Status: DC | PRN

## 2021-03-14 NOTE — Discharge Instructions (Addendum)
Rapid strep test was negative.  Throat culture and COVID-19 and flu test are pending.  We will call if they are positive.  You have prescribed medications to help alleviate symptoms.  It appears that you have a viral upper respiratory infection that should resolve in the next few days with symptomatic treatment.

## 2021-03-14 NOTE — ED Triage Notes (Signed)
Reports cough, runny nose, nausea, diarrhea, sore throat, bodyaches and headache developing over the last couple of days.

## 2021-03-14 NOTE — ED Provider Notes (Signed)
[facility]    CSN: [id number] Arrival date & time: 03/14/21  0911      History   Chief Complaint Chief Complaint  Patient presents with   Influenza    HPI Joan Simpson is a 30 y.o. female.   Patient presents with nonproductive cough, runny nose, nausea, diarrhea, sore throat, body aches that started approximately 3 to 4 days ago.  Patient denies any known fevers or sick contacts.  Denies chest pain, shortness of breath.  Patient has taken Tylenol for symptoms.   Influenza  Past Medical History:  Diagnosis Date   Allergy    Anxiety    Bipolar 1 disorder ([place])    Depression    Hypertension     There are no problems to display for this patient.   Past Surgical History:  Procedure Laterality Date   NO PAST SURGERIES      OB History     Gravida  1   Para      Term      Preterm      AB      Living  0      SAB      IAB      Ectopic      Multiple      Live Births               Home Medications    Prior to Admission medications   Medication Sig Start Date End Date Taking? Authorizing Provider  benzonatate (TESSALON) 100 MG capsule Take 1 capsule (100 mg total) by mouth every 8 (eight) hours as needed for cough. 03/14/21  Yes , Michele Rockers, FNP  cyclobenzaprine (FLEXERIL) 10 MG tablet Take 1 tablet (10 mg total) by mouth 2 (two) times daily as needed for muscle spasms. 02/03/21   Dorie Rank, MD  lamoTRIgine (LAMICTAL) 200 MG tablet Take 200 mg by mouth daily. 01/13/20   [provider]  meclizine (ANTIVERT) 25 MG tablet Take 1 tablet (25 mg total) by mouth 3 (three) times daily as needed for dizziness. 12/03/20   Veryl Speak, MD  modafinil (PROVIGIL) 200 MG tablet Take 200 mg by mouth daily.    [provider]  naproxen sodium (ALEVE) 220 MG tablet Take 1 tablet (220 mg total) by mouth 2 (two) times daily as needed (headache, pain). Patient not taking: Reported on 02/09/2020 07/10/18   Little, Wenda Overland, MD  ondansetron (ZOFRAN ODT) 4 MG disintegrating tablet Take 1 tablet (4 mg total) by mouth every 8 (eight) hours as needed for nausea or vomiting. 02/09/20   Hayden Rasmussen, MD  ondansetron (ZOFRAN) 4 MG tablet Take 1 tablet (4 mg total) by mouth every 6 (six) hours. Patient not taking: Reported on 02/09/2020 05/20/19   Blanchie Dessert, MD  promethazine-dextromethorphan (PROMETHAZINE-DM) 6.25-15 MG/5ML syrup Take 5 mLs by mouth 4 (four) times daily as needed for cough. Patient not taking: Reported on 02/09/2020 07/10/18   Little, Wenda Overland, MD  QUEtiapine (SEROQUEL) 300 MG tablet Take 300 mg by mouth at bedtime. 01/07/20   [provider]  albuterol (PROVENTIL HFA;VENTOLIN HFA) 108 (90 Base) MCG/ACT inhaler Inhale 1-2 puffs into the lungs every 6 (six) hours as needed for wheezing or shortness of breath. Patient not taking: Reported on 07/10/2018 01/01/17 11/01/18  Marney Setting, NP  fluticasone [facility]) 50 MCG/ACT nasal spray Place 2 sprays into both nostrils daily. Patient not taking: Reported on 07/10/2018 01/16/17 11/01/18  Joretta Bachelor, PA  Family History Family History  Problem Relation Age of Onset   Hypertension Mother     Social History Social History   Tobacco Use   Smoking status: Never   Smokeless tobacco: Never  Substance Use Topics   Alcohol use: Yes   Drug use: No     Allergies   Erythromycin   Review of Systems Review of Systems Per HPI  Physical Exam Triage Vital Signs ED Triage Vitals  Enc Vitals Group     BP 03/14/21 1115 111/66     Pulse Rate 03/14/21 1115 (!) 118     Resp 03/14/21 1115 16     Temp 03/14/21 1115 98.8 F (37.1 C)     Temp Source 03/14/21 1115 Oral     SpO2 03/14/21 1115 99 %     Weight --      Height --      Head Circumference --      Peak Flow --      Pain Score 03/14/21 1116 8     Pain Loc --      Pain Edu? --      Excl. in [place]? --    No data found.  Updated Vital Signs BP 111/66 (BP  Location: Right Arm)   Pulse (!) 118   Temp 98.8 F (37.1 C) (Oral)   Resp 16   SpO2 99%   Visual Acuity Right Eye Distance:   Left Eye Distance:   Bilateral Distance:    Right Eye Near:   Left Eye Near:    Bilateral Near:     Physical Exam Constitutional:      General: She is not in acute distress.    Appearance: Normal appearance. She is not toxic-appearing or diaphoretic.  HENT:     Head: Normocephalic and atraumatic.     Right Ear: Tympanic membrane and ear canal normal.     Left Ear: Tympanic membrane and ear canal normal.     Nose: Congestion present.     Mouth/Throat:     Mouth: Mucous membranes are moist.     Pharynx: Posterior oropharyngeal erythema present.  Eyes:     Extraocular Movements: Extraocular movements intact.     Conjunctiva/sclera: Conjunctivae normal.     Pupils: Pupils are equal, round, and reactive to light.  Cardiovascular:     Rate and Rhythm: Normal rate and regular rhythm.     Pulses: Normal pulses.     Heart sounds: Normal heart sounds.  Pulmonary:     Effort: Pulmonary effort is normal. No respiratory distress.     Breath sounds: Normal breath sounds. No stridor. No wheezing, rhonchi or rales.  Abdominal:     General: Abdomen is flat. Bowel sounds are normal.     Palpations: Abdomen is soft.  Musculoskeletal:        General: Normal range of motion.     Cervical back: Normal range of motion.  Skin:    General: Skin is warm and dry.  Neurological:     General: No focal deficit present.     Mental Status: She is alert and oriented to person, place, and time. Mental status is at baseline.  Psychiatric:        Mood and Affect: Mood normal.        Behavior: Behavior normal.     UC Treatments / Results  Labs (all labs ordered are listed, but only abnormal results are displayed) Labs Reviewed  CULTURE, GROUP A STREP ([place])  COVID-19, FLU  A+B NAA  POCT RAPID STREP A (OFFICE)    EKG   Radiology No results  found.  Procedures Procedures (including critical care time)  Medications Ordered in UC Medications - No data to display  Initial Impression / Assessment and Plan / UC Course  I have reviewed the triage vital signs and the nursing notes.  Pertinent labs & imaging results that were available during my care of the patient were reviewed by me and considered in my medical decision making (see chart for details).     Patient presents with symptoms likely from a viral upper respiratory infection. Differential includes bacterial pneumonia, sinusitis, allergic rhinitis, Covid 19, flu. Do not suspect underlying cardiopulmonary process. Symptoms seem unlikely related to ACS, CHF or COPD exacerbations, pneumonia, pneumothorax. Patient is nontoxic appearing and not in need of emergent medical intervention.  Rapid strep test was negative.  COVID-19 and flu test are pending.  Recommended symptom control with over the counter medications: Daily oral anti-histamine, Oral decongestant or IN corticosteroid, saline irrigations, cepacol lozenges, Robitussin, Delsym, honey tea.  Benzonatate prescribed for patient to take as needed for cough.  Return if symptoms fail to improve in 1-2 weeks or you develop shortness of breath, chest pain, severe headache. Patient states understanding and is agreeable.  Discharged with PCP followup.  Final Clinical Impressions(s) / UC Diagnoses   Final diagnoses:  Viral upper respiratory tract infection with cough  Sore throat     Discharge Instructions      Rapid strep test was negative.  Throat culture and COVID-19 and flu test are pending.  We will call if they are positive.  You have prescribed medications to help alleviate symptoms.  It appears that you have a viral upper respiratory infection that should resolve in the next few days with symptomatic treatment.    ED Prescriptions     Medication Sig Dispense Auth. Provider   benzonatate (TESSALON) 100 MG capsule  Take 1 capsule (100 mg total) by mouth every 8 (eight) hours as needed for cough. 21 capsule [place], Michele Rockers, [place]      PDMP not reviewed this encounter.   Teodora Medici, [place] 03/14/21 1318

## 2021-03-15 LAB — COVID-19, FLU A+B NAA
Influenza A, NAA: NOT DETECTED
Influenza B, NAA: NOT DETECTED
SARS-CoV-2, NAA: NOT DETECTED

## 2021-03-17 LAB — CULTURE, GROUP A STREP (THRC)

## 2021-04-29 ENCOUNTER — Telehealth: Payer: Self-pay | Admitting: Physician Assistant

## 2021-04-29 NOTE — Telephone Encounter (Signed)
Scheduled appt per 1/10 referral. Pt is aware of appt dat eand time. Pt is aware to arrive 15 mins prior to appt time.  °

## 2021-05-19 NOTE — Progress Notes (Signed)
[place] [phone]   [phone]  INITIAL CONSULT NOTE  Patient Care Team: Patient, No Pcp Per (Inactive) as PCP - General (General Practice)  CHIEF COMPLAINTS/PURPOSE OF CONSULTATION:  Thrombocytosis with microcytic anemia.   HISTORY OF PRESENTING ILLNESS:  Joan Simpson 31 y.o. female with medical history significant for anxiety, depression, bipolar disorder and hypertension. She presents today for initial evaluation for thrombocytosis and microcytic anemia. She is unaccompanied for this visit. Prior records revealed that Joan Simpson underwent laboratory evaluation with her OB/GYN team on 02/23/2021. Findings revealed Hgb 9.6, MCV 72.5 and platelet 576K.   On exam today, Joan Simpson reports persistent fatigue that has been present for several years. She is able to complete her daily activities on her own but requires frequent resting. She has history of chronic episodes of dizziness that has been more frequent in the last several months. Her last syncopal episode was six months ago. She has a balanced diet without any dietary restrictions. She eats red meat regularly. She has intermittent episodes of nausea and vomiting without any known triggers. She denies abdominal pain and has regular bowel movements. She denies easy bruising or signs of bleeding including hematochezia, melena, hematuria, hemoptysis, gingival bleeding and epistaxis. She has been on birth control for several years and reports 1-2 menstrual cycles per year with very light bleeding. She has intermittent episodes of swelling and numbness in her hands that affect her grip. She denies fevers, chills, night sweats, shortness of breath, chest pain or cough. She has no other complaints. Rest of the 10 point ROS is below.   MEDICAL HISTORY:  Past Medical History:  Diagnosis Date   Allergy    Anxiety    Bipolar 1 disorder ([place])    Depression    Hypertension     SURGICAL HISTORY: Past  Surgical History:  Procedure Laterality Date   NO PAST SURGERIES      SOCIAL HISTORY: Social History   Socioeconomic History   Marital status: Single    Spouse name: Not on file   Number of children: Not on file   Years of education: Not on file   Highest education level: Not on file  Occupational History   Not on file  Tobacco Use   Smoking status: Never   Smokeless tobacco: Never  Vaping Use   Vaping Use: Not on file  Substance and Sexual Activity   Alcohol use: Yes    Comment: once-twice a year   Drug use: No   Sexual activity: Yes    Birth control/protection: Implant  Other Topics Concern   Not on file  Social History Narrative   Not on file   Social Determinants of Health   Financial Resource Strain: Not on file  Food Insecurity: Not on file  Transportation Needs: Not on file  Physical Activity: Not on file  Stress: Not on file  Social Connections: Not on file  Intimate Partner Violence: Not on file    FAMILY HISTORY: Family History  Problem Relation Age of Onset   Hypertension Mother     ALLERGIES:  is allergic to erythromycin.  MEDICATIONS:  Current Outpatient Medications  Medication Sig Dispense Refill   benzonatate (TESSALON) 100 MG capsule Take 1 capsule (100 mg total) by mouth every 8 (eight) hours as needed for cough. 21 capsule 0   cyclobenzaprine (FLEXERIL) 10 MG tablet Take 1 tablet (10 mg total) by mouth 2 (two) times daily as needed for muscle spasms. 20 tablet 0  lamoTRIgine (LAMICTAL) 200 MG tablet Take 200 mg by mouth daily.     meclizine (ANTIVERT) 25 MG tablet Take 1 tablet (25 mg total) by mouth 3 (three) times daily as needed for dizziness. 15 tablet 0   modafinil (PROVIGIL) 200 MG tablet Take 200 mg by mouth daily.     naproxen sodium (ALEVE) 220 MG tablet Take 1 tablet (220 mg total) by mouth 2 (two) times daily as needed (headache, pain). 8 tablet 0   ondansetron (ZOFRAN ODT) 4 MG disintegrating tablet Take 1 tablet (4 mg total)  by mouth every 8 (eight) hours as needed for nausea or vomiting. 20 tablet 0   ondansetron (ZOFRAN) 4 MG tablet Take 1 tablet (4 mg total) by mouth every 6 (six) hours. 12 tablet 0   promethazine-dextromethorphan (PROMETHAZINE-DM) 6.25-15 MG/5ML syrup Take 5 mLs by mouth 4 (four) times daily as needed for cough. 118 mL 0   QUEtiapine (SEROQUEL) 300 MG tablet Take 300 mg by mouth at bedtime.     No current facility-administered medications for this visit.    REVIEW OF SYSTEMS:   Constitutional: ( - ) fevers, ( - )  chills , ( - ) night sweats Eyes: ( - ) blurriness of vision, ( - ) double vision, ( - ) watery eyes Ears, nose, mouth, throat, and face: ( - ) mucositis, ( - ) sore throat Respiratory: ( - ) cough, ( - ) dyspnea, ( - ) wheezes Cardiovascular: ( - ) palpitation, ( - ) chest discomfort, ( - ) lower extremity swelling Gastrointestinal:  ( +) nausea, ( - ) heartburn, ( - ) change in bowel habits Skin: ( - ) abnormal skin rashes Lymphatics: ( - ) new lymphadenopathy, ( - ) easy bruising Neurological: ( +) numbness, ( - ) tingling, ( - ) new weaknesses Behavioral/Psych: ( - ) mood change, ( - ) new changes  All other systems were reviewed with the patient and are negative.  PHYSICAL EXAMINATION: ECOG PERFORMANCE STATUS: 1 - Symptomatic but completely ambulatory  Vitals:   05/20/21 0848  BP: 116/73  Pulse: 100  Resp: 20  Temp: 97.7 F (36.5 C)  SpO2: 99%   Filed Weights   05/20/21 0848  Weight: 163 lb 9.6 oz (74.2 kg)    GENERAL: well appearing female in NAD  SKIN: skin color, texture, turgor are normal, no rashes or significant lesions EYES: conjunctiva are pink and non-injected, sclera clear OROPHARYNX: no exudate, no erythema; lips, buccal mucosa, and tongue normal  NECK: supple, non-tender LYMPH:  no palpable lymphadenopathy in the cervical or supraclavicular lymph nodes.  LUNGS: clear to auscultation and percussion with normal breathing effort HEART: regular rate  & rhythm and no murmurs and no lower extremity edema ABDOMEN: soft, non-tender, non-distended, normal bowel sounds Musculoskeletal: no cyanosis of digits and no clubbing  PSYCH: alert & oriented x 3, fluent speech NEURO: no focal motor/sensory deficits  LABORATORY DATA:  I have reviewed the data as listed CBC Latest Ref Rng & Units 02/03/2021 02/03/2021 12/02/2020  WBC 4.0 - 10.5 K/uL - 6.6 11.6(H)  Hemoglobin 12.0 - 15.0 g/dL 9.9(L) 9.1(L) 9.0(L)  Hematocrit 36.0 - 46.0 % 29.0(L) 30.8(L) 29.7(L)  Platelets 150 - 400 K/uL - 533(H) 531(H)    CMP Latest Ref Rng & Units 02/03/2021 02/03/2021 12/02/2020  Glucose 70 - 99 mg/dL 94 98 88  BUN 6 - 20 mg/dL 13 13 12   Creatinine 0.44 - 1.00 mg/dL 0.80 0.78 0.75  Sodium 135 - 145  mmol/L 140 139 138  Potassium 3.5 - 5.1 mmol/L 3.9 4.0 3.9  Chloride 98 - 111 mmol/L 107 108 107  CO2 22 - 32 mmol/L - 23 24  Calcium 8.9 - 10.3 mg/dL - 9.3 9.2  Total Protein 6.5 - 8.1 g/dL - 7.3 -  Total Bilirubin 0.3 - 1.2 mg/dL - 0.3 -  Alkaline Phos 38 - 126 U/L - 73 -  AST 15 - 41 U/L - 17 -  ALT 0 - 44 U/L - 22 -    ASSESSMENT & PLAN Talena Neira is a 31 y.o. female who presents to the clinic for initial evaluation for thrombocytosis and microcytic anemia. We reviewed possible etiologies including iron deficiency anemia, inflammatory process and bone marrow disorders. She will proceed with serologic workup today to check CBC, CMP, iron and TIBC, ferritin, retic panel, sedimentation rate and c-reactive protein.   Patient is currently on over the counter iron pills that she takes once a day. If there is persistent iron deficiency anemia, we will arrange IV iron infusions. Additionally, we will make a referral to gastroenterology to evaluate for malabsorption versus GI bleeding since patient has very infrequent menstrual cycles.   #Microcytic anemia with thrombocytosis: --Suspect iron deficiency anemia, currently on OTC iron tablets once a day. Recommend to  continue and take with a source of vitamin C.  --Labs today to check CBC, CMP, iron and TIBC, ferritin, retic panel, sedimentation rate and c-reactive protein.  --If above workup confirms iron deficiency anemia, we will arrange IV iron infusions at [facility] and make referral to gastroenterology --RTC in 8 weeks with repeat labs.   Orders Placed This Encounter  Procedures   CBC with Differential ([place] Only)    Standing Status:   Future    Number of Occurrences:   1    Standing Expiration Date:   05/19/2022   CMP ([place] only)    Standing Status:   Future    Number of Occurrences:   1    Standing Expiration Date:   05/19/2022   Ferritin    Standing Status:   Future    Number of Occurrences:   1    Standing Expiration Date:   05/19/2022   Iron and Iron Binding Capacity ([facility] only)    Standing Status:   Future    Number of Occurrences:   1    Standing Expiration Date:   05/19/2022   Retic Panel    Standing Status:   Future    Number of Occurrences:   1    Standing Expiration Date:   05/19/2022   Sedimentation rate    Standing Status:   Future    Number of Occurrences:   1    Standing Expiration Date:   05/19/2022   C-reactive protein    Standing Status:   Future    Number of Occurrences:   1    Standing Expiration Date:   05/19/2022    All questions were answered. The patient knows to call the clinic with any problems, questions or concerns.  I have spent a total of 60 minutes minutes of face-to-face and non-face-to-face time, preparing to see the patient, obtaining and/or reviewing separately obtained history, performing a medically appropriate examination, counseling and educating the patient, ordering medications/tests, referring and communicating with other health care professionals, documenting clinical information in the electronic health record,  and care coordination.   Dede Query, PA-C Department of Hematology/Oncology [facility]  at  [facility] Phone: [phone]  Patient was seen with Dr. Lorenso Courier  I have read the above note and personally examined the patient. I agree with the assessment and plan as noted above.  Briefly Ms. Korene Dula is a 31 year old female who presents for evaluation of microcytic anemia and thrombocytosis, thought to be secondary to iron deficiency anemia due to heavy menstrual cycles.  The patient does take p.o. iron therapy but does not appear to be adequately improving her iron stores or decreasing her thrombocytosis.  Today we will order repeat iron studies and CBC.  In the event the patient is indeed found to be iron deficient we would recommend pursuing IV iron therapy.  If the patient is not iron deficient we would need to consider MPN work-up given her thrombocytosis.  The patient voiced understanding of this plan moving forward.  We will plan to see her back approximate 4 to 6 weeks after last dose IV iron.   Ledell Peoples, MD Department of Hematology/Oncology [place] at [facility] Phone: [phone] Pager: [phone] Email: [email or web address]

## 2021-05-20 ENCOUNTER — Telehealth: Payer: Self-pay | Admitting: Pharmacy Technician

## 2021-05-20 ENCOUNTER — Encounter: Payer: Self-pay | Admitting: Physician Assistant

## 2021-05-20 ENCOUNTER — Other Ambulatory Visit: Payer: Self-pay

## 2021-05-20 ENCOUNTER — Inpatient Hospital Stay: Payer: BC Managed Care – PPO | Attending: Physician Assistant | Admitting: Physician Assistant

## 2021-05-20 ENCOUNTER — Telehealth: Payer: Self-pay

## 2021-05-20 ENCOUNTER — Inpatient Hospital Stay: Payer: BC Managed Care – PPO

## 2021-05-20 VITALS — BP 116/73 | HR 100 | Temp 97.7°F | Resp 20 | Wt 163.6 lb

## 2021-05-20 DIAGNOSIS — D509 Iron deficiency anemia, unspecified: Secondary | ICD-10-CM

## 2021-05-20 DIAGNOSIS — I10 Essential (primary) hypertension: Secondary | ICD-10-CM | POA: Diagnosis not present

## 2021-05-20 DIAGNOSIS — F319 Bipolar disorder, unspecified: Secondary | ICD-10-CM

## 2021-05-20 DIAGNOSIS — D75839 Thrombocytosis, unspecified: Secondary | ICD-10-CM | POA: Diagnosis not present

## 2021-05-20 LAB — CBC WITH DIFFERENTIAL (CANCER CENTER ONLY)
Abs Immature Granulocytes: 0.03 10*3/uL (ref 0.00–0.07)
Basophils Absolute: 0 10*3/uL (ref 0.0–0.1)
Basophils Relative: 0 %
Eosinophils Absolute: 0 10*3/uL (ref 0.0–0.5)
Eosinophils Relative: 1 %
HCT: 28.4 % — ABNORMAL LOW (ref 36.0–46.0)
Hemoglobin: 8.8 g/dL — ABNORMAL LOW (ref 12.0–15.0)
Immature Granulocytes: 1 %
Lymphocytes Relative: 32 %
Lymphs Abs: 2 10*3/uL (ref 0.7–4.0)
MCH: 23.3 pg — ABNORMAL LOW (ref 26.0–34.0)
MCHC: 31 g/dL (ref 30.0–36.0)
MCV: 75.1 fL — ABNORMAL LOW (ref 80.0–100.0)
Monocytes Absolute: 0.4 10*3/uL (ref 0.1–1.0)
Monocytes Relative: 6 %
Neutro Abs: 3.8 10*3/uL (ref 1.7–7.7)
Neutrophils Relative %: 60 %
Platelet Count: 521 10*3/uL — ABNORMAL HIGH (ref 150–400)
RBC: 3.78 MIL/uL — ABNORMAL LOW (ref 3.87–5.11)
RDW: 17.5 % — ABNORMAL HIGH (ref 11.5–15.5)
WBC Count: 6.3 10*3/uL (ref 4.0–10.5)
nRBC: 0 % (ref 0.0–0.2)

## 2021-05-20 LAB — CMP (CANCER CENTER ONLY)
ALT: 17 U/L (ref 0–44)
AST: 15 U/L (ref 15–41)
Albumin: 4.3 g/dL (ref 3.5–5.0)
Alkaline Phosphatase: 65 U/L (ref 38–126)
Anion gap: 8 (ref 5–15)
BUN: 9 mg/dL (ref 6–20)
CO2: 25 mmol/L (ref 22–32)
Calcium: 9.5 mg/dL (ref 8.9–10.3)
Chloride: 107 mmol/L (ref 98–111)
Creatinine: 0.75 mg/dL (ref 0.44–1.00)
GFR, Estimated: >60 mL/min (ref >60)
Glucose, Bld: 88 mg/dL (ref 70–99)
Potassium: 3.8 mmol/L (ref 3.5–5.1)
Sodium: 140 mmol/L (ref 135–145)
Total Bilirubin: 0.3 mg/dL (ref 0.3–1.2)
Total Protein: 7.7 g/dL (ref 6.5–8.1)

## 2021-05-20 LAB — IRON AND IRON BINDING CAPACITY (CC-WL,HP ONLY)
Iron: 15 ug/dL — ABNORMAL LOW (ref 28–170)
Saturation Ratios: 3 % — ABNORMAL LOW (ref 10.4–31.8)
TIBC: 494 ug/dL — ABNORMAL HIGH (ref 250–450)
UIBC: 479 ug/dL — ABNORMAL HIGH (ref 148–442)

## 2021-05-20 LAB — RETIC PANEL
Immature Retic Fract: 28.8 % — ABNORMAL HIGH (ref 2.3–15.9)
RBC.: 3.74 MIL/uL — ABNORMAL LOW (ref 3.87–5.11)
Retic Count, Absolute: 51.2 10*3/uL (ref 19.0–186.0)
Retic Ct Pct: 1.4 % (ref 0.4–3.1)
Reticulocyte Hemoglobin: 23.1 pg — ABNORMAL LOW (ref >27.9)

## 2021-05-20 LAB — C-REACTIVE PROTEIN: CRP: 0.8 mg/dL (ref <1.0)

## 2021-05-20 LAB — FERRITIN: Ferritin: <4 ng/mL — ABNORMAL LOW (ref 11–307)

## 2021-05-20 LAB — SEDIMENTATION RATE: Sed Rate: 33 mm/hr — ABNORMAL HIGH (ref 0–22)

## 2021-05-20 NOTE — Telephone Encounter (Signed)
Dr. Charlies Silvers, Auth Submission: denied Payer: [employer]BS Medication & CPT/J Code(s) submitted: MONOFERRIC Route of submission (phone, fax, portal): PHONE Auth type: Buy/Bill Units/visits requested: 1 Reference number:   Denied due to patient has not tried and or failed step therapy. Venofer Infed Ferrlecit.  Would you like to try venofer??  Please re-enter therapy plan if agreeable and we will schedule the patient as soon as possible.  Joan Simpson

## 2021-05-20 NOTE — Telephone Encounter (Signed)
Pt advised with verbal understanding.  STAT referral faxed to [facility].  Confirmation rec'd

## 2021-05-20 NOTE — Telephone Encounter (Signed)
-----   Message from Lincoln Brigham, PA-C sent at 05/20/2021  1:03 PM EST ----- Please notify patient that labs confirm iron deficiency anemia. We will arrange IV iron infusion. Additionally, please fax referral to [facility] gastroenterology.

## 2021-05-20 NOTE — Telephone Encounter (Signed)
error 

## 2021-05-24 ENCOUNTER — Other Ambulatory Visit: Payer: Self-pay

## 2021-05-24 ENCOUNTER — Ambulatory Visit (INDEPENDENT_AMBULATORY_CARE_PROVIDER_SITE_OTHER): Payer: BC Managed Care – PPO

## 2021-05-24 VITALS — BP 102/67 | HR 86 | Temp 98.2°F | Resp 20 | Ht 61.0 in | Wt 163.4 lb

## 2021-05-24 DIAGNOSIS — D509 Iron deficiency anemia, unspecified: Secondary | ICD-10-CM

## 2021-05-24 MED ORDER — SODIUM CHLORIDE 0.9 % IV SOLN
200.0000 mg | Freq: Once | INTRAVENOUS | Status: AC
  Administered 2021-05-24: 200 mg via INTRAVENOUS
  Filled 2021-05-24: qty 10

## 2021-05-24 MED ORDER — EPINEPHRINE 0.3 MG/0.3ML IJ SOAJ: 0.3000 mg | Freq: Once | INTRAMUSCULAR | Status: DC | PRN

## 2021-05-24 MED ORDER — ALBUTEROL SULFATE HFA 108 (90 BASE) MCG/ACT IN AERS: 2.0000 | INHALATION_SPRAY | Freq: Once | RESPIRATORY_TRACT | Status: DC | PRN

## 2021-05-24 MED ORDER — DIPHENHYDRAMINE HCL 50 MG/ML IJ SOLN: 50.0000 mg | Freq: Once | INTRAMUSCULAR | Status: DC | PRN

## 2021-05-24 MED ORDER — METHYLPREDNISOLONE SODIUM SUCC 125 MG IJ SOLR: 125.0000 mg | Freq: Once | INTRAMUSCULAR | Status: DC | PRN

## 2021-05-24 MED ORDER — SODIUM CHLORIDE 0.9 % IV SOLN: Freq: Once | INTRAVENOUS | Status: DC | PRN

## 2021-05-24 MED ORDER — FAMOTIDINE IN NACL 20-0.9 MG/50ML-% IV SOLN: 20.0000 mg | Freq: Once | INTRAVENOUS | Status: DC | PRN

## 2021-05-24 NOTE — Progress Notes (Addendum)
Diagnosis: Iron Deficiency Anemia  Provider:  Marshell Garfinkel, MD  Procedure: Infusion  IV Type: Peripheral, IV Location: R Hand  Venofer (Iron Sucrose), Dose: 200 mg  Infusion Start Time: 1015am  Infusion Stop Time: 1030am  Post Infusion IV Care: Observation period completed and Peripheral IV Discontinued  Discharge: Condition: Good, Destination: Home . AVS provided to patient.   Performed by:  Koren Shiver, RN

## 2021-05-25 ENCOUNTER — Encounter: Payer: Self-pay | Admitting: Physician Assistant

## 2021-05-26 ENCOUNTER — Encounter: Payer: Self-pay | Admitting: Physician Assistant

## 2021-05-31 ENCOUNTER — Other Ambulatory Visit: Payer: Self-pay

## 2021-05-31 ENCOUNTER — Ambulatory Visit (INDEPENDENT_AMBULATORY_CARE_PROVIDER_SITE_OTHER): Payer: BC Managed Care – PPO

## 2021-05-31 VITALS — BP 109/75 | HR 87 | Temp 98.4°F | Resp 18 | Ht 61.0 in | Wt 168.0 lb

## 2021-05-31 DIAGNOSIS — D509 Iron deficiency anemia, unspecified: Secondary | ICD-10-CM

## 2021-05-31 MED ORDER — SODIUM CHLORIDE 0.9 % IV SOLN
200.0000 mg | Freq: Once | INTRAVENOUS | Status: AC
  Administered 2021-05-31: 200 mg via INTRAVENOUS
  Filled 2021-05-31: qty 10

## 2021-05-31 MED ORDER — DIPHENHYDRAMINE HCL 50 MG/ML IJ SOLN: 50.0000 mg | Freq: Once | INTRAMUSCULAR | Status: DC | PRN

## 2021-05-31 MED ORDER — FAMOTIDINE IN NACL 20-0.9 MG/50ML-% IV SOLN: 20.0000 mg | Freq: Once | INTRAVENOUS | Status: DC | PRN

## 2021-05-31 MED ORDER — SODIUM CHLORIDE 0.9 % IV SOLN: Freq: Once | INTRAVENOUS | Status: DC | PRN

## 2021-05-31 MED ORDER — ALBUTEROL SULFATE HFA 108 (90 BASE) MCG/ACT IN AERS: 2.0000 | INHALATION_SPRAY | Freq: Once | RESPIRATORY_TRACT | Status: DC | PRN

## 2021-05-31 MED ORDER — EPINEPHRINE 0.3 MG/0.3ML IJ SOAJ: 0.3000 mg | Freq: Once | INTRAMUSCULAR | Status: DC | PRN

## 2021-05-31 MED ORDER — METHYLPREDNISOLONE SODIUM SUCC 125 MG IJ SOLR: 125.0000 mg | Freq: Once | INTRAMUSCULAR | Status: DC | PRN

## 2021-05-31 NOTE — Progress Notes (Signed)
Diagnosis: Iron Deficiency Anemia  Provider:  Marshell Garfinkel, MD  Procedure: Infusion  IV Type: Peripheral, IV Location: R Hand  Venofer (Iron Sucrose), Dose: 200 mg  Infusion Start Time: 1027  Infusion Stop Time: 1046  Post Infusion IV Care: Peripheral IV Discontinued  Discharge: Condition: Good, Destination: Home . AVS provided to patient.   Performed by:  Cleophus Molt, RN

## 2021-06-06 NOTE — Progress Notes (Signed)
06/08/2021 Joan Simpson [id number] 08-29-1990   ASSESSMENT AND PLAN:   Iron deficiency anemia, unspecified iron deficiency anemia type I recommend upper gastrointestinal and colorectal evaluation with an EGD and colonoscopy. The rationale for this was discussed. Alternative methods of upper gastrointestinal and colorectal evaluations were discussed as were their inherent limitations. Risk of bowel prep, conscious sedation, and EGD and colonoscopy were discussed. Risks include but are not limited to dehydration, pain, bleeding, cardiopulmonary process, bowel perforation, or other possible adverse outcomes.. Treatment plan was discussed with patient, and agreed upon. If negative could be from PPI, MF only since Nov, versus dietary  Gastroesophageal reflux disease without esophagitis -     IgA; Future -     Tissue Transglutaminase Abs,IgG,IgA; Future -     H. pylori antibody, IgG; Future -     C-reactive protein; Future Will start the patient on a PPI, will schedule for EGD Lifestyle changes discussed, avoid NSAIDS, ETOH See lab and medication orders  Lower abdominal pain -     TSH; Future -     C-reactive protein; Future   Future Appointments  Date Time Provider [place]  06/14/2021 10:00 AM [facility] 1 [facility] None  06/21/2021 10:00 AM [facility] 2 [facility] None  07/22/2021  8:00 AM [facility] None  07/22/2021  8:30 AM Lincoln Brigham, PA-C [facility] None  08/03/2021  3:40 PM Janith Lima, MD [facility] None    Patient Care Team: Patient, No Pcp Per (Inactive) as PCP - General (General Practice)  HISTORY OF PRESENT ILLNESS: 31 y.o. female referred by Cordelia Poche, with a past medical history of anxiety, depression, bipolar disorder, hypertension and others listed below presents for evaluation of microcytic anemia.  Patient was seen by hematology 05/20/2021 for microcytic anemia referred by OB/GYN patient also found to have  thrombocytosis. Has history of fatigue for years, episodes of dizziness.  Syncopal episode 6 months ago.  She has been on birth control for several years and reports 1-2 menstrual cycles per year and very light bleeding.  Patient started on iron supplementation and referred here for further evaluation.  Per patient has had anemia since elementary school. Maternal GM with stomach ulcer, No GI malignancy, no autoimmune disease.  No gallbladder issues in her family.  She has been on Metformin since November.  She has GERD x 10 years since birth of her child, had a lot of vomiting/nausea when pregnant. She switches between prilosec and nexium- will take OTC 20 mg two to three times a day.  She has nocturnal GERD that will wake her up, can have regurg/vomiting with this. Will sit up, take tums and prop her head up with improvement. Will have nausea, occ vomiting.  Water can even cause GERD.  Denies dysphagia, melena.  Will drink 1-2 x a year, aleve 2-3 x a month, non smoker, denies drug use.  States she will have 1-2 days a month of AB pain for last several months, lower AB pain, no associated with BM, no worse with foods. Worse with touch, just has to lay down.  She has BM once a day, can have alternating hard stools and loose stools. Patient denies change in bowel habits, hematochezia.  Denies changes in appetite, unintentional weight loss.   External labs and notes reviewed this visit: CBC  05/20/2021 -patient appears to have had an anemia since 2012, 2021 baseline appears to be around 9-10 HGB 8.8 MCV 75.1 with microcytic anemia WBC 6.3 Platelets 521  Anemia panel 05/20/2021  Iron 15 Ferritin <4  Kidney function 05/20/2021  BUN 9 Cr 0.75  GFR >60  Potassium 3.8   LFTs 05/20/2021  AST 15 ALT 17 Alkphos 65 TBili 0.3 CRP and sed rate negative 05/20/2021.   CT AB and pelvis with 02/2018 No evidence of appendicitis. 4.5 cm benign-appearing left ovarian cyst, most likely physiologic in a  reproductive age female. Small hiatal hernia. Horseshoe kidney.  Current Medications:   Current Outpatient Medications (Endocrine & Metabolic):    metFORMIN (GLUCOPHAGE) 500 MG tablet, Take 500 mg by mouth 2 (two) times daily.    Current Outpatient Medications (Analgesics):    naproxen sodium (ALEVE) 220 MG tablet, Take 1 tablet (220 mg total) by mouth 2 (two) times daily as needed (headache, pain).   Current Outpatient Medications (Other):    Armodafinil 250 MG tablet, Take 250 mg by mouth daily.   lamoTRIgine (LAMICTAL) 200 MG tablet, Take 200 mg by mouth daily.   LATUDA 40 MG TABS tablet, Take 40 mg by mouth at bedtime.  Medical History:  Past Medical History:  Diagnosis Date   Allergy    Anemia    Anxiety    Bipolar 1 disorder ([place])    Depression    Hypertension    Allergies:  Allergies  Allergen Reactions   Erythromycin Anaphylaxis and Swelling    Says she throws up and can't breath Says she throws up and can't breath     Surgical History:  She  has a past surgical history that includes No past surgeries. Family History:  Her family history includes Diabetes in her paternal grandmother; Heart disease in her brother and mother; Hypertension in her father and mother; Other in her father. Social History:   reports that she has never smoked. She has never used smokeless tobacco. She reports current alcohol use. She reports that she does not use drugs.  REVIEW OF SYSTEMS  : All other systems reviewed and negative except where noted in the History of Present Illness.   PHYSICAL EXAM: BP 100/66 (BP Location: Left Arm, Patient Position: Sitting, Cuff Size: Normal)    Pulse (!) 104    Ht 5\' 1"  (1.549 m) Comment: height measured without shoes   Wt 163 lb (73.9 kg)    BMI 30.80 kg/m  General:   Pleasant, well developed female in no acute distress Head:  Normocephalic and atraumatic. Eyes: sclerae anicteric,conjunctive pink  Heart:  regular rate and rhythm Pulm: Clear  anteriorly; no wheezing Abdomen:  Soft, Obese AB, skin exam normal, Normal bowel sounds. mild tenderness in the lower abdomen. With guarding and Without rebound, without hepatomegaly. Extremities:  Without edema. Msk:  Symmetrical without gross deformities. Peripheral pulses intact.  Neurologic:  Alert and  oriented x4;  grossly normal neurologically. Skin:   Dry and intact without significant lesions or rashes. Psychiatric: Demonstrates good judgement and reason without abnormal affect or behaviors.   Vladimir Crofts, PA-C 2:29 PM

## 2021-06-07 ENCOUNTER — Ambulatory Visit (INDEPENDENT_AMBULATORY_CARE_PROVIDER_SITE_OTHER): Payer: BC Managed Care – PPO

## 2021-06-07 ENCOUNTER — Other Ambulatory Visit: Payer: Self-pay

## 2021-06-07 VITALS — BP 95/61 | HR 98 | Temp 98.1°F | Resp 18 | Ht 61.0 in | Wt 164.0 lb

## 2021-06-07 DIAGNOSIS — D509 Iron deficiency anemia, unspecified: Secondary | ICD-10-CM

## 2021-06-07 MED ORDER — DIPHENHYDRAMINE HCL 50 MG/ML IJ SOLN: 50.0000 mg | Freq: Once | INTRAMUSCULAR | Status: DC | PRN

## 2021-06-07 MED ORDER — FAMOTIDINE IN NACL 20-0.9 MG/50ML-% IV SOLN: 20.0000 mg | Freq: Once | INTRAVENOUS | Status: DC | PRN

## 2021-06-07 MED ORDER — EPINEPHRINE 0.3 MG/0.3ML IJ SOAJ: 0.3000 mg | Freq: Once | INTRAMUSCULAR | Status: DC | PRN

## 2021-06-07 MED ORDER — METHYLPREDNISOLONE SODIUM SUCC 125 MG IJ SOLR: 125.0000 mg | Freq: Once | INTRAMUSCULAR | Status: DC | PRN

## 2021-06-07 MED ORDER — ALBUTEROL SULFATE HFA 108 (90 BASE) MCG/ACT IN AERS: 2.0000 | INHALATION_SPRAY | Freq: Once | RESPIRATORY_TRACT | Status: DC | PRN

## 2021-06-07 MED ORDER — SODIUM CHLORIDE 0.9 % IV SOLN
200.0000 mg | Freq: Once | INTRAVENOUS | Status: AC
  Administered 2021-06-07: 200 mg via INTRAVENOUS
  Filled 2021-06-07: qty 10

## 2021-06-07 MED ORDER — SODIUM CHLORIDE 0.9 % IV SOLN: Freq: Once | INTRAVENOUS | Status: DC | PRN

## 2021-06-07 NOTE — Progress Notes (Signed)
Diagnosis: Iron Deficiency Anemia  Provider:  Marshell Garfinkel, MD  Procedure: Infusion  IV Type: Peripheral, IV Location: R Hand  Venofer (Iron Sucrose), Dose: 200 mg  Infusion Start Time: 10.05  Infusion Stop Time: 10.23  Post Infusion IV Care: Peripheral IV Discontinued  Discharge: Condition: Good, Destination: Home . AVS provided to patient.   Performed by:  Arnoldo Morale, RN

## 2021-06-08 ENCOUNTER — Ambulatory Visit (INDEPENDENT_AMBULATORY_CARE_PROVIDER_SITE_OTHER): Payer: BC Managed Care – PPO | Admitting: Physician Assistant

## 2021-06-08 ENCOUNTER — Encounter: Payer: Self-pay | Admitting: Physician Assistant

## 2021-06-08 VITALS — BP 100/66 | HR 104 | Ht 61.0 in | Wt 163.0 lb

## 2021-06-08 DIAGNOSIS — K219 Gastro-esophageal reflux disease without esophagitis: Secondary | ICD-10-CM | POA: Diagnosis not present

## 2021-06-08 DIAGNOSIS — R1030 Lower abdominal pain, unspecified: Secondary | ICD-10-CM | POA: Diagnosis not present

## 2021-06-08 DIAGNOSIS — D509 Iron deficiency anemia, unspecified: Secondary | ICD-10-CM

## 2021-06-08 MED ORDER — PLENVU 140 G PO SOLR
ORAL | 0 refills | Status: DC
Start: 2021-06-08 — End: 2021-06-23

## 2021-06-08 MED ORDER — PANTOPRAZOLE SODIUM 40 MG PO TBEC: 40.0000 mg | DELAYED_RELEASE_TABLET | Freq: Two times a day (BID) | ORAL | 1 refills | Status: DC

## 2021-06-08 NOTE — Patient Instructions (Addendum)
If you are age 30 or older, your body mass index should be between 23-30. Your Body mass index is 30.8 kg/m. If this is out of the aforementioned range listed, please consider follow up with your Primary Care Provider.  If you are age 41 or younger, your body mass index should be between 19-25. Your Body mass index is 30.8 kg/m. If this is out of the aformentioned range listed, please consider follow up with your Primary Care Provider.  Your provider has requested that you go to the basement level for lab work before leaving today. Press "B" on the elevator. The lab is located at the first door on the left as you exit the elevator.   You have been scheduled for an endoscopy and colonoscopy. Please follow the written instructions given to you at your visit today. Please pick up your prep supplies at the pharmacy within the next 1-3 days. If you use inhalers (even only as needed), please bring them with you on the day of your procedure.    The [facility] providers would like to encourage you to use [facility] to communicate with providers for non-urgent requests or questions.  Due to long hold times on the telephone, sending your provider a message by [facility] may be a faster and more efficient way to get a response.  Please allow 48 business hours for a response.  Please remember that this is for non-urgent requests.     ORAL DIABETIC MEDICATION INSTRUCTIONS (Metformin, Glipizide, Glimepiride, Farxiga, [place], [place], [place], [place], [place])   The day before your procedure:  Take your diabetic pill as you do normally  The day of your procedure:  Do not take your diabetic pill   We will check your blood sugar levels during the admission process and again in Recovery before discharging you home   Please take your proton pump inhibitor medication 30 minutes to 1 hour before meals- this makes it more effective.  Avoid spicy and acidic foods Avoid fatty foods Limit your intake of  coffee, tea, alcohol, and carbonated drinks Work to maintain a healthy weight Keep the head of the bed elevated at least 3 inches with blocks or a wedge pillow if you are having any nighttime symptoms Stay upright for 2 hours after eating Avoid meals and snacks three to four hours before bedtime  Gastroesophageal Reflux Disease, Adult Gastroesophageal reflux (GER) happens when acid from the stomach flows up into the tube that connects the mouth and the stomach (esophagus). Normally, food travels down the esophagus and stays in the stomach to be digested. However, when a person has GER, food and stomach acid sometimes move back up into the esophagus. If this becomes a more serious problem, the person may be diagnosed with a disease called gastroesophageal reflux disease (GERD). GERD occurs when the reflux: Happens often. Causes frequent or severe symptoms. Causes problems such as damage to the esophagus. When stomach acid comes in contact with the esophagus, the acid may cause inflammation in the esophagus. Over time, GERD may create small holes (ulcers) in the lining of the esophagus. What are the causes? This condition is caused by a problem with the muscle between the esophagus and the stomach (lower esophageal sphincter, or LES). Normally, the LES muscle closes after food passes through the esophagus to the stomach. When the LES is weakened or abnormal, it does not close properly, and that allows food and stomach acid to go back up into the esophagus. The LES can be weakened  by certain dietary substances, medicines, and medical conditions, including: Tobacco use. Pregnancy. Having a hiatal hernia. Alcohol use. Certain foods and beverages, such as coffee, chocolate, onions, and peppermint. What increases the risk? You are more likely to develop this condition if you: Have an increased body weight. Have a connective tissue disorder. Take NSAIDs, such as ibuprofen. What are the signs or  symptoms? Symptoms of this condition include: Heartburn. Difficult or painful swallowing and the feeling of having a lump in the throat. A bitter taste in the mouth. Bad breath and having a large amount of saliva. Having an upset or bloated stomach and belching. Chest pain. Different conditions can cause chest pain. Make sure you see your health care provider if you experience chest pain. Shortness of breath or wheezing. Ongoing (chronic) cough or a nighttime cough. Wearing away of tooth enamel. Weight loss. How is this diagnosed? This condition may be diagnosed based on a medical history and a physical exam. To determine if you have mild or severe GERD, your health care provider may also monitor how you respond to treatment. You may also have tests, including: A test to examine your stomach and esophagus with a small camera (endoscopy). A test that measures the acidity level in your esophagus. A test that measures how much pressure is on your esophagus. A barium swallow or modified barium swallow test to show the shape, size, and functioning of your esophagus. How is this treated? Treatment for this condition may vary depending on how severe your symptoms are. Your health care provider may recommend: Changes to your diet. Medicine. Surgery. The goal of treatment is to help relieve your symptoms and to prevent complications. Follow these instructions at home: Eating and drinking  Follow a diet as recommended by your health care provider. This may involve avoiding foods and drinks such as: Coffee and tea, with or without caffeine. Drinks that contain alcohol. Energy drinks and sports drinks. Carbonated drinks or sodas. Chocolate and cocoa. Peppermint and mint flavorings. Garlic and onions. Horseradish. Spicy and acidic foods, including peppers, chili powder, curry powder, vinegar, hot sauces, and barbecue sauce. Citrus fruit juices and citrus fruits, such as oranges, lemons, and  limes. Tomato-based foods, such as red sauce, chili, salsa, and pizza with red sauce. Fried and fatty foods, such as donuts, french fries, potato chips, and high-fat dressings. High-fat meats, such as hot dogs and fatty cuts of red and white meats, such as rib eye steak, sausage, ham, and bacon. High-fat dairy items, such as whole milk, butter, and cream cheese. Eat small, frequent meals instead of large meals. Avoid drinking large amounts of liquid with your meals. Avoid eating meals during the 2-3 hours before bedtime. Avoid lying down right after you eat. Do not exercise right after you eat. Lifestyle  Do not use any products that contain nicotine or tobacco. These products include cigarettes, chewing tobacco, and vaping devices, such as e-cigarettes. If you need help quitting, ask your health care provider. Try to reduce your stress by using methods such as yoga or meditation. If you need help reducing stress, ask your health care provider. If you are overweight, reduce your weight to an amount that is healthy for you. Ask your health care provider for guidance about a safe weight loss goal. General instructions Pay attention to any changes in your symptoms. Take over-the-counter and prescription medicines only as told by your health care provider. Do not take aspirin, ibuprofen, or other NSAIDs unless your health care provider told  you to take these medicines. Wear loose-fitting clothing. Do not wear anything tight around your waist that causes pressure on your abdomen. Raise (elevate) the head of your bed about 6 inches (15 cm). You can use a wedge to do this. Avoid bending over if this makes your symptoms worse. Keep all follow-up visits. This is important. Contact a health care provider if: You have: New symptoms. Unexplained weight loss. Difficulty swallowing or it hurts to swallow. Wheezing or a persistent cough. A hoarse voice. Your symptoms do not improve with treatment. Get  help right away if: You have sudden pain in your arms, neck, jaw, teeth, or back. You suddenly feel sweaty, dizzy, or light-headed. You have chest pain or shortness of breath. You vomit and the vomit is green, yellow, or black, or it looks like blood or coffee grounds. You faint. You have stool that is red, bloody, or black. You cannot swallow, drink, or eat. These symptoms may represent a serious problem that is an emergency. Do not wait to see if the symptoms will go away. Get medical help right away. Call your local emergency services (911 in the [place] Do not drive yourself to the hospital. Summary Gastroesophageal reflux happens when acid from the stomach flows up into the esophagus. GERD is a disease in which the reflux happens often, causes frequent or severe symptoms, or causes problems such as damage to the esophagus. Treatment for this condition may vary depending on how severe your symptoms are. Your health care provider may recommend diet and lifestyle changes, medicine, or surgery. Contact a health care provider if you have new or worsening symptoms. Take over-the-counter and prescription medicines only as told by your health care provider. Do not take aspirin, ibuprofen, or other NSAIDs unless your health care provider told you to do so. Keep all follow-up visits as told by your health care provider. This is important. This information is not intended to replace advice given to you by your health care provider. Make sure you discuss any questions you have with your health care provider. Document Revised: 10/13/2019 Document Reviewed: 10/13/2019 Elsevier Patient Education  [place].

## 2021-06-08 NOTE — Progress Notes (Signed)
Attending Physician's Attestation   I have reviewed the chart.   I agree with the Advanced Practitioner's note, impression, and recommendations with any updates as below.    Shilo Pauwels Mansouraty, MD [facility] Advanced [facility]oscopy Office # [phone]  

## 2021-06-09 ENCOUNTER — Other Ambulatory Visit (INDEPENDENT_AMBULATORY_CARE_PROVIDER_SITE_OTHER): Payer: BC Managed Care – PPO

## 2021-06-09 DIAGNOSIS — K219 Gastro-esophageal reflux disease without esophagitis: Secondary | ICD-10-CM

## 2021-06-09 DIAGNOSIS — R1030 Lower abdominal pain, unspecified: Secondary | ICD-10-CM | POA: Diagnosis not present

## 2021-06-09 LAB — H. PYLORI ANTIBODY, IGG: H Pylori IgG: NEGATIVE

## 2021-06-09 LAB — C-REACTIVE PROTEIN: CRP: 1 mg/dL (ref 0.5–20.0)

## 2021-06-09 LAB — TSH: TSH: 1.15 u[IU]/mL (ref 0.35–5.50)

## 2021-06-13 ENCOUNTER — Telehealth: Payer: Self-pay | Admitting: Physician Assistant

## 2021-06-13 ENCOUNTER — Telehealth: Payer: Self-pay | Admitting: Gastroenterology

## 2021-06-13 LAB — TISSUE TRANSGLUTAMINASE ABS,IGG,IGA
(tTG) Ab, IgA: <1 U/mL
(tTG) Ab, IgG: <1 U/mL

## 2021-06-13 LAB — IGA: Immunoglobulin A: 557 mg/dL — ABNORMAL HIGH (ref 47–310)

## 2021-06-13 NOTE — Telephone Encounter (Signed)
Patient called for lab results.

## 2021-06-13 NOTE — Telephone Encounter (Signed)
Received a prior authorization request on Friday 06/10/21 from [employer]. Received a denial 06/13/21. I had put in the form that pt only tried 1 over the counter medication but she had tried 2 medication (Prilosec, Nexium). Renewed the form Monday morning 06/13/2021 and medication was approved.

## 2021-06-14 ENCOUNTER — Other Ambulatory Visit: Payer: Self-pay

## 2021-06-14 ENCOUNTER — Ambulatory Visit (INDEPENDENT_AMBULATORY_CARE_PROVIDER_SITE_OTHER): Payer: BC Managed Care – PPO

## 2021-06-14 VITALS — BP 116/76 | HR 96 | Temp 98.6°F | Resp 16 | Ht 61.0 in | Wt 163.0 lb

## 2021-06-14 DIAGNOSIS — D509 Iron deficiency anemia, unspecified: Secondary | ICD-10-CM

## 2021-06-14 MED ORDER — DIPHENHYDRAMINE HCL 50 MG/ML IJ SOLN: 50.0000 mg | Freq: Once | INTRAMUSCULAR | Status: DC | PRN

## 2021-06-14 MED ORDER — METHYLPREDNISOLONE SODIUM SUCC 125 MG IJ SOLR: 125.0000 mg | Freq: Once | INTRAMUSCULAR | Status: DC | PRN

## 2021-06-14 MED ORDER — EPINEPHRINE 0.3 MG/0.3ML IJ SOAJ: 0.3000 mg | Freq: Once | INTRAMUSCULAR | Status: DC | PRN

## 2021-06-14 MED ORDER — ALBUTEROL SULFATE HFA 108 (90 BASE) MCG/ACT IN AERS: 2.0000 | INHALATION_SPRAY | Freq: Once | RESPIRATORY_TRACT | Status: DC | PRN

## 2021-06-14 MED ORDER — SODIUM CHLORIDE 0.9 % IV SOLN: Freq: Once | INTRAVENOUS | Status: DC | PRN

## 2021-06-14 MED ORDER — FAMOTIDINE IN NACL 20-0.9 MG/50ML-% IV SOLN: 20.0000 mg | Freq: Once | INTRAVENOUS | Status: DC | PRN

## 2021-06-14 MED ORDER — SODIUM CHLORIDE 0.9 % IV SOLN
200.0000 mg | Freq: Once | INTRAVENOUS | Status: AC
  Administered 2021-06-14: 200 mg via INTRAVENOUS
  Filled 2021-06-14: qty 10

## 2021-06-14 NOTE — Progress Notes (Signed)
Diagnosis: Iron Deficiency Anemia  Provider:  Marshell Garfinkel, MD  Procedure: Infusion  IV Type: Peripheral, IV Location: R Hand  Venofer (Iron Sucrose), Dose: 200 mg  Infusion Start Time: 5277  Infusion Stop Time: 8242  Post Infusion IV Care: Peripheral IV Discontinued  Discharge: Condition: Good, Destination: Home . AVS provided to patient.   Performed by:  Koren Shiver, RN

## 2021-06-14 NOTE — Telephone Encounter (Signed)
My Chart message re: results of labs sent by Vicie Mutters, PA-C 06/13/21 and appears to have been reviewed by pt as below:  Seen by patient Joan Simpson on 06/13/2021  4:22 PM

## 2021-06-17 NOTE — Telephone Encounter (Signed)
Returned pt call. Pt did not have any questions about her labs but rather whether she should attend her iron infusion appt that is scheduled prior to her procedure. Further adds, she is aware that she is to hold iron supplements and wondered if this included IV infusions as well. Advised she can proceed with her iron infusions as planned but should avoid oral iron supplements as indicated in her prep instructions. Verbalized acceptance and understanding. ?

## 2021-06-17 NOTE — Telephone Encounter (Signed)
Patient called to follow up and have someone call her to go over results. ?

## 2021-06-20 ENCOUNTER — Encounter: Payer: Self-pay | Admitting: Gastroenterology

## 2021-06-21 ENCOUNTER — Other Ambulatory Visit: Payer: Self-pay

## 2021-06-21 ENCOUNTER — Ambulatory Visit (INDEPENDENT_AMBULATORY_CARE_PROVIDER_SITE_OTHER): Payer: BC Managed Care – PPO | Admitting: *Deleted

## 2021-06-21 VITALS — BP 115/75 | HR 92 | Temp 98.3°F | Resp 20 | Ht 61.0 in | Wt 163.8 lb

## 2021-06-21 DIAGNOSIS — D509 Iron deficiency anemia, unspecified: Secondary | ICD-10-CM

## 2021-06-21 MED ORDER — SODIUM CHLORIDE 0.9 % IV SOLN: Freq: Once | INTRAVENOUS | Status: DC | PRN

## 2021-06-21 MED ORDER — METHYLPREDNISOLONE SODIUM SUCC 125 MG IJ SOLR: 125.0000 mg | Freq: Once | INTRAMUSCULAR | Status: DC | PRN

## 2021-06-21 MED ORDER — EPINEPHRINE 0.3 MG/0.3ML IJ SOAJ: 0.3000 mg | Freq: Once | INTRAMUSCULAR | Status: DC | PRN

## 2021-06-21 MED ORDER — ALBUTEROL SULFATE HFA 108 (90 BASE) MCG/ACT IN AERS: 2.0000 | INHALATION_SPRAY | Freq: Once | RESPIRATORY_TRACT | Status: DC | PRN

## 2021-06-21 MED ORDER — DIPHENHYDRAMINE HCL 50 MG/ML IJ SOLN: 50.0000 mg | Freq: Once | INTRAMUSCULAR | Status: DC | PRN

## 2021-06-21 MED ORDER — FAMOTIDINE IN NACL 20-0.9 MG/50ML-% IV SOLN: 20.0000 mg | Freq: Once | INTRAVENOUS | Status: DC | PRN

## 2021-06-21 MED ORDER — SODIUM CHLORIDE 0.9 % IV SOLN
200.0000 mg | Freq: Once | INTRAVENOUS | Status: AC
  Administered 2021-06-21: 200 mg via INTRAVENOUS
  Filled 2021-06-21: qty 10

## 2021-06-21 NOTE — Progress Notes (Signed)
Diagnosis: Iron Deficiency Anemia ? ?Provider:  Marshell Garfinkel, MD ? ?Procedure: Infusion ? ?IV Type: Peripheral, IV Location: R Antecubital ? ?Venofer (Iron Sucrose), Dose: 200 mg ? ?Infusion Start Time: 1011 am ? ?Infusion Stop Time: 1035 am ? ?Post Infusion IV Care: Observation period completed and Peripheral IV Discontinued ? ?Discharge: Condition: Good, Destination: Home . AVS provided to patient.  ? ?Performed by:  Oren Beckmann, RN  ?  ?

## 2021-06-23 ENCOUNTER — Ambulatory Visit (AMBULATORY_SURGERY_CENTER): Payer: BC Managed Care – PPO | Admitting: Gastroenterology

## 2021-06-23 ENCOUNTER — Encounter: Payer: Self-pay | Admitting: Gastroenterology

## 2021-06-23 ENCOUNTER — Other Ambulatory Visit: Payer: Self-pay

## 2021-06-23 VITALS — BP 117/72 | HR 55 | Temp 98.6°F | Resp 7 | Ht 61.0 in | Wt 163.0 lb

## 2021-06-23 DIAGNOSIS — K449 Diaphragmatic hernia without obstruction or gangrene: Secondary | ICD-10-CM

## 2021-06-23 DIAGNOSIS — K641 Second degree hemorrhoids: Secondary | ICD-10-CM | POA: Diagnosis not present

## 2021-06-23 DIAGNOSIS — R1030 Lower abdominal pain, unspecified: Secondary | ICD-10-CM

## 2021-06-23 DIAGNOSIS — D509 Iron deficiency anemia, unspecified: Secondary | ICD-10-CM | POA: Diagnosis not present

## 2021-06-23 DIAGNOSIS — K319 Disease of stomach and duodenum, unspecified: Secondary | ICD-10-CM | POA: Diagnosis not present

## 2021-06-23 DIAGNOSIS — K219 Gastro-esophageal reflux disease without esophagitis: Secondary | ICD-10-CM

## 2021-06-23 DIAGNOSIS — K635 Polyp of colon: Secondary | ICD-10-CM | POA: Diagnosis not present

## 2021-06-23 DIAGNOSIS — R12 Heartburn: Secondary | ICD-10-CM

## 2021-06-23 DIAGNOSIS — K229 Disease of esophagus, unspecified: Secondary | ICD-10-CM

## 2021-06-23 DIAGNOSIS — K3189 Other diseases of stomach and duodenum: Secondary | ICD-10-CM

## 2021-06-23 DIAGNOSIS — D123 Benign neoplasm of transverse colon: Secondary | ICD-10-CM

## 2021-06-23 MED ORDER — SODIUM CHLORIDE 0.9 % IV SOLN: 500.0000 mL | Freq: Once | INTRAVENOUS | Status: DC

## 2021-06-23 NOTE — Patient Instructions (Signed)
Await pathology ? ?Please read over handouts about polyps, hiatal hernias, hemorrhoids, and high fiber diets ? ?Please continue your normal medications- take Fibercon 1-2 tablets daily ? ? ? ?YOU HAD AN ENDOSCOPIC PROCEDURE TODAY AT [facility]:   Refer to the procedure report that was given to you for any specific questions about what was found during the examination.  If the procedure report does not answer your questions, please call your gastroenterologist to clarify.  If you requested that your care partner not be given the details of your procedure findings, then the procedure report has been included in a sealed envelope for you to review at your convenience later. ? ?YOU SHOULD EXPECT: Some feelings of bloating in the abdomen. Passage of more gas than usual.  Walking can help get rid of the air that was put into your GI tract during the procedure and reduce the bloating. If you had a lower endoscopy (such as a colonoscopy or flexible sigmoidoscopy) you may notice spotting of blood in your stool or on the toilet paper. If you underwent a bowel prep for your procedure, you may not have a normal bowel movement for a few days. ? ?Please Note:  You might notice some irritation and congestion in your nose or some drainage.  This is from the oxygen used during your procedure.  There is no need for concern and it should clear up in a day or so. ? ?SYMPTOMS TO REPORT IMMEDIATELY: ? ?Following lower endoscopy (colonoscopy or flexible sigmoidoscopy): ? Excessive amounts of blood in the stool ? Significant tenderness or worsening of abdominal pains ? Swelling of the abdomen that is new, acute ? Fever of 100?F or higher ? ?For urgent or emergent issues, a gastroenterologist can be reached at any hour by calling [phone]. ?Do not use MyChart messaging for urgent concerns.  ? ? ?DIET:  We do recommend a small meal at first, but then you may proceed to your regular diet.  Drink plenty of fluids but  you should avoid alcoholic beverages for 24 hours. ? ?ACTIVITY:  You should plan to take it easy for the rest of today and you should NOT DRIVE or use heavy machinery until tomorrow (because of the sedation medicines used during the test).   ? ?FOLLOW UP: ?Our staff will call the number listed on your records 48-72 hours following your procedure to check on you and address any questions or concerns that you may have regarding the information given to you following your procedure. If we do not reach you, we will leave a message.  We will attempt to reach you two times.  During this call, we will ask if you have developed any symptoms of COVID 19. If you develop any symptoms (ie: fever, flu-like symptoms, shortness of breath, cough etc.) before then, please call [phone].  If you test positive for Covid 19 in the 2 weeks post procedure, please call and report this information to [place].   ? ?If any biopsies were taken you will be contacted by phone or by letter within the next 1-3 weeks.  Please call us at [phone] if you have not heard about the biopsies in 3 weeks.  ? ? ?SIGNATURES/CONFIDENTIALITY: ?You and/or your care partner have signed paperwork which will be entered into your electronic medical record.  These signatures attest to the fact that that the information above on your After Visit Summary has been reviewed and is understood.  Full responsibility of the confidentiality  of this discharge information lies with you and/or your care-partner. ? ?

## 2021-06-23 NOTE — Op Note (Signed)
[place] ?Patient Name: Joan Simpson ?Procedure Date: 06/23/2021 4:29 PM ?MRN: [id number] ?Endoscopist: Justice Britain , MD ?Age: 31 ?Referring MD:  ?Date of Birth: 1991/01/19 ?Gender: Female ?Account #: [id number] ?Procedure:                Upper GI endoscopy ?Indications:              Iron deficiency anemia, Heartburn ?Medicines:                Monitored Anesthesia Care ?Procedure:                Pre-Anesthesia Assessment: ?                          - Prior to the procedure, a History and Physical  ?                          was performed, and patient medications and  ?                          allergies were reviewed. The patient's tolerance of  ?                          previous anesthesia was also reviewed. The risks  ?                          and benefits of the procedure and the sedation  ?                          options and risks were discussed with the patient.  ?                          All questions were answered, and informed consent  ?                          was obtained. Prior Anticoagulants: The patient has  ?                          taken no previous anticoagulant or antiplatelet  ?                          agents. ASA Grade Assessment: II - A patient with  ?                          mild systemic disease. After reviewing the risks  ?                          and benefits, the patient was deemed in  ?                          satisfactory condition to undergo the procedure. ?                          After obtaining informed consent, the endoscope was  ?  passed under direct vision. Throughout the  ?                          procedure, the patient's blood pressure, pulse, and  ?                          oxygen saturations were monitored continuously. The  ?                          GIF [id number] #[id number] was introduced through the  ?                          mouth, and advanced to the second part of duodenum.  ?                          The upper GI  endoscopy was accomplished without  ?                          difficulty. The patient tolerated the procedure. ?Scope In: ?Scope Out: ?Findings:                 No gross lesions were noted in the entire  ?                          esophagus. Biopsies were taken with a cold forceps  ?                          for histology. ?                          The Z-line was regular and was found 33 cm from the  ?                          incisors. ?                          A 6 cm hiatal hernia was present. ?                          Striped mildly erythematous mucosa without bleeding  ?                          was found in the gastric antrum. ?                          No gross lesions were noted in the entire examined  ?                          stomach. Biopsies were taken with a cold forceps  ?                          for histology and Helicobacter pylori testing. ?                          No gross lesions were  noted in the duodenal bulb,  ?                          in the first portion of the duodenum and in the  ?                          second portion of the duodenum. Biopsies for  ?                          histology were taken with a cold forceps for  ?                          evaluation of celiac disease. ?Complications:            No immediate complications. ?Estimated Blood Loss:     Estimated blood loss was minimal. ?Impression:               - No gross lesions in esophagus. Biopsied. ?                          - Z-line regular, 33 cm from the incisors. ?                          - 6 cm hiatal hernia. ?                          - Erythematous mucosa in the antrum. ?                          - No gross lesions in the stomach. Biopsied. ?                          - No gross lesions in the duodenal bulb, in the  ?                          first portion of the duodenum and in the second  ?                          portion of the duodenum. Biopsied. ?Recommendation:           - Proceed to scheduled  colonoscopy. ?                          - Continue present medications. ?                          - Await pathology results. ?                          - Return to GI clinic at appointment to be  ?                          scheduled to discuss how symptoms of GERD are  ?  controlled and follow up. ?                          - The findings and recommendations were discussed  ?                          with the patient. ?                          - The findings and recommendations were discussed  ?                          with the patient's family. ?Justice Britain, MD ?06/23/2021 5:09:00 PM ?

## 2021-06-23 NOTE — Progress Notes (Signed)
Sedate, gd SR, tolerated procedure well, VSS, report to RN 

## 2021-06-23 NOTE — Progress Notes (Signed)
Called to room to assist during endoscopic procedure.  Patient ID and intended procedure confirmed with present staff. Received instructions for my participation in the procedure from the performing physician.  

## 2021-06-23 NOTE — Op Note (Signed)
[place] ?Patient Name: Joan Simpson ?Procedure Date: 06/23/2021 4:29 PM ?MRN: [id number] ?Endoscopist: Justice Britain , MD ?Age: 31 ?Referring MD:  ?Date of Birth: March 03, 1991 ?Gender: Female ?Account #: [id number] ?Procedure:                Colonoscopy ?Indications:              Iron deficiency anemia ?Medicines:                Monitored Anesthesia Care ?Procedure:                Pre-Anesthesia Assessment: ?                          - Prior to the procedure, a History and Physical  ?                          was performed, and patient medications and  ?                          allergies were reviewed. The patient's tolerance of  ?                          previous anesthesia was also reviewed. The risks  ?                          and benefits of the procedure and the sedation  ?                          options and risks were discussed with the patient.  ?                          All questions were answered, and informed consent  ?                          was obtained. Prior Anticoagulants: The patient has  ?                          taken no previous anticoagulant or antiplatelet  ?                          agents. ASA Grade Assessment: II - A patient with  ?                          mild systemic disease. After reviewing the risks  ?                          and benefits, the patient was deemed in  ?                          satisfactory condition to undergo the procedure. ?                          After obtaining informed consent, the colonoscope  ?  was passed under direct vision. Throughout the  ?                          procedure, the patient's blood pressure, pulse, and  ?                          oxygen saturations were monitored continuously. The  ?                          Colonoscope was introduced through the anus and  ?                          advanced to the 5 cm into the ileum. The  ?                          colonoscopy was performed without  difficulty. The  ?                          patient tolerated the procedure. The quality of the  ?                          bowel preparation was good. The terminal ileum,  ?                          ileocecal valve, appendiceal orifice, and rectum  ?                          were photographed. ?Scope In: 4:45:47 PM ?Scope Out: 4:58:13 PM ?Scope Withdrawal Time: 0 hours 9 minutes 32 seconds  ?Total Procedure Duration: 0 hours 12 minutes 26 seconds  ?Findings:                 The digital rectal exam findings include  ?                          hemorrhoids. Pertinent negatives include no  ?                          palpable rectal lesions. ?                          A 6 mm polyp was found in the hepatic flexure. The  ?                          polyp was sessile. The polyp was removed with a  ?                          cold snare. Resection and retrieval were complete. ?                          Normal mucosa was found in the entire colon  ?                          otherwise. ?  Non-bleeding non-thrombosed internal hemorrhoids  ?                          were found during retroflexion, during perianal  ?                          exam and during digital exam. The hemorrhoids were  ?                          Grade II (internal hemorrhoids that prolapse but  ?                          reduce spontaneously). ?Complications:            No immediate complications. ?Estimated Blood Loss:     Estimated blood loss was minimal. ?Impression:               - Hemorrhoids found on digital rectal exam. ?                          - One 6 mm polyp at the hepatic flexure, removed  ?                          with a cold snare. Resected and retrieved. ?                          - Normal mucosa in the entire examined colon  ?                          otherwise. ?                          - Non-bleeding non-thrombosed internal hemorrhoids. ?Recommendation:           - The patient will be observed post-procedure,  ?                           until all discharge criteria are met. ?                          - Discharge patient to home. ?                          - Patient has a contact number available for  ?                          emergencies. The signs and symptoms of potential  ?                          delayed complications were discussed with the  ?                          patient. Return to normal activities tomorrow.  ?                          Written discharge instructions were provided to the  ?  patient. ?                          - High fiber diet. ?                          - Use FiberCon 1-2 tablets PO daily. ?                          - Continue present medications. ?                          - Await pathology results. ?                          - Repeat colonoscopy for surveillance based on  ?                          pathology results. If this is adenomatous, I would  ?                          recommend a 3-year follow up due to her early age.  ?                          If benign and not adenomatous then colonoscopy at  ?                          age 44 per guidelines at this time. ?                          - If all pathology is negative, then will need to  ?                          discuss with Hematology to consider VCE though  ?                          pretest probability of this being GI related is low  ?                          due to her age though she does not have heavy or  ?                          frequent menstruation. ?                          - The findings and recommendations were discussed  ?                          with the patient. ?                          - The findings and recommendations were discussed  ?                          with the patient's family. ?[employer],  MD ?06/23/2021 5:14:24 PM ?

## 2021-06-23 NOTE — Progress Notes (Signed)
? ?GASTROENTEROLOGY PROCEDURE H&P NOTE  ? ?Primary Care Physician: ?Patient, No Pcp Per (Inactive) ? ?HPI: ?Joan Simpson is a 31 y.o. female who presents for EGD/Colonoscopy in setting of IDA, GERD. ? ?Past Medical History:  ?Diagnosis Date  ? Allergy   ? Anemia   ? Anxiety   ? Bipolar 1 disorder ([place])   ? Depression   ? Hypertension   ? ?Past Surgical History:  ?Procedure Laterality Date  ? NO PAST SURGERIES    ? ?Current Outpatient Medications  ?Medication Sig Dispense Refill  ? Armodafinil 250 MG tablet Take 250 mg by mouth daily.    ? lamoTRIgine (LAMICTAL) 200 MG tablet Take 200 mg by mouth daily.    ? LATUDA 40 MG TABS tablet Take 40 mg by mouth at bedtime.    ? metFORMIN (GLUCOPHAGE) 500 MG tablet Take 500 mg by mouth 2 (two) times daily.    ? naproxen sodium (ALEVE) 220 MG tablet Take 1 tablet (220 mg total) by mouth 2 (two) times daily as needed (headache, pain). 8 tablet 0  ? pantoprazole (PROTONIX) 40 MG tablet Take 1 tablet (40 mg total) by mouth 2 (two) times daily before a meal. 60 tablet 1  ? PEG-KCl-NaCl-NaSulf-Na Asc-C (PLENVU) 140 g SOLR Use as directed. Manufacturer's coupon Universal coupon code:BIN: P2366821; GROUP: [id number]; PCN: [id number]; ID: [id number]; PAY NO MORE $50; NO prior authorization 1 each 0  ? ?No current facility-administered medications for this visit.  ? ? ?Current Outpatient Medications:  ?  Armodafinil 250 MG tablet, Take 250 mg by mouth daily., Disp: , Rfl:  ?  lamoTRIgine (LAMICTAL) 200 MG tablet, Take 200 mg by mouth daily., Disp: , Rfl:  ?  LATUDA 40 MG TABS tablet, Take 40 mg by mouth at bedtime., Disp: , Rfl:  ?  metFORMIN (GLUCOPHAGE) 500 MG tablet, Take 500 mg by mouth 2 (two) times daily., Disp: , Rfl:  ?  naproxen sodium (ALEVE) 220 MG tablet, Take 1 tablet (220 mg total) by mouth 2 (two) times daily as needed (headache, pain)., Disp: 8 tablet, Rfl: 0 ?  pantoprazole (PROTONIX) 40 MG tablet, Take 1 tablet (40 mg total) by mouth 2 (two) times daily before a meal.,  Disp: 60 tablet, Rfl: 1 ?  PEG-KCl-NaCl-NaSulf-Na Asc-C (PLENVU) 140 g SOLR, Use as directed. Manufacturer's coupon Universal coupon code:BIN: P2366821; GROUP: [id number]; PCN: [id number]; ID: [id number]; PAY NO MORE $50; NO prior authorization, Disp: 1 each, Rfl: 0 ?Allergies  ?Allergen Reactions  ? Erythromycin Anaphylaxis and Swelling  ?  Says she throws up and can't breath ?Says she throws up and can't breath  ? ?Family History  ?Problem Relation Age of Onset  ? Hypertension Mother   ? Heart disease Mother   ? Other Father   ?     prediabetes  ? Hypertension Father   ? Heart disease Brother   ? Diabetes Paternal Grandmother   ? ?Social History  ? ?Socioeconomic History  ? Marital status: Single  ?  Spouse name: Not on file  ? Number of children: 1  ? Years of education: Not on file  ? Highest education level: Not on file  ?Occupational History  ? Occupation: Actor  ?Tobacco Use  ? Smoking status: Never  ? Smokeless tobacco: Never  ?Vaping Use  ? Vaping Use: Never used  ?Substance and Sexual Activity  ? Alcohol use: Yes  ?  Comment: once-twice a year  ? Drug use: No  ? Sexual activity: Yes  ?  Birth control/protection: Implant  ?Other Topics Concern  ? Not on file  ?Social History Narrative  ? Not on file  ? ?Social Determinants of Health  ? ?Financial Resource Strain: Not on file  ?Food Insecurity: Not on file  ?Transportation Needs: Not on file  ?Physical Activity: Not on file  ?Stress: Not on file  ?Social Connections: Not on file  ?Intimate Partner Violence: Not on file  ? ? ?Physical Exam: ?There were no vitals filed for this visit. ?There is no height or weight on file to calculate BMI. ?GEN: NAD ?EYE: Sclerae anicteric ?ENT: MMM ?CV: Non-tachycardic ?GI: Soft, NT/ND ?NEURO:  Alert & Oriented x 3 ? ?Lab Results: ?No results for input(s): WBC, HGB, HCT, PLT in the last 72 hours. ?BMET ?No results for input(s): NA, K, CL, CO2, GLUCOSE, BUN, CREATININE, CALCIUM in the last 72 hours. ?LFT ?No results for  input(s): PROT, ALBUMIN, AST, ALT, ALKPHOS, BILITOT, BILIDIR, IBILI in the last 72 hours. ?PT/INR ?No results for input(s): LABPROT, INR in the last 72 hours. ? ? ?Impression / Plan: ?This is a 31 y.o.female who presents for EGD/Colonoscopy in setting of IDA, GERD. ? ?The risks and benefits of endoscopic evaluation/treatment were discussed with the patient and/or family; these include but are not limited to the risk of perforation, infection, bleeding, missed lesions, lack of diagnosis, severe illness requiring hospitalization, as well as anesthesia and sedation related illnesses.  The patient's history has been reviewed, patient examined, no change in status, and deemed stable for procedure.  The patient and/or family is agreeable to proceed.  ? ? ?Justice Britain, MD ?[facility] ?Advanced Endoscopy ?Office # [phone] ? ?

## 2021-06-24 ENCOUNTER — Telehealth: Payer: Self-pay

## 2021-06-24 NOTE — Telephone Encounter (Signed)
Patient has been scheduled for a follow up with Dr. Rush Landmark on 07/28/21 at 3:10 pm. Patient is aware. ?

## 2021-06-27 ENCOUNTER — Telehealth: Payer: Self-pay | Admitting: *Deleted

## 2021-06-27 NOTE — Telephone Encounter (Signed)
?  Follow up Call- ? ?Call back number 06/23/2021  ?Post procedure Call Back phone  # [phone]  ?Permission to leave phone message Yes  ?Some recent data might be hidden  ?  ? ?Patient questions: ? ?Do you have a fever, pain , or abdominal swelling? No. ?Pain Score  0 * ? ?Have you tolerated food without any problems? Yes.   ? ?Have you been able to return to your normal activities? Yes.   ? ?Do you have any questions about your discharge instructions: ?Diet   No. ?Medications  No. ?Follow up visit  No. ? ?Do you have questions or concerns about your Care? No. ? ?Actions: ?* If pain score is 4 or above: ?No action needed, pain <4. ? ? ?

## 2021-06-30 ENCOUNTER — Encounter: Payer: Self-pay | Admitting: Gastroenterology

## 2021-07-01 ENCOUNTER — Encounter: Payer: Self-pay | Admitting: Gastroenterology

## 2021-07-05 ENCOUNTER — Other Ambulatory Visit: Payer: Self-pay

## 2021-07-05 DIAGNOSIS — R1030 Lower abdominal pain, unspecified: Secondary | ICD-10-CM

## 2021-07-05 DIAGNOSIS — D509 Iron deficiency anemia, unspecified: Secondary | ICD-10-CM

## 2021-07-07 ENCOUNTER — Other Ambulatory Visit: Payer: BC Managed Care – PPO

## 2021-07-07 DIAGNOSIS — R1030 Lower abdominal pain, unspecified: Secondary | ICD-10-CM

## 2021-07-07 DIAGNOSIS — D509 Iron deficiency anemia, unspecified: Secondary | ICD-10-CM

## 2021-07-08 ENCOUNTER — Other Ambulatory Visit: Payer: BC Managed Care – PPO

## 2021-07-08 DIAGNOSIS — D509 Iron deficiency anemia, unspecified: Secondary | ICD-10-CM

## 2021-07-08 DIAGNOSIS — R1030 Lower abdominal pain, unspecified: Secondary | ICD-10-CM

## 2021-07-08 LAB — IGG, IGA, IGM
IgG (Immunoglobin G), Serum: 917 mg/dL (ref 600–1640)
IgM, Serum: 153 mg/dL (ref 50–300)
Immunoglobulin A: 517 mg/dL — ABNORMAL HIGH (ref 47–310)

## 2021-07-10 LAB — H. PYLORI ANTIGEN, STOOL: H pylori Ag, Stl: NEGATIVE

## 2021-07-12 LAB — OVA AND PARASITE EXAMINATION
CONCENTRATE RESULT:: NONE SEEN
MICRO NUMBER:: 13176693
SPECIMEN QUALITY:: ADEQUATE
TRICHROME RESULT:: NONE SEEN

## 2021-07-18 ENCOUNTER — Encounter: Payer: Self-pay | Admitting: Gastroenterology

## 2021-07-19 ENCOUNTER — Other Ambulatory Visit: Payer: Self-pay

## 2021-07-19 DIAGNOSIS — R110 Nausea: Secondary | ICD-10-CM

## 2021-07-19 DIAGNOSIS — R1011 Right upper quadrant pain: Secondary | ICD-10-CM

## 2021-07-19 MED ORDER — RABEPRAZOLE SODIUM 20 MG PO TBEC: 20.0000 mg | DELAYED_RELEASE_TABLET | Freq: Two times a day (BID) | ORAL | 3 refills | Status: DC

## 2021-07-19 MED ORDER — ONDANSETRON 8 MG PO TBDP: 8.0000 mg | ORAL_TABLET | Freq: Three times a day (TID) | ORAL | 1 refills | Status: DC | PRN

## 2021-07-19 MED ORDER — DICYCLOMINE HCL 20 MG PO TABS: 40.0000 mg | ORAL_TABLET | ORAL | 3 refills | Status: DC

## 2021-07-19 NOTE — Telephone Encounter (Signed)
Joan Simpson, ?Please reach out to the patient. ?In regards to nausea, please offer the patient Zofran ODT 8 mg every 8 hour as needed (30/1 refill). ?Recommend consideration of a right upper quadrant ultrasound to ensure no evidence of gallstone disease (nothing noted on CT but ultrasound is more sensitive). ?For cramping and discomfort, recommend consideration of Bentyl 20 mg 2-4 times daily as needed. ?I suspect her acid reflux is exacerbated by her large hiatal hernia.  Sometimes, making a change in PPI can make a difference.  If she wants to make a change in PPI then would transition to Dexilant 30 mg twice daily or Aciphex 20 mg twice daily. ?We may need to consider surgical referral if we believe the hiatal hernia is playing a role with her acid, though they may need a pH impedance test to be done to ensure nothing else is being missed since she did not have evidence of overt/severe esophagitis. ?Set her up for a follow-up in clinic in 4 weeks after transitioning her PPI therapy with [facility] or myself. ?Thanks. ?GM ?

## 2021-07-21 ENCOUNTER — Other Ambulatory Visit: Payer: Self-pay | Admitting: Physician Assistant

## 2021-07-21 DIAGNOSIS — D509 Iron deficiency anemia, unspecified: Secondary | ICD-10-CM

## 2021-07-22 ENCOUNTER — Inpatient Hospital Stay: Payer: BC Managed Care – PPO | Attending: Physician Assistant

## 2021-07-22 ENCOUNTER — Inpatient Hospital Stay (HOSPITAL_BASED_OUTPATIENT_CLINIC_OR_DEPARTMENT_OTHER): Payer: BC Managed Care – PPO | Admitting: Physician Assistant

## 2021-07-22 ENCOUNTER — Other Ambulatory Visit: Payer: Self-pay

## 2021-07-22 ENCOUNTER — Telehealth: Payer: Self-pay

## 2021-07-22 VITALS — BP 110/68 | HR 82 | Temp 97.9°F | Wt 169.8 lb

## 2021-07-22 DIAGNOSIS — D509 Iron deficiency anemia, unspecified: Secondary | ICD-10-CM | POA: Insufficient documentation

## 2021-07-22 DIAGNOSIS — R5383 Other fatigue: Secondary | ICD-10-CM | POA: Insufficient documentation

## 2021-07-22 LAB — CBC WITH DIFFERENTIAL (CANCER CENTER ONLY)
Abs Immature Granulocytes: 0.04 10*3/uL (ref 0.00–0.07)
Basophils Absolute: 0 10*3/uL (ref 0.0–0.1)
Basophils Relative: 0 %
Eosinophils Absolute: 0.1 10*3/uL (ref 0.0–0.5)
Eosinophils Relative: 1 %
HCT: 35.7 % — ABNORMAL LOW (ref 36.0–46.0)
Hemoglobin: 11.7 g/dL — ABNORMAL LOW (ref 12.0–15.0)
Immature Granulocytes: 1 %
Lymphocytes Relative: 28 %
Lymphs Abs: 2.3 10*3/uL (ref 0.7–4.0)
MCH: 28.3 pg (ref 26.0–34.0)
MCHC: 32.8 g/dL (ref 30.0–36.0)
MCV: 86.2 fL (ref 80.0–100.0)
Monocytes Absolute: 0.4 10*3/uL (ref 0.1–1.0)
Monocytes Relative: 5 %
Neutro Abs: 5.5 10*3/uL (ref 1.7–7.7)
Neutrophils Relative %: 65 %
Platelet Count: 355 10*3/uL (ref 150–400)
RBC: 4.14 MIL/uL (ref 3.87–5.11)
RDW: 21.8 % — ABNORMAL HIGH (ref 11.5–15.5)
WBC Count: 8.4 10*3/uL (ref 4.0–10.5)
nRBC: 0 % (ref 0.0–0.2)

## 2021-07-22 LAB — IRON AND IRON BINDING CAPACITY (CC-WL,HP ONLY)
Iron: 64 ug/dL (ref 28–170)
Saturation Ratios: 21 % (ref 10.4–31.8)
TIBC: 301 ug/dL (ref 250–450)
UIBC: 237 ug/dL (ref 148–442)

## 2021-07-22 LAB — FERRITIN: Ferritin: 108 ng/mL (ref 11–307)

## 2021-07-22 NOTE — Progress Notes (Signed)
?[place] ?Telephone:(336) [phone]   [phone] ? ?PROGRESS NOTE ? ?Patient Care Team: ?Patient, No Pcp Per (Inactive) as PCP - General (General Practice) ? ?CHIEF COMPLAINTS/PURPOSE OF CONSULTATION:  ?Iron deficiency anemia with thrombocytosis.  ? ?HISTORY OF PRESENTING ILLNESS:  ?Joan Simpson 31 y.o. female  returns for a follow up for iron deficiency anemia. She was last seen in clinic on 05/20/2021. In the interim, she received IV venofer x 5 doses.  ? ?At today's visit, Joan Simpson reports that her energy levels improved briefly after receiving IV iron but now it is back to her baseline fatigue. She can complete all her daily activities on her own but still needs to rest often. She denies any appetite or weight changes. She has ongoing nausea, vomiting with abdominal cramping. She was recently started on zofran and bentyl for the nausea/vomiting and cramps. Since undergoing endoscopic evaluation on 06/23/2021, she reports having constipation. She is not taking any laxatives or stool softeners. She denies easy bruising or signs of active bleeding. Her menstrual bleeding is light and infrequent since she is on birth control. She has occasional episodes of headaches and dizziness with triggers unknown. She denies any syncopal episodes. Patient denies fevers, chills,night sweats, shortness of breath, chest pain or cough. She has no other complaints. Rest of the 10 point ROS is below.  ? ?MEDICAL HISTORY:  ?Past Medical History:  ?Diagnosis Date  ? Allergy   ? Anemia   ? Anxiety   ? Bipolar 1 disorder ([place])   ? Depression   ? Hypertension   ? ? ?SURGICAL HISTORY: ?Past Surgical History:  ?Procedure Laterality Date  ? NO PAST SURGERIES    ? ? ?SOCIAL HISTORY: ?Social History  ? ?Socioeconomic History  ? Marital status: Single  ?  Spouse name: Not on file  ? Number of children: 1  ? Years of education: Not on file  ? Highest education level: Not on file  ?Occupational History  ?  Occupation: Actor  ?Tobacco Use  ? Smoking status: Never  ? Smokeless tobacco: Never  ?Vaping Use  ? Vaping Use: Never used  ?Substance and Sexual Activity  ? Alcohol use: Yes  ?  Comment: once-twice a year  ? Drug use: No  ? Sexual activity: Yes  ?  Birth control/protection: Implant  ?Other Topics Concern  ? Not on file  ?Social History Narrative  ? Not on file  ? ?Social Determinants of Health  ? ?Financial Resource Strain: Not on file  ?Food Insecurity: Not on file  ?Transportation Needs: Not on file  ?Physical Activity: Not on file  ?Stress: Not on file  ?Social Connections: Not on file  ?Intimate Partner Violence: Not on file  ? ? ?FAMILY HISTORY: ?Family History  ?Problem Relation Age of Onset  ? Hypertension Mother   ? Heart disease Mother   ? Other Father   ?     prediabetes  ? Hypertension Father   ? Heart disease Brother   ? Diabetes Paternal Grandmother   ? ? ?ALLERGIES:  is allergic to erythromycin. ? ?MEDICATIONS:  ?Current Outpatient Medications  ?Medication Sig Dispense Refill  ? Armodafinil 250 MG tablet Take 250 mg by mouth daily.    ? buPROPion (WELLBUTRIN XL) 300 MG 24 hr tablet Take 300 mg by mouth daily.    ? dicyclomine (BENTYL) 20 MG tablet Take 2 tablets (40 mg total) by mouth as directed. Take 2-4 times daily as needed 60 tablet 3  ?  lamoTRIgine (LAMICTAL) 200 MG tablet Take 200 mg by mouth daily.    ? LATUDA 40 MG TABS tablet Take 40 mg by mouth at bedtime.    ? metFORMIN (GLUCOPHAGE) 500 MG tablet Take 500 mg by mouth 2 (two) times daily.    ? naproxen sodium (ALEVE) 220 MG tablet Take 1 tablet (220 mg total) by mouth 2 (two) times daily as needed (headache, pain). 8 tablet 0  ? ondansetron (ZOFRAN-ODT) 8 MG disintegrating tablet Take 1 tablet (8 mg total) by mouth every 8 (eight) hours as needed for nausea or vomiting. 30 tablet 1  ? RABEprazole (ACIPHEX) 20 MG tablet Take 1 tablet (20 mg total) by mouth 2 (two) times daily. 60 tablet 3  ? pantoprazole (PROTONIX) 40 MG  tablet Take 1 tablet (40 mg total) by mouth 2 (two) times daily before a meal. 60 tablet 1  ? ?No current facility-administered medications for this visit.  ? ? ?REVIEW OF SYSTEMS:   ?Constitutional: ( - ) fevers, ( - )  chills , ( - ) night sweats ?Eyes: ( - ) blurriness of vision, ( - ) double vision, ( - ) watery eyes ?Ears, nose, mouth, throat, and face: ( - ) mucositis, ( - ) sore throat ?Respiratory: ( - ) cough, ( - ) dyspnea, ( - ) wheezes ?Cardiovascular: ( - ) palpitation, ( - ) chest discomfort, ( - ) lower extremity swelling ?Gastrointestinal:  ( +) nausea, ( + ) heartburn, ( +) change in bowel habits ?Skin: ( - ) abnormal skin rashes ?Lymphatics: ( - ) new lymphadenopathy, ( - ) easy bruising ?Neurological: ( +) numbness, ( - ) tingling, ( - ) new weaknesses ?Behavioral/Psych: ( - ) mood change, ( - ) new changes  ?All other systems were reviewed with the patient and are negative. ? ?PHYSICAL EXAMINATION: ?ECOG PERFORMANCE STATUS: 1 - Symptomatic but completely ambulatory ? ?Vitals:  ? 07/22/21 0823  ?BP: 110/68  ?Pulse: 82  ?Temp: 97.9 ?F (36.6 ?C)  ?SpO2: 100%  ? ?Filed Weights  ? 07/22/21 0823  ?Weight: 169 lb 12.8 oz (77 kg)  ? ? ?GENERAL: well appearing female in NAD  ?SKIN: skin color, texture, turgor are normal, no rashes or significant lesions ?EYES: conjunctiva are pink and non-injected, sclera clear ?NECK: supple, non-tender ?LYMPH:  no palpable lymphadenopathy in the cervical or supraclavicular lymph nodes.  ?LUNGS: clear to auscultation and percussion with normal breathing effort ?HEART: regular rate & rhythm and no murmurs and no lower extremity edema ?Musculoskeletal: no cyanosis of digits and no clubbing  ?PSYCH: alert & oriented x 3, fluent speech ?NEURO: no focal motor/sensory deficits ? ?LABORATORY DATA:  ?I have reviewed the data as listed ? ?  Latest Ref Rng & Units 07/22/2021  ?  8:07 AM 05/20/2021  ?  9:48 AM 02/03/2021  ? 10:44 AM  ?CBC  ?WBC 4.0 - 10.5 K/uL 8.4   6.3     ?Hemoglobin  12.0 - 15.0 g/dL 11.7   8.8   9.9    ?Hematocrit 36.0 - 46.0 % 35.7   28.4   29.0    ?Platelets 150 - 400 K/uL 355   521     ? ? ? ?  Latest Ref Rng & Units 05/20/2021  ?  9:48 AM 02/03/2021  ? 10:44 AM 02/03/2021  ? 10:37 AM  ?CMP  ?Glucose 70 - 99 mg/dL 88   94   98    ?BUN 6 - 20 mg/dL 9  13   13    ?Creatinine 0.44 - 1.00 mg/dL 0.75   0.80   0.78    ?Sodium 135 - 145 mmol/L 140   140   139    ?Potassium 3.5 - 5.1 mmol/L 3.8   3.9   4.0    ?Chloride 98 - 111 mmol/L 107   107   108    ?CO2 22 - 32 mmol/L 25    23    ?Calcium 8.9 - 10.3 mg/dL 9.5    9.3    ?Total Protein 6.5 - 8.1 g/dL 7.7    7.3    ?Total Bilirubin 0.3 - 1.2 mg/dL 0.3    0.3    ?Alkaline Phos 38 - 126 U/L 65    73    ?AST 15 - 41 U/L 15    17    ?ALT 0 - 44 U/L 17    22    ? ? ?ASSESSMENT & PLAN ?Joan Simpson is a 31 y.o. female who returns for a follow up for iron deficiency anemia and thrombocytosis.   ? ?#Iron deficiency anemia/thrombocytosis: ?--Unlikely secondary to GYN bleeding since patient reports light menstrual cycles while on birth control ?--Patient underwent EGD/colonoscopy on 06/23/2021 with Dr. Rush Landmark without any evidence of active/recent bleeding or source of IDA ?--Could be secondary to malabsorption due to PPI use  ?--Patient received IV venofer 200 mg once a week x 5 doses from 05/24/2021-06/21/2021. ?--Labs today show anemia has markedly improved from 8.8 to 11.7. Platelet count has normalized to 355K. Iron panel shows no evidence deficiency. ?--Additional IV iron is not needed at this time.  ?--RTC in 3 months with repeat labs ? ?#Fatigue: ?--Since anemia has improved and patient continues to feel fatigued, I advised patient to follow up with PCP to evaluate for other causes.  ?--She is scheduled to establish care with PCP on 08/03/2021 ? ?#GI symptoms: ?--Patient is under the care of Dr. Rush Landmark. ?--She is taking Aciphex 20 mg BID for acid reflux ?--She is taking zofran 8 mg q 8 hours for nausea/vomiting ?--She is taking  bentyl 40 mg PRN for abdominal cramping. Scheduled for abdominal US next week.  ?--Advised to try OTC senna or miralax for the constipation.  ? ?Orders Placed This Encounter  ?Procedures  ? CBC with Differential (Ca

## 2021-07-22 NOTE — Telephone Encounter (Signed)
Pt advised with VU 

## 2021-07-22 NOTE — Telephone Encounter (Signed)
-----   Message from Lincoln Brigham, PA-C sent at 07/22/2021  9:57 AM EDT ----- ?Please notify patient that iron levels show no more deficiency. No need for additional IV iron. Plan to see her back in 3 months with repeat labs.  ? ?

## 2021-07-25 ENCOUNTER — Telehealth: Payer: Self-pay | Admitting: Hematology and Oncology

## 2021-07-25 NOTE — Telephone Encounter (Signed)
Per 4/7 los.  Spoke to pt.  Pr confirmed appointment  ?

## 2021-07-27 ENCOUNTER — Ambulatory Visit (HOSPITAL_COMMUNITY)
Admission: RE | Admit: 2021-07-27 | Discharge: 2021-07-27 | Disposition: A | Discharge status: Home / Self Care | Payer: BC Managed Care – PPO | Source: Ambulatory Visit | Attending: Gastroenterology | Admitting: Gastroenterology

## 2021-07-27 DIAGNOSIS — R1011 Right upper quadrant pain: Secondary | ICD-10-CM | POA: Diagnosis present

## 2021-07-27 DIAGNOSIS — R110 Nausea: Secondary | ICD-10-CM | POA: Diagnosis present

## 2021-07-28 ENCOUNTER — Ambulatory Visit (INDEPENDENT_AMBULATORY_CARE_PROVIDER_SITE_OTHER): Payer: BC Managed Care – PPO | Admitting: Gastroenterology

## 2021-07-28 ENCOUNTER — Encounter: Payer: Self-pay | Admitting: Gastroenterology

## 2021-07-28 VITALS — BP 126/62 | HR 69 | Ht 59.0 in | Wt 167.0 lb

## 2021-07-28 DIAGNOSIS — R109 Unspecified abdominal pain: Secondary | ICD-10-CM

## 2021-07-28 DIAGNOSIS — R1114 Bilious vomiting: Secondary | ICD-10-CM | POA: Diagnosis not present

## 2021-07-28 DIAGNOSIS — K219 Gastro-esophageal reflux disease without esophagitis: Secondary | ICD-10-CM

## 2021-07-28 DIAGNOSIS — L290 Pruritus ani: Secondary | ICD-10-CM

## 2021-07-28 DIAGNOSIS — K649 Unspecified hemorrhoids: Secondary | ICD-10-CM

## 2021-07-28 DIAGNOSIS — K449 Diaphragmatic hernia without obstruction or gangrene: Secondary | ICD-10-CM | POA: Diagnosis not present

## 2021-07-28 LAB — BASIC METABOLIC PANEL
BUN: 8 (ref 4–21)
Chloride: 104 (ref 99–108)
Creatinine: 0.8 (ref 0.5–1.1)
Glucose: 85
Potassium: 4.5 mEq/L (ref 3.5–5.1)
Sodium: 140 (ref 137–147)

## 2021-07-28 LAB — VITAMIN D 25 HYDROXY (VIT D DEFICIENCY, FRACTURES): Vit D, 25-Hydroxy: 19.8

## 2021-07-28 LAB — LIPID PANEL
Cholesterol: 238 — AB (ref 0–200)
HDL: 56 (ref 35–70)
LDL Cholesterol: 164
Triglycerides: 100 (ref 40–160)

## 2021-07-28 LAB — COMPREHENSIVE METABOLIC PANEL
Albumin: 4.6 (ref 3.5–5.0)
Calcium: 10.2 (ref 8.7–10.7)

## 2021-07-28 LAB — HEPATIC FUNCTION PANEL
AST: 18 (ref 13–35)
Alkaline Phosphatase: 75 (ref 25–125)
Bilirubin, Total: 0.2

## 2021-07-28 LAB — HEMOGLOBIN A1C: Hemoglobin A1C: 4.9

## 2021-07-28 NOTE — Patient Instructions (Addendum)
You have been scheduled for an esophageal manometry test at [facility] on 11/23/21 at 8:30am. Please arrive 30 minutes prior to your procedure for registration. You will need to go to outpatient registration (1st floor of the hospital) first. Make certain to bring your insurance cards as well as a complete list of medications. ? ?Please remember the following: ? ?1) Do not take any muscle relaxants, xanax (alprazolam) or ativan for 1 day prior to your test as well as the day of the test. ? ?2) Nothing to eat or drink after 12:00 midnight on the night before your test. ? ?3) Hold all diabetic medications/insulin the morning of the test. You may eat and take your medications after the test. ? ?It will take at least 2 weeks to receive the results of this test from your physician. ? ?------------------------------------------ ?ABOUT ESOPHAGEAL MANOMETRY ?Esophageal manometry (muh-NOM-uh-tree) is a test that gauges how well your esophagus works. Your esophagus is the long, muscular tube that connects your throat to your stomach. Esophageal manometry measures the rhythmic muscle contractions (peristalsis) that occur in your esophagus when you swallow. Esophageal manometry also measures the coordination and force exerted by the muscles of your esophagus.  ?During esophageal manometry, a thin, flexible tube (catheter) that contains sensors is passed through your nose, down your esophagus and into your stomach. Esophageal manometry can be helpful in diagnosing some mostly uncommon disorders that affect your esophagus.  ?Why it's done ?Esophageal manometry is used to evaluate the movement (motility) of food through the esophagus and into the stomach. The test measures how well the circular bands of muscle (sphincters) at the top and bottom of your esophagus open and close, as well as the pressure, strength and pattern of the wave of esophageal muscle contractions that moves food along.  ?What you can  expect ?Esophageal manometry is an outpatient procedure done without sedation. Most people tolerate it well. You may be asked to change into a hospital gown before the test starts.  ?During esophageal manometry  ?While you are sitting up, a member of your health care team sprays your throat with a numbing medication or puts numbing gel in your nose or both.  ?A catheter is guided through your nose into your esophagus. The catheter may be sheathed in a water-filled sleeve. It doesn't interfere with your breathing. However, your eyes may water, and you may gag. You may have a slight nosebleed from irritation.  ?After the catheter is in place, you may be asked to lie on your back on an exam table, or you may be asked to remain seated.  ?You then swallow small sips of water. As you do, a computer connected to the catheter records the pressure, strength and pattern of your esophageal muscle contractions.  ?During the test, you'll be asked to breathe slowly and smoothly, remain as still as possible, and swallow only when you're asked to do so.  ?A member of your health care team may move the catheter down into your stomach while the catheter continues its measurements.  ?The catheter then is slowly withdrawn. ?The test usually lasts 20 to 30 minutes.  ?After esophageal manometry  ?When your esophageal manometry is complete, you may return to your normal activities ? ?This test typically takes 30-45 minutes to complete. ? ?Consider trying Gaviscon if you continue to have significant Gerd.  ? ?Take Zofran at bedtime.  ? ?You have been referred to [facility]. Make certain to bring a list of current medications,  including any over the counter medications or vitamins. Also bring your co-pay if you have one as well as your insurance cards. [facility] is located at [address], [address]. If you have not heard from their office in 1-2  weeks, please contact them at [phone]. ?  ?Thank you  for choosing me and [facility]. ? ?Dr. Rush Landmark ? ? ?

## 2021-07-29 ENCOUNTER — Encounter: Payer: Self-pay | Admitting: Gastroenterology

## 2021-07-29 DIAGNOSIS — L290 Pruritus ani: Secondary | ICD-10-CM | POA: Insufficient documentation

## 2021-07-29 DIAGNOSIS — K219 Gastro-esophageal reflux disease without esophagitis: Secondary | ICD-10-CM | POA: Insufficient documentation

## 2021-07-29 DIAGNOSIS — R109 Unspecified abdominal pain: Secondary | ICD-10-CM | POA: Insufficient documentation

## 2021-07-29 DIAGNOSIS — K449 Diaphragmatic hernia without obstruction or gangrene: Secondary | ICD-10-CM | POA: Insufficient documentation

## 2021-07-29 DIAGNOSIS — R1114 Bilious vomiting: Secondary | ICD-10-CM | POA: Insufficient documentation

## 2021-07-29 DIAGNOSIS — K649 Unspecified hemorrhoids: Secondary | ICD-10-CM | POA: Insufficient documentation

## 2021-07-29 NOTE — Progress Notes (Signed)
? ?GASTROENTEROLOGY OUTPATIENT CLINIC VISIT  ? ?Primary Care Provider ?Janith Lima, MD ?[place] [address] ?[phone] ? ? ?Patient Profile: ?Joan Simpson is a 31 y.o. female with a pmh significant for bipolar disorder, MDD, anxiety, hypertension, GERD, large hiatal hernia, hemorrhoids, colon polyps (TA).  The patient presents to the [facility] for an evaluation and management of problem(s) noted below: ? ?Problem List ?1. Hiatal hernia   ?2. Gastroesophageal reflux disease without esophagitis   ?3. Bilious vomiting with nausea   ?4. Abdominal cramping   ?5. Hemorrhoids, unspecified hemorrhoid type   ?6. Rectal itching   ? ? ?History of Present Illness ?Please see prior notes for full details of HPI. ? ?Interval History ?The patient returns for follow-up.  She continues to experience significant heartburn.  She is experiencing episodes of nausea with vomiting on a near nightly basis.  She is using antiemetics on a near daily basis.  She has had to use Tums with some effectiveness.  We had transitioned her from her last PPI to Aciphex.  She does notice some improvement in her GERD symptoms but it cannot get her heartburn under complete control.  She does know however if she stopped Aciphex she certainly would feel much worse.  She has not had any unintentional weight loss.  The antispasmodic has been helpful for her lower abdominal cramping.  She has had some rectal itching which has been attributed now to her internal hemorrhoids and she has not used Preparation H or Anusol suppositories in the past.  During her endoscopy biopsies there was some findings that suggested potential need for rule out of C VID as well as celiac and her serologies do not show any evidence of that.Joan Simpson is interested in further management of her symptoms and is ready for a consideration of a surgical referral if we feel it is indicated. ? ?GI Review of Systems ?Positive as  above ?Negative for dysphagia, odynophagia, decreased appetite, alteration of bowel habits, melena, hematochezia ? ?Review of Systems ?General: Denies fevers/chills/weight loss unintentionally ?Cardiovascular: Denies chest pain/ ?Pulmonary: Denies shortness of breath ?Gastroenterological: See HPI ?Genitourinary: Denies darkened urine ?Hematological: Denies easy bruising/bleeding ?Dermatological: Denies jaundice ?Psychological: Mood is stable ? ? ?Medications ?Current Outpatient Medications  ?Medication Sig Dispense Refill  ? Armodafinil 250 MG tablet Take 250 mg by mouth daily.    ? buPROPion (WELLBUTRIN XL) 300 MG 24 hr tablet Take 300 mg by mouth daily.    ? dicyclomine (BENTYL) 20 MG tablet Take 2 tablets (40 mg total) by mouth as directed. Take 2-4 times daily as needed 60 tablet 3  ? lamoTRIgine (LAMICTAL) 200 MG tablet Take 200 mg by mouth daily.    ? LATUDA 40 MG TABS tablet Take 40 mg by mouth at bedtime.    ? metFORMIN (GLUCOPHAGE) 500 MG tablet Take 500 mg by mouth 2 (two) times daily.    ? naproxen sodium (ALEVE) 220 MG tablet Take 1 tablet (220 mg total) by mouth 2 (two) times daily as needed (headache, pain). 8 tablet 0  ? ondansetron (ZOFRAN-ODT) 8 MG disintegrating tablet Take 1 tablet (8 mg total) by mouth every 8 (eight) hours as needed for nausea or vomiting. 30 tablet 1  ? RABEprazole (ACIPHEX) 20 MG tablet Take 1 tablet (20 mg total) by mouth 2 (two) times daily. 60 tablet 3  ? ?No current facility-administered medications for this visit.  ? ? ?Allergies ?Allergies  ?Allergen Reactions  ? Erythromycin Anaphylaxis and Swelling  ?  Says she throws up and can't breath ?  ? ? ?Histories ?Past Medical History:  ?Diagnosis Date  ? Allergy   ? Anemia   ? Anxiety   ? Bipolar 1 disorder ([place])   ? Depression   ? Hypertension   ? ?Past Surgical History:  ?Procedure Laterality Date  ? NO PAST SURGERIES    ? ?Social History  ? ?Socioeconomic History  ? Marital status: Single  ?  Spouse name: Not on file  ?  Number of children: 1  ? Years of education: Not on file  ? Highest education level: Not on file  ?Occupational History  ? Occupation: Actor  ?Tobacco Use  ? Smoking status: Never  ? Smokeless tobacco: Never  ?Vaping Use  ? Vaping Use: Never used  ?Substance and Sexual Activity  ? Alcohol use: Yes  ?  Comment: once-twice a year  ? Drug use: No  ? Sexual activity: Yes  ?  Birth control/protection: Implant  ?Other Topics Concern  ? Not on file  ?Social History Narrative  ? Not on file  ? ?Social Determinants of Health  ? ?Financial Resource Strain: Not on file  ?Food Insecurity: Not on file  ?Transportation Needs: Not on file  ?Physical Activity: Not on file  ?Stress: Not on file  ?Social Connections: Not on file  ?Intimate Partner Violence: Not on file  ? ?Family History  ?Problem Relation Age of Onset  ? Hypertension Mother   ? Heart disease Mother   ? Other Father   ?     prediabetes  ? Hypertension Father   ? Heart disease Brother   ? Diabetes Paternal Grandmother   ? Colon cancer Neg Hx   ? Esophageal cancer Neg Hx   ? Inflammatory bowel disease Neg Hx   ? Liver disease Neg Hx   ? Pancreatic cancer Neg Hx   ? Rectal cancer Neg Hx   ? Stomach cancer Neg Hx   ? ?I have reviewed her medical, social, and family history in detail and updated the electronic medical record as necessary.  ? ? ?PHYSICAL EXAMINATION  ?BP 126/62   Pulse 69   Ht '4\' 11"'$  (1.499 m)   Wt 167 lb (75.8 kg)   BMI 33.73 kg/m?  ?Wt Readings from Last 3 Encounters:  ?07/28/21 167 lb (75.8 kg)  ?07/22/21 169 lb 12.8 oz (77 kg)  ?06/23/21 163 lb (73.9 kg)  ?GEN: NAD, appears stated age, doesn't appear chronically ill ?PSYCH: Cooperative, without pressured speech ?EYE: Conjunctivae pink, sclerae anicteric ?ENT: MMM ?CV: Nontachycardic ?RESP: No audible wheezing ?GI: NABS, soft, NT mildly protuberant, without rebound or guarding ?MSK/EXT: No lower extremity edema ?SKIN: No jaundice ?NEURO:  Alert & Oriented x 3, no focal  deficits ? ? ?REVIEW OF DATA  ?I reviewed the following data at the time of this encounter: ? ?GI Procedures and Studies  ?March 2023 EGD ?- No gross lesions in esophagus. Biopsied. ?- Z-line regular, 33 cm from the incisors. ?- 6 cm hiatal hernia. ?- Erythematous mucosa in the antrum. ?- No gross lesions in the stomach. Biopsied. ?- No gross lesions in the duodenal bulb, in the first portion of the duodenum and in the second ?portion of the duodenum. Biopsied. ? ?March 2023 colonoscopy ?- Hemorrhoids found on digital rectal exam. ?- One 6 mm polyp at the hepatic flexure, removed with a cold snare. Resected and retrieved. ?- Normal mucosa in the entire examined colon otherwise. ?- Non-bleeding non-thrombosed internal hemorrhoids. ? ?Pathology ?  Diagnosis ?1. Surgical [P], mid esophagus and distal esophagus ?- ESOPHAGEAL SQUAMOUS MUCOSA WITH NO SPECIFIC HISTOPATHOLOGIC CHANGES ?- NEGATIVE FOR INCREASED INTRAEPITHELIAL EOSINOPHILS ?2. Surgical [P], gastric antrum and gastric body ?- GASTRIC ANTRAL MUCOSA WITH MILD NONSPECIFIC REACTIVE GASTROPATHY ?- GASTRIC OXYNTIC MUCOSA WITH NO SPECIFIC HISTOPATHOLOGIC CHANGES ?- HELICOBACTER PYLORI-LIKE ORGANISMS ARE NOT IDENTIFIED ON ROUTINE H&E STAIN ?3. Surgical [P], duodenal bulb, 2nd portion of duodenum, and distal duodenum ?- DUODENAL MUCOSA WITH PATCHY INTRAEPITHELIAL LYMPHOCYTOSIS AND PRESERVED VILLOUS ?ARCHITECTURE. SEE NOTE ?4. Surgical [P], colon, hepatic flexure, polyp (1) ?- SESSILE SERRATED POLYP WITHOUT CYTOLOGIC DYSPLASIA ? ?Laboratory Studies  ?Those in epic ? ?Imaging Studies  ?April 2023 abdominal ultrasound right upper quadrant ?IMPRESSION: ?No acute abnormality noted. ? ? ?ASSESSMENT  ?Ms. Deisher is a 31 y.o. female with a pmh significant for bipolar disorder, MDD, anxiety, hypertension, GERD, large hiatal hernia, hemorrhoids, colon polyps (TA).  The patient is seen today for evaluation and management of: ? ?1. Hiatal hernia   ?2. Gastroesophageal reflux  disease without esophagitis   ?3. Bilious vomiting with nausea   ?4. Abdominal cramping   ?5. Hemorrhoids, unspecified hemorrhoid type   ?6. Rectal itching   ? ?The patient is hemodynamically stable.  Clinically however seems that her GERD s

## 2021-07-30 ENCOUNTER — Encounter: Payer: Self-pay | Admitting: Physician Assistant

## 2021-07-30 ENCOUNTER — Encounter: Payer: Self-pay | Admitting: Internal Medicine

## 2021-08-01 ENCOUNTER — Other Ambulatory Visit: Payer: Self-pay | Admitting: Physician Assistant

## 2021-08-01 MED ORDER — VITAMIN B-12 1000 MCG PO TABS: 1000.0000 ug | ORAL_TABLET | Freq: Every day | ORAL | 6 refills | Status: DC

## 2021-08-02 ENCOUNTER — Other Ambulatory Visit: Payer: Self-pay | Admitting: Physician Assistant

## 2021-08-02 ENCOUNTER — Telehealth: Payer: Self-pay | Admitting: Hematology and Oncology

## 2021-08-02 DIAGNOSIS — E538 Deficiency of other specified B group vitamins: Secondary | ICD-10-CM

## 2021-08-02 MED ORDER — VITAMIN D (ERGOCALCIFEROL) 1.25 MG (50000 UNIT) PO CAPS: 50000.0000 [IU] | ORAL_CAPSULE | ORAL | 0 refills | Status: DC

## 2021-08-02 NOTE — Telephone Encounter (Signed)
Scheduled per 4/18 secure chat, message has been left with pt  ?

## 2021-08-03 ENCOUNTER — Encounter: Payer: Self-pay | Admitting: Internal Medicine

## 2021-08-03 ENCOUNTER — Ambulatory Visit (INDEPENDENT_AMBULATORY_CARE_PROVIDER_SITE_OTHER): Payer: BC Managed Care – PPO | Admitting: Internal Medicine

## 2021-08-03 VITALS — BP 116/74 | HR 100 | Temp 98.6°F | Resp 16 | Ht 59.0 in | Wt 167.0 lb

## 2021-08-03 DIAGNOSIS — R350 Frequency of micturition: Secondary | ICD-10-CM

## 2021-08-03 DIAGNOSIS — E559 Vitamin D deficiency, unspecified: Secondary | ICD-10-CM

## 2021-08-03 DIAGNOSIS — Z8619 Personal history of other infectious and parasitic diseases: Secondary | ICD-10-CM

## 2021-08-03 DIAGNOSIS — D510 Vitamin B12 deficiency anemia due to intrinsic factor deficiency: Secondary | ICD-10-CM | POA: Insufficient documentation

## 2021-08-03 NOTE — Progress Notes (Signed)
? ?Subjective:  ?Patient ID: Joan Simpson, female    DOB: [birth year]  Age: 31 y.o. MRN: [id number] ? ?CC: Annual Exam and Anemia ? ? ?HPI ?Joan Simpson presents for a CPX and to establish. ? ?About 4 months ago she was treated for chlamydia.  She wants to do a test to see if the infection has resolved.  She denies vaginal discharge or bleeding.  She also complains of frequent urination and wants to know if she has a urinary tract infection. ? ?History ?Joan Simpson has a past medical history of Allergy, Anemia, Anxiety, Bipolar 1 disorder ([place]), Depression, and Hypertension.  ? ?She has a past surgical history that includes No past surgeries.  ? ?Her family history includes Diabetes in her brother, mother, and paternal grandmother; Heart disease in her brother and mother; Hypertension in her father and mother; Other in her father.She reports that she has never smoked. She has never been exposed to tobacco smoke. She has never used smokeless tobacco. She reports that she does not currently use alcohol. She reports that she does not use drugs. ? ?Outpatient Medications Prior to Visit  ?Medication Sig Dispense Refill  ? Armodafinil 250 MG tablet Take 250 mg by mouth daily.    ? buPROPion (WELLBUTRIN XL) 300 MG 24 hr tablet Take 300 mg by mouth daily.    ? dicyclomine (BENTYL) 20 MG tablet Take 2 tablets (40 mg total) by mouth as directed. Take 2-4 times daily as needed 60 tablet 3  ? lamoTRIgine (LAMICTAL) 200 MG tablet Take 200 mg by mouth daily.    ? LATUDA 40 MG TABS tablet Take 40 mg by mouth at bedtime.    ? metFORMIN (GLUCOPHAGE) 500 MG tablet Take 500 mg by mouth 2 (two) times daily.    ? naproxen sodium (ALEVE) 220 MG tablet Take 1 tablet (220 mg total) by mouth 2 (two) times daily as needed (headache, pain). 8 tablet 0  ? ondansetron (ZOFRAN-ODT) 8 MG disintegrating tablet Take 1 tablet (8 mg total) by mouth every 8 (eight) hours as needed for nausea or vomiting. 30 tablet 1  ? RABEprazole (ACIPHEX) 20 MG  tablet Take 1 tablet (20 mg total) by mouth 2 (two) times daily. 60 tablet 3  ? vitamin B-12 (CYANOCOBALAMIN) 1000 MCG tablet Take 1 tablet (1,000 mcg total) by mouth daily. 30 tablet 6  ? Vitamin D, Ergocalciferol, (DRISDOL) 1.25 MG (50000 UNIT) CAPS capsule Take 1 capsule (50,000 Units total) by mouth every 7 (seven) days. 5 capsule 0  ? ?No facility-administered medications prior to visit.  ? ? ?ROS ?Review of Systems  ?Constitutional: Negative.  Negative for chills, fatigue and fever.  ?HENT: Negative.    ?Eyes: Negative.   ?Respiratory:  Negative for cough, chest tightness, shortness of breath and wheezing.   ?Cardiovascular:  Negative for chest pain, palpitations and leg swelling.  ?Gastrointestinal:  Negative for abdominal pain, constipation, diarrhea, nausea and vomiting.  ?Endocrine: Negative.  Negative for polydipsia, polyphagia and polyuria.  ?Genitourinary:  Positive for frequency. Negative for hematuria, urgency, vaginal bleeding, vaginal discharge and vaginal pain.  ?Musculoskeletal: Negative.  Negative for arthralgias and myalgias.  ?Skin: Negative.   ?Neurological: Negative.  Negative for dizziness, weakness and light-headedness.  ?Hematological:  Negative for adenopathy. Does not bruise/bleed easily.  ?Psychiatric/Behavioral: Negative.    ? ?Objective:  ?BP 116/74 (BP Location: Right Arm, Patient Position: Sitting, Cuff Size: Large)   Pulse 100   Temp 98.6 ?F (37 ?C) (Oral)   Resp 16  Ht '4\' 11"'$  (1.499 m)   Wt 167 lb (75.8 kg)   LMP 07/27/2021 (Exact Date)   SpO2 97%   BMI 33.73 kg/m?  ? ?Physical Exam ?Vitals reviewed. Exam conducted with a chaperone present (Joan Simpson).  ?Constitutional:   ?   Appearance: She is not ill-appearing.  ?HENT:  ?   Nose: Nose normal.  ?   Mouth/Throat:  ?   Mouth: Mucous membranes are moist.  ?Eyes:  ?   General: No scleral icterus. ?   Conjunctiva/sclera: Conjunctivae normal.  ?Cardiovascular:  ?   Rate and Rhythm: Normal rate and regular rhythm.  ?   Heart  sounds: No murmur heard. ?Pulmonary:  ?   Effort: Pulmonary effort is normal.  ?   Breath sounds: No stridor. No wheezing, rhonchi or rales.  ?Abdominal:  ?   General: Abdomen is protuberant. There is no distension.  ?   Palpations: There is no hepatomegaly, splenomegaly or mass.  ?   Tenderness: There is no abdominal tenderness. There is no guarding.  ?   Hernia: No hernia is present. There is no hernia in the left inguinal area or right inguinal area.  ?Genitourinary: ?   Exam position: Supine.  ?   Pubic Area: No rash.   ?   Labia:     ?   Right: No rash, tenderness, lesion or injury.     ?   Left: No rash, tenderness, lesion or injury.   ?   Urethra: No urethral pain or urethral swelling.  ?   Vagina: Normal. No signs of injury and foreign body. No vaginal discharge, erythema, tenderness, bleeding, lesions or prolapsed vaginal walls.  ?   Cervix: No cervical motion tenderness, discharge, friability, lesion, erythema, cervical bleeding or eversion.  ?   Uterus: Normal.   ?   Adnexa: Right adnexa normal.    ?   Right: No mass, tenderness or fullness.      ?   Left: No mass, tenderness or fullness.    ?Musculoskeletal:     ?   General: Normal range of motion.  ?   Cervical back: Neck supple.  ?   Right lower leg: No edema.  ?   Left lower leg: No edema.  ?Lymphadenopathy:  ?   Cervical: No cervical adenopathy.  ?   Lower Body: No right inguinal adenopathy. No left inguinal adenopathy.  ?Skin: ?   General: Skin is warm and dry.  ?Neurological:  ?   General: No focal deficit present.  ?   Mental Status: She is alert. Mental status is at baseline.  ? ? ?Lab Results  ?Component Value Date  ? WBC 8.4 07/22/2021  ? HGB 11.7 (L) 07/22/2021  ? HCT 35.7 (L) 07/22/2021  ? PLT 355 07/22/2021  ? GLUCOSE 88 05/20/2021  ? CHOL 238 (A) 07/28/2021  ? TRIG 100 07/28/2021  ? HDL 56 07/28/2021  ? [place] 164 07/28/2021  ? ALT 17 05/20/2021  ? AST 18 07/28/2021  ? NA 140 07/28/2021  ? K 4.5 07/28/2021  ? CL 104 07/28/2021  ?  CREATININE 0.8 07/28/2021  ? BUN 8 07/28/2021  ? CO2 25 05/20/2021  ? TSH 1.15 06/09/2021  ? HGBA1C 4.9 07/28/2021  ?  ? ? ?Assessment & Plan:  ? ?Joan Simpson was seen today for annual exam and anemia. ? ?Diagnoses and all orders for this visit: ? ?Vitamin B12 deficiency anemia due to intrinsic factor deficiency ? ?Vitamin D deficiency disease ? ?History of chlamydia-  The infection has been adequately treated. ?-     GC/Chlamydia Probe Amp; Future ?-     GC/Chlamydia Probe Amp ? ?Frequent urination- Her urine culture is positive for contaminant but antibiotics are not indicated. ?-     CULTURE, URINE COMPREHENSIVE; Future ?-     Urinalysis, Routine w reflex microscopic; Future ?-     Urinalysis, Routine w reflex microscopic ?-     CULTURE, URINE COMPREHENSIVE ? ? ?I am having Joan Lowes "Gabi" maintain her naproxen sodium, lamoTRIgine, metFORMIN, Latuda, Armodafinil, buPROPion, ondansetron, dicyclomine, RABEprazole, vitamin B-12, and Vitamin D (Ergocalciferol). ? ?No orders of the defined types were placed in this encounter. ? ? ? ?Follow-up: Return in about 6 months (around 02/02/2022). ? ?Scarlette Calico, MD ?

## 2021-08-03 NOTE — Patient Instructions (Signed)

## 2021-08-04 ENCOUNTER — Inpatient Hospital Stay: Payer: BC Managed Care – PPO

## 2021-08-04 ENCOUNTER — Telehealth: Payer: Self-pay | Admitting: Internal Medicine

## 2021-08-04 ENCOUNTER — Other Ambulatory Visit: Payer: Self-pay

## 2021-08-04 DIAGNOSIS — E538 Deficiency of other specified B group vitamins: Secondary | ICD-10-CM

## 2021-08-04 DIAGNOSIS — D509 Iron deficiency anemia, unspecified: Secondary | ICD-10-CM | POA: Diagnosis not present

## 2021-08-04 MED ORDER — CYANOCOBALAMIN 1000 MCG/ML IJ SOLN
1000.0000 ug | Freq: Once | INTRAMUSCULAR | Status: AC
  Administered 2021-08-04: 1000 ug via INTRAMUSCULAR
  Filled 2021-08-04: qty 1

## 2021-08-04 NOTE — Patient Instructions (Signed)
Vitamin B12 Deficiency Vitamin B12 deficiency occurs when the body does not have enough of this important vitamin. The body needs this vitamin: To make red blood cells. To make DNA. This is the genetic material inside cells. To help the nerves work properly so they can carry messages from the brain to the body. Vitamin B12 deficiency can cause health problems, such as not having enough red blood cells in the blood (anemia). This can lead to nerve damage if untreated. What are the causes? This condition may be caused by: Not eating enough foods that contain vitamin B12. Not having enough stomach acid and digestive fluids to properly absorb vitamin B12 from the food that you eat. Having certain diseases that make it hard to absorb vitamin B12. These diseases include Crohn's disease, chronic pancreatitis, and cystic fibrosis. An autoimmune disorder in which the body does not make enough of a protein (intrinsic factor) within the stomach, resulting in not enough absorption of vitamin B12. Having a surgery in which part of the stomach or small intestine is removed. Taking certain medicines that make it hard for the body to absorb vitamin B12. These include: Heartburn medicines, such as antacids and proton pump inhibitors. Some medicines that are used to treat diabetes. What increases the risk? The following factors may make you more likely to develop a vitamin B12 deficiency: Being an older adult. Eating a vegetarian or vegan diet that does not include any foods that come from animals. Eating a poor diet while you are pregnant. Taking certain medicines. Having alcoholism. What are the signs or symptoms? In some cases, there are no symptoms of this condition. If the condition leads to anemia or nerve damage, various symptoms may occur, such as: Weakness. Tiredness (fatigue). Loss of appetite. Numbness or tingling in your hands and feet. Redness and burning of the tongue. Depression,  confusion, or memory problems. Trouble walking. If anemia is severe, symptoms can include: Shortness of breath. Dizziness. Rapid heart rate. How is this diagnosed? This condition may be diagnosed with a blood test to measure the level of vitamin B12 in your blood. You may also have other tests, including: A group of tests that measure certain characteristics of blood cells (complete blood count, CBC). A blood test to measure intrinsic factor. A procedure where a thin tube with a camera on the end is used to look into your stomach or intestines (endoscopy). Other tests may be needed to discover the cause of the deficiency. How is this treated? Treatment for this condition depends on the cause. This condition may be treated by: Changing your eating and drinking habits, such as: Eating more foods that contain vitamin B12. Drinking less alcohol or no alcohol. Getting vitamin B12 injections. Taking vitamin B12 supplements by mouth (orally). Your health care provider will tell you which dose is best for you. Follow these instructions at home: Eating and drinking  Include foods in your diet that come from animals and contain a lot of vitamin B12. These include: Meats and poultry. This includes beef, pork, chicken, turkey, and organ meats, such as liver. Seafood. This includes clams, rainbow trout, salmon, tuna, and haddock. Eggs. Dairy foods such as milk, yogurt, and cheese. Eat foods that have vitamin B12 added to them (are fortified), such as ready-to-eat breakfast cereals. Check the label on the package to see if a food is fortified. The items listed above may not be a complete list of foods and beverages you can eat and drink. Contact a dietitian for   more information. Alcohol use Do not drink alcohol if: Your health care provider tells you not to drink. You are pregnant, may be pregnant, or are planning to become pregnant. If you drink alcohol: Limit how much you have to: 0-1 drink a  day for women. 0-2 drinks a day for men. Know how much alcohol is in your drink. In the [place] one drink equals one 12 oz bottle of beer (355 mL), one 5 oz glass of wine (148 mL), or one 1 oz glass of hard liquor (44 mL). General instructions Get vitamin B12 injections if told to by your health care provider. Take supplements only as told by your health care provider. Follow the directions carefully. Keep all follow-up visits. This is important. Contact a health care provider if: Your symptoms come back. Your symptoms get worse or do not improve with treatment. Get help right away: You develop shortness of breath. You have a rapid heart rate. You have chest pain. You become dizzy or you faint. These symptoms may be an emergency. Get help right away. Call 911. Do not wait to see if the symptoms will go away. Do not drive yourself to the hospital. Summary Vitamin B12 deficiency occurs when the body does not have enough of this important vitamin. Common causes include not eating enough foods that contain vitamin B12, not being able to absorb vitamin B12 from the food that you eat, having a surgery in which part of the stomach or small intestine is removed, or taking certain medicines. Eat foods that have vitamin B12 in them. Treatment may include making a change in the way you eat and drink, getting vitamin B12 injections, or taking vitamin B12 supplements. This information is not intended to replace advice given to you by your health care provider. Make sure you discuss any questions you have with your health care provider. Document Revised: 11/26/2020 Document Reviewed: 11/26/2020 Elsevier Patient Education  2023 [employer] Inc[employer]  

## 2021-08-04 NOTE — Telephone Encounter (Signed)
PT calls today wishing to discuss lab results! She said it wasn't showing up on her MyChart and atleast on our end it looks as though they had been released. I had let her know we could give her a call tomorrow to discuss what we have on our end. ? ?CB: [phone] ?

## 2021-08-05 ENCOUNTER — Telehealth: Payer: Self-pay | Admitting: Internal Medicine

## 2021-08-05 LAB — CULTURE, URINE COMPREHENSIVE

## 2021-08-05 NOTE — Telephone Encounter (Signed)
Pt checking status of 08-03-2021 lab results ? ?Results has not been resulted by provider as of yet ? ?Pt requesting result notes sent to her mychart ?

## 2021-08-07 LAB — GC/CHLAMYDIA PROBE AMP
Chlamydia trachomatis, NAA: NEGATIVE
Neisseria Gonorrhoeae by PCR: NEGATIVE

## 2021-08-08 ENCOUNTER — Encounter: Payer: Self-pay | Admitting: Internal Medicine

## 2021-08-11 ENCOUNTER — Inpatient Hospital Stay: Payer: BC Managed Care – PPO

## 2021-08-11 ENCOUNTER — Other Ambulatory Visit: Payer: Self-pay

## 2021-08-11 DIAGNOSIS — E538 Deficiency of other specified B group vitamins: Secondary | ICD-10-CM

## 2021-08-11 DIAGNOSIS — D509 Iron deficiency anemia, unspecified: Secondary | ICD-10-CM | POA: Diagnosis not present

## 2021-08-11 MED ORDER — CYANOCOBALAMIN 1000 MCG/ML IJ SOLN
1000.0000 ug | Freq: Once | INTRAMUSCULAR | Status: AC
  Administered 2021-08-11: 1000 ug via INTRAMUSCULAR
  Filled 2021-08-11: qty 1

## 2021-08-11 MED ORDER — CYANOCOBALAMIN 1000 MCG/ML IJ SOLN: 1000.0000 ug | Freq: Once | INTRAMUSCULAR | Status: DC

## 2021-08-18 ENCOUNTER — Inpatient Hospital Stay: Payer: BC Managed Care – PPO

## 2021-08-25 ENCOUNTER — Other Ambulatory Visit: Payer: Self-pay

## 2021-08-25 ENCOUNTER — Inpatient Hospital Stay: Payer: BC Managed Care – PPO | Attending: Physician Assistant

## 2021-08-25 DIAGNOSIS — E538 Deficiency of other specified B group vitamins: Secondary | ICD-10-CM | POA: Diagnosis present

## 2021-08-25 MED ORDER — CYANOCOBALAMIN 1000 MCG/ML IJ SOLN
1000.0000 ug | Freq: Once | INTRAMUSCULAR | Status: AC
  Administered 2021-08-25: 1000 ug via INTRAMUSCULAR
  Filled 2021-08-25: qty 1

## 2021-09-01 ENCOUNTER — Inpatient Hospital Stay: Payer: BC Managed Care – PPO

## 2021-09-01 ENCOUNTER — Other Ambulatory Visit: Payer: Self-pay

## 2021-09-01 DIAGNOSIS — E538 Deficiency of other specified B group vitamins: Secondary | ICD-10-CM

## 2021-09-01 MED ORDER — CYANOCOBALAMIN 1000 MCG/ML IJ SOLN
1000.0000 ug | Freq: Once | INTRAMUSCULAR | Status: AC
  Administered 2021-09-01: 1000 ug via INTRAMUSCULAR
  Filled 2021-09-01: qty 1

## 2021-09-01 NOTE — Patient Instructions (Signed)
Vitamin B12 Deficiency Vitamin B12 deficiency occurs when the body does not have enough of this important vitamin. The body needs this vitamin: To make red blood cells. To make DNA. This is the genetic material inside cells. To help the nerves work properly so they can carry messages from the brain to the body. Vitamin B12 deficiency can cause health problems, such as not having enough red blood cells in the blood (anemia). This can lead to nerve damage if untreated. What are the causes? This condition may be caused by: Not eating enough foods that contain vitamin B12. Not having enough stomach acid and digestive fluids to properly absorb vitamin B12 from the food that you eat. Having certain diseases that make it hard to absorb vitamin B12. These diseases include Crohn's disease, chronic pancreatitis, and cystic fibrosis. An autoimmune disorder in which the body does not make enough of a protein (intrinsic factor) within the stomach, resulting in not enough absorption of vitamin B12. Having a surgery in which part of the stomach or small intestine is removed. Taking certain medicines that make it hard for the body to absorb vitamin B12. These include: Heartburn medicines, such as antacids and proton pump inhibitors. Some medicines that are used to treat diabetes. What increases the risk? The following factors may make you more likely to develop a vitamin B12 deficiency: Being an older adult. Eating a vegetarian or vegan diet that does not include any foods that come from animals. Eating a poor diet while you are pregnant. Taking certain medicines. Having alcoholism. What are the signs or symptoms? In some cases, there are no symptoms of this condition. If the condition leads to anemia or nerve damage, various symptoms may occur, such as: Weakness. Tiredness (fatigue). Loss of appetite. Numbness or tingling in your hands and feet. Redness and burning of the tongue. Depression,  confusion, or memory problems. Trouble walking. If anemia is severe, symptoms can include: Shortness of breath. Dizziness. Rapid heart rate. How is this diagnosed? This condition may be diagnosed with a blood test to measure the level of vitamin B12 in your blood. You may also have other tests, including: A group of tests that measure certain characteristics of blood cells (complete blood count, CBC). A blood test to measure intrinsic factor. A procedure where a thin tube with a camera on the end is used to look into your stomach or intestines (endoscopy). Other tests may be needed to discover the cause of the deficiency. How is this treated? Treatment for this condition depends on the cause. This condition may be treated by: Changing your eating and drinking habits, such as: Eating more foods that contain vitamin B12. Drinking less alcohol or no alcohol. Getting vitamin B12 injections. Taking vitamin B12 supplements by mouth (orally). Your health care provider will tell you which dose is best for you. Follow these instructions at home: Eating and drinking  Include foods in your diet that come from animals and contain a lot of vitamin B12. These include: Meats and poultry. This includes beef, pork, chicken, turkey, and organ meats, such as liver. Seafood. This includes clams, rainbow trout, salmon, tuna, and haddock. Eggs. Dairy foods such as milk, yogurt, and cheese. Eat foods that have vitamin B12 added to them (are fortified), such as ready-to-eat breakfast cereals. Check the label on the package to see if a food is fortified. The items listed above may not be a complete list of foods and beverages you can eat and drink. Contact a dietitian for   more information. Alcohol use Do not drink alcohol if: Your health care provider tells you not to drink. You are pregnant, may be pregnant, or are planning to become pregnant. If you drink alcohol: Limit how much you have to: 0-1 drink a  day for women. 0-2 drinks a day for men. Know how much alcohol is in your drink. In the [place] one drink equals one 12 oz bottle of beer (355 mL), one 5 oz glass of wine (148 mL), or one 1 oz glass of hard liquor (44 mL). General instructions Get vitamin B12 injections if told to by your health care provider. Take supplements only as told by your health care provider. Follow the directions carefully. Keep all follow-up visits. This is important. Contact a health care provider if: Your symptoms come back. Your symptoms get worse or do not improve with treatment. Get help right away: You develop shortness of breath. You have a rapid heart rate. You have chest pain. You become dizzy or you faint. These symptoms may be an emergency. Get help right away. Call 911. Do not wait to see if the symptoms will go away. Do not drive yourself to the hospital. Summary Vitamin B12 deficiency occurs when the body does not have enough of this important vitamin. Common causes include not eating enough foods that contain vitamin B12, not being able to absorb vitamin B12 from the food that you eat, having a surgery in which part of the stomach or small intestine is removed, or taking certain medicines. Eat foods that have vitamin B12 in them. Treatment may include making a change in the way you eat and drink, getting vitamin B12 injections, or taking vitamin B12 supplements. This information is not intended to replace advice given to you by your health care provider. Make sure you discuss any questions you have with your health care provider. Document Revised: 11/26/2020 Document Reviewed: 11/26/2020 Elsevier Patient Education  2023 [employer] Inc[employer]  

## 2021-09-07 ENCOUNTER — Encounter: Payer: Self-pay | Admitting: Physician Assistant

## 2021-09-07 ENCOUNTER — Other Ambulatory Visit: Payer: Self-pay | Admitting: Physician Assistant

## 2021-09-21 ENCOUNTER — Other Ambulatory Visit: Payer: Self-pay | Admitting: General Surgery

## 2021-09-21 DIAGNOSIS — K449 Diaphragmatic hernia without obstruction or gangrene: Secondary | ICD-10-CM

## 2021-09-22 ENCOUNTER — Inpatient Hospital Stay: Payer: BC Managed Care – PPO | Attending: Physician Assistant

## 2021-09-22 DIAGNOSIS — E538 Deficiency of other specified B group vitamins: Secondary | ICD-10-CM | POA: Insufficient documentation

## 2021-09-22 MED ORDER — CYANOCOBALAMIN 1000 MCG/ML IJ SOLN
1000.0000 ug | Freq: Once | INTRAMUSCULAR | Status: AC
  Administered 2021-09-22: 1000 ug via INTRAMUSCULAR
  Filled 2021-09-22: qty 1

## 2021-09-22 NOTE — Progress Notes (Signed)
Per Dede Query, PA, OK to proceed with B-12 injection today.

## 2021-10-01 ENCOUNTER — Other Ambulatory Visit: Payer: Self-pay | Admitting: Physician Assistant

## 2021-10-04 ENCOUNTER — Ambulatory Visit
Admission: RE | Admit: 2021-10-04 | Discharge: 2021-10-04 | Disposition: A | Discharge status: Home / Self Care | Payer: BC Managed Care – PPO | Source: Ambulatory Visit | Attending: General Surgery | Admitting: General Surgery

## 2021-10-04 DIAGNOSIS — K449 Diaphragmatic hernia without obstruction or gangrene: Secondary | ICD-10-CM

## 2021-11-03 ENCOUNTER — Inpatient Hospital Stay: Payer: BC Managed Care – PPO

## 2021-11-03 ENCOUNTER — Inpatient Hospital Stay: Payer: BC Managed Care – PPO | Attending: Physician Assistant | Admitting: Hematology and Oncology

## 2021-11-03 VITALS — BP 109/64 | HR 92 | Temp 97.8°F | Resp 16 | Ht 59.0 in | Wt 166.4 lb

## 2021-11-03 DIAGNOSIS — D75839 Thrombocytosis, unspecified: Secondary | ICD-10-CM | POA: Insufficient documentation

## 2021-11-03 DIAGNOSIS — E538 Deficiency of other specified B group vitamins: Secondary | ICD-10-CM | POA: Diagnosis not present

## 2021-11-03 DIAGNOSIS — D509 Iron deficiency anemia, unspecified: Secondary | ICD-10-CM | POA: Insufficient documentation

## 2021-11-03 LAB — CMP (CANCER CENTER ONLY)
ALT: 20 U/L (ref 0–44)
AST: 15 U/L (ref 15–41)
Albumin: 4.4 g/dL (ref 3.5–5.0)
Alkaline Phosphatase: 66 U/L (ref 38–126)
Anion gap: 8 (ref 5–15)
BUN: 11 mg/dL (ref 6–20)
CO2: 22 mmol/L (ref 22–32)
Calcium: 9.4 mg/dL (ref 8.9–10.3)
Chloride: 108 mmol/L (ref 98–111)
Creatinine: 0.76 mg/dL (ref 0.44–1.00)
GFR, Estimated: >60 mL/min (ref >60)
Glucose, Bld: 100 mg/dL — ABNORMAL HIGH (ref 70–99)
Potassium: 3.8 mmol/L (ref 3.5–5.1)
Sodium: 138 mmol/L (ref 135–145)
Total Bilirubin: 0.3 mg/dL (ref 0.3–1.2)
Total Protein: 7.5 g/dL (ref 6.5–8.1)

## 2021-11-03 LAB — CBC WITH DIFFERENTIAL (CANCER CENTER ONLY)
Abs Immature Granulocytes: 0.03 10*3/uL (ref 0.00–0.07)
Basophils Absolute: 0 10*3/uL (ref 0.0–0.1)
Basophils Relative: 0 %
Eosinophils Absolute: 0.1 10*3/uL (ref 0.0–0.5)
Eosinophils Relative: 0 %
HCT: 36.8 % (ref 36.0–46.0)
Hemoglobin: 12.9 g/dL (ref 12.0–15.0)
Immature Granulocytes: 0 %
Lymphocytes Relative: 26 %
Lymphs Abs: 2.8 10*3/uL (ref 0.7–4.0)
MCH: 31.1 pg (ref 26.0–34.0)
MCHC: 35.1 g/dL (ref 30.0–36.0)
MCV: 88.7 fL (ref 80.0–100.0)
Monocytes Absolute: 0.6 10*3/uL (ref 0.1–1.0)
Monocytes Relative: 6 %
Neutro Abs: 7.6 10*3/uL (ref 1.7–7.7)
Neutrophils Relative %: 68 %
Platelet Count: 398 10*3/uL (ref 150–400)
RBC: 4.15 MIL/uL (ref 3.87–5.11)
RDW: 13.1 % (ref 11.5–15.5)
WBC Count: 11.1 10*3/uL — ABNORMAL HIGH (ref 4.0–10.5)
nRBC: 0 % (ref 0.0–0.2)

## 2021-11-03 LAB — IRON AND IRON BINDING CAPACITY (CC-WL,HP ONLY)
Iron: 36 ug/dL (ref 28–170)
Saturation Ratios: 11 % (ref 10.4–31.8)
TIBC: 322 ug/dL (ref 250–450)
UIBC: 286 ug/dL (ref 148–442)

## 2021-11-03 MED ORDER — CYANOCOBALAMIN 1000 MCG/ML IJ SOLN
1000.0000 ug | Freq: Once | INTRAMUSCULAR | Status: AC
  Administered 2021-11-03: 1000 ug via INTRAMUSCULAR
  Filled 2021-11-03: qty 1

## 2021-11-03 NOTE — Patient Instructions (Signed)
Vitamin B12 Injection What is this medication? Vitamin B12 (VAHY tuh min B12) prevents and treats low vitamin B12 levels in your body. It is used in people who do not get enough vitamin B12 from their diet or when their digestive tract does not absorb enough. Vitamin B12 plays an important role in maintaining the health of your nervous system and red blood cells. This medicine may be used for other purposes; ask your health care provider or pharmacist if you have questions. COMMON BRAND NAME(S): B-12 Compliance Kit, B-12 Injection Kit, Cyomin, Dodex, LA-12, Nutri-Twelve, Physicians EZ Use B-12, Primabalt What should I tell my care team before I take this medication? They need to know if you have any of these conditions: Kidney disease Leber's disease Megaloblastic anemia An unusual or allergic reaction to cyanocobalamin, cobalt, other medications, foods, dyes, or preservatives Pregnant or trying to get pregnant Breast-feeding How should I use this medication? This medication is injected into a muscle or deeply under the skin. It is usually given in a clinic or care team's office. However, your care team may teach you how to inject yourself. Follow all instructions. Talk to your care team about the use of this medication in children. Special care may be needed. Overdosage: If you think you have taken too much of this medicine contact a poison control center or emergency room at once. NOTE: This medicine is only for you. Do not share this medicine with others. What if I miss a dose? If you are given your dose at a clinic or care team's office, call to reschedule your appointment. If you give your own injections, and you miss a dose, take it as soon as you can. If it is almost time for your next dose, take only that dose. Do not take double or extra doses. What may interact with this medication? Alcohol Colchicine This list may not describe all possible interactions. Give your health care  provider a list of all the medicines, herbs, non-prescription drugs, or dietary supplements you use. Also tell them if you smoke, drink alcohol, or use illegal drugs. Some items may interact with your medicine. What should I watch for while using this medication? Visit your care team regularly. You may need blood work done while you are taking this medication. You may need to follow a special diet. Talk to your care team. Limit your alcohol intake and avoid smoking to get the best benefit. What side effects may I notice from receiving this medication? Side effects that you should report to your care team as soon as possible: Allergic reactions--skin rash, itching, hives, swelling of the face, lips, tongue, or throat Swelling of the ankles, hands, or feet Trouble breathing Side effects that usually do not require medical attention (report to your care team if they continue or are bothersome): Diarrhea This list may not describe all possible side effects. Call your doctor for medical advice about side effects. You may report side effects to FDA at 1-800-FDA-1088. Where should I keep my medication? Keep out of the reach of children. Store at room temperature between 15 and 30 degrees C (59 and 85 degrees F). Protect from light. Throw away any unused medication after the expiration date. NOTE: This sheet is a summary. It may not cover all possible information. If you have questions about this medicine, talk to your doctor, pharmacist, or health care provider.  2023 Elsevier/Gold Standard (2020-12-14 00:00:00)

## 2021-11-03 NOTE — Progress Notes (Signed)
[place] [phone]   [phone]  PROGRESS NOTE  Patient Care Team: Janith Lima, MD as PCP - General (Internal Medicine)  CHIEF COMPLAINTS/PURPOSE OF CONSULTATION:  Iron deficiency anemia with thrombocytosis.  Vitamin b12 deficiency   HISTORY OF PRESENTING ILLNESS:  Joan Simpson 31 y.o. female  returns for a follow up for iron deficiency anemia. She was last seen in clinic on 05/20/2021. In the interim, she continued Vitamin b12 shots.   At today's visit, Ms. Vultaggio reports she has upcoming procedures for her hiatal hernia and esophagus evaluation in August 2023.  She reports that she has felt "exhausted the past week" and needs a lot more sleep.  She reports other than that she has felt overall well.  She reports that she is not currently taking vitamin B12 pills and has been getting the IM injections.  She notes that her menstrual cycles are nonexistent as she is currently on Nexplanon.  She said no other bleeding from any other sources.  Her appetite has been good and her weight has been steady. Patient denies fevers, chills,night sweats, shortness of breath, chest pain or cough. She has no other complaints. Rest of the 10 point ROS is below.   MEDICAL HISTORY:  Past Medical History:  Diagnosis Date   Allergy    Anemia    Anxiety    Bipolar 1 disorder ([place])    Depression    Hypertension     SURGICAL HISTORY: Past Surgical History:  Procedure Laterality Date   NO PAST SURGERIES      SOCIAL HISTORY: Social History   Socioeconomic History   Marital status: Single    Spouse name: Not on file   Number of children: 1   Years of education: Not on file   Highest education level: Not on file  Occupational History   Occupation: Actor  Tobacco Use   Smoking status: Never    Passive exposure: Never   Smokeless tobacco: Never  Vaping Use   Vaping Use: Never used  Substance and Sexual Activity   Alcohol use: Not  Currently    Comment: once-twice a year   Drug use: No   Sexual activity: Yes    Partners: Male    Birth control/protection: Implant  Other Topics Concern   Not on file  Social History Narrative   Not on file   Social Determinants of Health   Financial Resource Strain: Not on file  Food Insecurity: Not on file  Transportation Needs: Not on file  Physical Activity: Not on file  Stress: Not on file  Social Connections: Not on file  Intimate Partner Violence: Not on file    FAMILY HISTORY: Family History  Problem Relation Age of Onset   Diabetes Mother    Hypertension Mother    Heart disease Mother    Other Father        prediabetes   Hypertension Father    Diabetes Brother    Heart disease Brother    Diabetes Paternal Grandmother    Colon cancer Neg Hx    Esophageal cancer Neg Hx    Inflammatory bowel disease Neg Hx    Liver disease Neg Hx    Pancreatic cancer Neg Hx    Rectal cancer Neg Hx    Stomach cancer Neg Hx     ALLERGIES:  is allergic to erythromycin.  MEDICATIONS:  Current Outpatient Medications  Medication Sig Dispense Refill   Armodafinil 250 MG tablet Take 250 mg  by mouth daily.     buPROPion (WELLBUTRIN XL) 300 MG 24 hr tablet Take 300 mg by mouth daily.     dicyclomine (BENTYL) 20 MG tablet Take 2 tablets (40 mg total) by mouth as directed. Take 2-4 times daily as needed 60 tablet 3   lamoTRIgine (LAMICTAL) 200 MG tablet Take 200 mg by mouth daily.     LATUDA 40 MG TABS tablet Take 40 mg by mouth at bedtime.     metFORMIN (GLUCOPHAGE) 500 MG tablet Take 500 mg by mouth 2 (two) times daily.     naproxen sodium (ALEVE) 220 MG tablet Take 1 tablet (220 mg total) by mouth 2 (two) times daily as needed (headache, pain). 8 tablet 0   ondansetron (ZOFRAN-ODT) 8 MG disintegrating tablet Take 1 tablet (8 mg total) by mouth every 8 (eight) hours as needed for nausea or vomiting. 30 tablet 1   RABEprazole (ACIPHEX) 20 MG tablet Take 1 tablet (20 mg total) by  mouth 2 (two) times daily. 60 tablet 3   vitamin B-12 (CYANOCOBALAMIN) 1000 MCG tablet Take 1 tablet (1,000 mcg total) by mouth daily. 30 tablet 6   Vitamin D, Ergocalciferol, (DRISDOL) 1.25 MG (50000 UNIT) CAPS capsule take 1 capsule by mouth every 7 days 5 capsule 0   No current facility-administered medications for this visit.    REVIEW OF SYSTEMS:   Constitutional: ( - ) fevers, ( - )  chills , ( - ) night sweats Eyes: ( - ) blurriness of vision, ( - ) double vision, ( - ) watery eyes Ears, nose, mouth, throat, and face: ( - ) mucositis, ( - ) sore throat Respiratory: ( - ) cough, ( - ) dyspnea, ( - ) wheezes Cardiovascular: ( - ) palpitation, ( - ) chest discomfort, ( - ) lower extremity swelling Gastrointestinal:  ( +) nausea, ( + ) heartburn, ( +) change in bowel habits Skin: ( - ) abnormal skin rashes Lymphatics: ( - ) new lymphadenopathy, ( - ) easy bruising Neurological: ( +) numbness, ( - ) tingling, ( - ) new weaknesses Behavioral/Psych: ( - ) mood change, ( - ) new changes  All other systems were reviewed with the patient and are negative.  PHYSICAL EXAMINATION: ECOG PERFORMANCE STATUS: 1 - Symptomatic but completely ambulatory  Vitals:   11/03/21 1529  BP: 109/64  Pulse: 92  Resp: 16  Temp: 97.8 F (36.6 C)  SpO2: 98%   Filed Weights   11/03/21 1529  Weight: 166 lb 6.4 oz (75.5 kg)    GENERAL: well appearing female in NAD  SKIN: skin color, texture, turgor are normal, no rashes or significant lesions EYES: conjunctiva are pink and non-injected, sclera clear NECK: supple, non-tender LYMPH:  no palpable lymphadenopathy in the cervical or supraclavicular lymph nodes.  LUNGS: clear to auscultation and percussion with normal breathing effort HEART: regular rate & rhythm and no murmurs and no lower extremity edema Musculoskeletal: no cyanosis of digits and no clubbing  PSYCH: alert & oriented x 3, fluent speech NEURO: no focal motor/sensory deficits  LABORATORY  DATA:  I have reviewed the data as listed    Latest Ref Rng & Units 11/03/2021    2:59 PM 07/22/2021    8:07 AM 05/20/2021    9:48 AM  CBC  WBC 4.0 - 10.5 K/uL 11.1  8.4  6.3   Hemoglobin 12.0 - 15.0 g/dL 12.9  11.7  8.8   Hematocrit 36.0 - 46.0 % 36.8  35.7  28.4   Platelets 150 - 400 K/uL 398  355  521        Latest Ref Rng & Units 11/03/2021    2:59 PM 07/28/2021   12:00 AM 05/20/2021    9:48 AM  CMP  Glucose 70 - 99 mg/dL 100   88   BUN 6 - 20 mg/dL '11  8     9   '$ Creatinine 0.44 - 1.00 mg/dL 0.76  0.8     0.75   Sodium 135 - 145 mmol/L 138  140     140   Potassium 3.5 - 5.1 mmol/L 3.8  4.5     3.8   Chloride 98 - 111 mmol/L 108  104     107   CO2 22 - 32 mmol/L 22   25   Calcium 8.9 - 10.3 mg/dL 9.4  10.2     9.5   Total Protein 6.5 - 8.1 g/dL 7.5   7.7   Total Bilirubin 0.3 - 1.2 mg/dL 0.3   0.3   Alkaline Phos 38 - 126 U/L 66  75     65   AST 15 - 41 U/L '15  18     15   '$ ALT 0 - 44 U/L 20   17      This result is from an external source.    ASSESSMENT & PLAN Ruble Buttler is a 31 y.o. female who returns for a follow up for iron deficiency anemia and thrombocytosis.    #Iron deficiency anemia/thrombocytosis: #vitamin b12 deficiency  --Unlikely secondary to GYN bleeding since patient reports light menstrual cycles while on birth control --Patient underwent EGD/colonoscopy on 06/23/2021 with Dr. Rush Landmark without any evidence of active/recent bleeding or source of IDA --Could be secondary to malabsorption due to PPI use  --Patient received IV venofer 200 mg once a week x 5 doses from 05/24/2021-06/21/2021. --Labs today show anemia has markedly improved from 8.8 to 12.9. Platelet count has normalized to 398K. Iron panel shows no evidence deficiency. --Additional IV iron is not needed at this time.  --continue monthly vitamin b12 injections.  --RTC in 3 months with repeat labs and interval monthly vitamin b12 shots.   #Fatigue: --Energy levels are currently  improving. --Since anemia has improved and patient continues to feel fatigued, I advised patient to follow up with PCP to evaluate for other causes.   #GI symptoms: --Patient is under the care of Dr. Rush Landmark. --She is taking Aciphex 20 mg BID for acid reflux --She is taking zofran 8 mg q 8 hours for nausea/vomiting --She is taking bentyl 40 mg PRN for abdominal cramping.  -- Esophageal manometry scheduled for 11/23/2021 and hiatal hernia repair with fundoplication on 2/63/7858. --Advised to try OTC senna or miralax for the constipation.   No orders of the defined types were placed in this encounter.   All questions were answered. The patient knows to call the clinic with any problems, questions or concerns.  I have spent a total of 30 minutes minutes of face-to-face and non-face-to-face time, preparing to see the patient, performing a medically appropriate examination, counseling and educating the patient, ordering tests, documenting clinical information in the electronic health record, and care coordination.   Dede Query, PA-C Department of Hematology/Oncology [place] [facility] Phone: [phone]

## 2021-11-04 ENCOUNTER — Other Ambulatory Visit: Payer: Self-pay | Admitting: Internal Medicine

## 2021-11-04 LAB — FERRITIN: Ferritin: 68 ng/mL (ref 11–307)

## 2021-11-06 ENCOUNTER — Encounter: Payer: Self-pay | Admitting: Physician Assistant

## 2021-11-07 ENCOUNTER — Telehealth: Payer: Self-pay | Admitting: Hematology and Oncology

## 2021-11-07 NOTE — Telephone Encounter (Signed)
Scheduled per 7/22 in basket, pt has been calle d[place] confirmed

## 2021-11-14 NOTE — Progress Notes (Signed)
Surgery orders requested via Epic inbox. °

## 2021-11-16 ENCOUNTER — Encounter (HOSPITAL_COMMUNITY): Payer: Self-pay | Admitting: Gastroenterology

## 2021-11-16 NOTE — Progress Notes (Signed)
Attempted to obtain medical history via telephone, unable to reach at this time. HIPAA compliant voicemail message left requesting return call to pre surgical testing department. 

## 2021-11-18 NOTE — Patient Instructions (Signed)
DUE TO COVID-19 ONLY TWO VISITORS  (aged 30 and older)  ARE ALLOWED TO COME WITH YOU AND STAY IN THE WAITING ROOM ONLY DURING PRE OP AND PROCEDURE.   **NO VISITORS ARE ALLOWED IN THE SHORT STAY AREA OR RECOVERY ROOM!!**  IF YOU WILL BE ADMITTED INTO THE HOSPITAL YOU ARE ALLOWED ONLY FOUR SUPPORT PEOPLE DURING VISITATION HOURS ONLY (7 AM -8PM)   The support person(s) must pass our screening, gel in and out, and wear a mask at all times, including in the patient's room. Patients must also wear a mask when staff or their support person are in the room. Visitors GUEST BADGE MUST BE WORN VISIBLY  One adult visitor may remain with you overnight and MUST be in the room by 8 P.M.     Your procedure is scheduled on: 12/12/21   Report to [facility] Main Entrance    Report to admitting at: 7:45 AM   Call this number if you have problems the morning of surgery [phone]   Eat a light diet the day before surgery.  Examples including soups, broths, toast, yogurt, mashed potatoes.  Things to avoid include carbonated beverages (fizzy beverages), raw fruits and raw vegetables, or beans.   If your bowels are filled with gas, your surgeon will have difficulty visualizing your pelvic organs which increases your surgical risks.   Do not eat food :After Midnight.   After Midnight you may have the following liquids until : 6:30 AM DAY OF SURGERY  Water Black Coffee (sugar ok, NO MILK/CREAM OR CREAMERS)  Tea (sugar ok, NO MILK/CREAM OR CREAMERS) regular and decaf                             Plain Jell-O (NO RED)                                           Fruit ices (not with fruit pulp, NO RED)                                     Popsicles (NO RED)                                                                  Juice: apple, WHITE grape, WHITE cranberry Sports drinks like Gatorade (NO RED)              FOLLOW BOWEL PREP AND ANY ADDITIONAL PRE OP INSTRUCTIONS YOU RECEIVED FROM YOUR SURGEON'S  OFFICE!!!   Oral Hygiene is also important to reduce your risk of infection.                                    Remember - BRUSH YOUR TEETH THE MORNING OF SURGERY WITH YOUR REGULAR TOOTHPASTE   Do NOT smoke after Midnight   Take these medicines the morning of surgery with A SIP OF WATER: lamotrigine,bupropion,bentyl.  DO NOT TAKE ANY ORAL DIABETIC MEDICATIONS DAY OF YOUR  SURGERY  Bring CPAP mask and tubing day of surgery.                              You may not have any metal on your body including hair pins, jewelry, and body piercing             Do not wear make-up, lotions, powders, perfumes/cologne, or deodorant  Do not wear nail polish including gel and S&S, artificial/acrylic nails, or any other type of covering on natural nails including finger and toenails. If you have artificial nails, gel coating, etc. that needs to be removed by a nail salon please have this removed prior to surgery or surgery may need to be canceled/ delayed if the surgeon/ anesthesia feels like they are unable to be safely monitored.   Do not shave  48 hours prior to surgery.    Do not bring valuables to the hospital. [place].   Contacts, dentures or bridgework may not be worn into surgery.   Bring small overnight bag day of surgery.   DO NOT [place]. PHARMACY WILL DISPENSE MEDICATIONS LISTED ON YOUR MEDICATION LIST TO YOU DURING YOUR ADMISSION [place]!    Patients discharged on the day of surgery will not be allowed to drive home.  Someone NEEDS to stay with you for the first 24 hours after anesthesia.   Special Instructions: Bring a copy of your healthcare power of attorney and living will documents         the day of surgery if you haven't scanned them before.              Please read over the following fact sheets you were given: IF YOU HAVE QUESTIONS ABOUT YOUR PRE-OP INSTRUCTIONS PLEASE CALL  [phone]     [facility] - Preparing for Surgery Before surgery, you can play an important role.  Because skin is not sterile, your skin needs to be as free of germs as possible.  You can reduce the number of germs on your skin by washing with CHG (chlorahexidine gluconate) soap before surgery.  CHG is an antiseptic cleaner which kills germs and bonds with the skin to continue killing germs even after washing. Please DO NOT use if you have an allergy to CHG or antibacterial soaps.  If your skin becomes reddened/irritated stop using the CHG and inform your nurse when you arrive at Short Stay. Do not shave (including legs and underarms) for at least 48 hours prior to the first CHG shower.  You may shave your face/neck. Please follow these instructions carefully:  1.  Shower with CHG Soap the night before surgery and the  morning of Surgery.  2.  If you choose to wash your hair, wash your hair first as usual with your  normal  shampoo.  3.  After you shampoo, rinse your hair and body thoroughly to remove the  shampoo.                           4.  Use CHG as you would any other liquid soap.  You can apply chg directly  to the skin and wash  Gently with a scrungie or clean washcloth.  5.  Apply the CHG Soap to your body ONLY FROM THE NECK DOWN.   Do not use on face/ open                           Wound or open sores. Avoid contact with eyes, ears mouth and genitals (private parts).                       Wash face,  Genitals (private parts) with your normal soap.             6.  Wash thoroughly, paying special attention to the area where your surgery  will be performed.  7.  Thoroughly rinse your body with warm water from the neck down.  8.  DO NOT shower/wash with your normal soap after using and rinsing off  the CHG Soap.                9.  Pat yourself dry with a clean towel.            10.  Wear clean pajamas.            11.  Place clean sheets on your bed the night of your  first shower and do not  sleep with pets. Day of Surgery : Do not apply any lotions/deodorants the morning of surgery.  Please wear clean clothes to the hospital/surgery center.  FAILURE TO FOLLOW THESE INSTRUCTIONS MAY RESULT IN THE CANCELLATION OF YOUR SURGERY PATIENT SIGNATURE_________________________________  NURSE SIGNATURE__________________________________  ________________________________________________________________________

## 2021-11-21 ENCOUNTER — Encounter (HOSPITAL_COMMUNITY): Payer: Self-pay

## 2021-11-21 ENCOUNTER — Encounter (HOSPITAL_COMMUNITY)
Admission: RE | Admit: 2021-11-21 | Discharge: 2021-11-21 | Disposition: A | Discharge status: Home / Self Care | Payer: BC Managed Care – PPO | Source: Ambulatory Visit | Attending: General Surgery | Admitting: General Surgery

## 2021-11-21 ENCOUNTER — Other Ambulatory Visit: Payer: Self-pay

## 2021-11-21 VITALS — BP 108/66 | HR 82 | Temp 98.0°F | Ht 59.0 in | Wt 165.0 lb

## 2021-11-21 DIAGNOSIS — R7303 Prediabetes: Secondary | ICD-10-CM | POA: Diagnosis not present

## 2021-11-21 DIAGNOSIS — I2510 Atherosclerotic heart disease of native coronary artery without angina pectoris: Secondary | ICD-10-CM | POA: Insufficient documentation

## 2021-11-21 DIAGNOSIS — Z01818 Encounter for other preprocedural examination: Secondary | ICD-10-CM | POA: Diagnosis present

## 2021-11-21 HISTORY — DX: Prediabetes: R73.03

## 2021-11-21 LAB — BASIC METABOLIC PANEL
Anion gap: 8 (ref 5–15)
BUN: 11 mg/dL (ref 6–20)
CO2: 24 mmol/L (ref 22–32)
Calcium: 8.9 mg/dL (ref 8.9–10.3)
Chloride: 106 mmol/L (ref 98–111)
Creatinine, Ser: 1.56 mg/dL — ABNORMAL HIGH (ref 0.44–1.00)
GFR, Estimated: 46 mL/min — ABNORMAL LOW (ref >60)
Glucose, Bld: 87 mg/dL (ref 70–99)
Potassium: 3.9 mmol/L (ref 3.5–5.1)
Sodium: 138 mmol/L (ref 135–145)

## 2021-11-21 LAB — CBC
HCT: 38.1 % (ref 36.0–46.0)
Hemoglobin: 12.5 g/dL (ref 12.0–15.0)
MCH: 30.4 pg (ref 26.0–34.0)
MCHC: 32.8 g/dL (ref 30.0–36.0)
MCV: 92.7 fL (ref 80.0–100.0)
Platelets: 395 10*3/uL (ref 150–400)
RBC: 4.11 MIL/uL (ref 3.87–5.11)
RDW: 13.3 % (ref 11.5–15.5)
WBC: 11.9 10*3/uL — ABNORMAL HIGH (ref 4.0–10.5)
nRBC: 0 % (ref 0.0–0.2)

## 2021-11-21 LAB — HEMOGLOBIN A1C
Hgb A1c MFr Bld: 4.6 % — ABNORMAL LOW (ref 4.8–5.6)
Mean Plasma Glucose: 85.32 mg/dL

## 2021-11-21 LAB — GLUCOSE, CAPILLARY: Glucose-Capillary: 94 mg/dL (ref 70–99)

## 2021-11-21 NOTE — Progress Notes (Addendum)
For Short Stay: [place] appointment date: Date of COVID positive in last 4 days:  Bowel Prep reminder:   For Anesthesia: PCP - Dr. Scarlette Calico Cardiologist -   Chest x-ray -  EKG -  Stress Test -  ECHO -  Cardiac Cath -  Pacemaker/ICD device last checked: Pacemaker orders received: Device Rep notified:  Spinal Cord Stimulator:  Sleep Study -  CPAP -   Fasting Blood Sugar - N/A Checks Blood Sugar __0___ times a day Date and result of last Hgb A1c- 4.9: 07/28/21  Blood Thinner Instructions: Aspirin Instructions: Last Dose:  Activity level: Can go up a flight of stairs and activities of daily living without stopping and without chest pain and/or shortness of breath   Able to exercise without chest pain and/or shortness of breath   Unable to go up a flight of stairs without chest pain and/or shortness of breath     Anesthesia review: Hx: Pre-DIA,HTN  Patient denies shortness of breath, fever, cough and chest pain at PAT appointment   Patient verbalized understanding of instructions that were given to them at the PAT appointment. Patient was also instructed that they will need to review over the PAT instructions again at home before surgery.

## 2021-11-23 ENCOUNTER — Encounter (HOSPITAL_COMMUNITY): Payer: Self-pay | Admitting: Gastroenterology

## 2021-11-23 ENCOUNTER — Encounter (HOSPITAL_COMMUNITY)
Admission: RE | Disposition: A | Discharge status: Home / Self Care | Payer: Self-pay | Source: Home / Self Care | Attending: Gastroenterology

## 2021-11-23 ENCOUNTER — Ambulatory Visit (HOSPITAL_COMMUNITY)
Admission: RE | Admit: 2021-11-23 | Discharge: 2021-11-23 | Disposition: A | Discharge status: Home / Self Care | Payer: BC Managed Care – PPO | Attending: Gastroenterology | Admitting: Gastroenterology

## 2021-11-23 DIAGNOSIS — K219 Gastro-esophageal reflux disease without esophagitis: Secondary | ICD-10-CM | POA: Insufficient documentation

## 2021-11-23 DIAGNOSIS — R1110 Vomiting, unspecified: Secondary | ICD-10-CM | POA: Diagnosis not present

## 2021-11-23 SURGERY — INVASIVE LAB ABORTED CASE

## 2021-11-23 MED ORDER — LIDOCAINE VISCOUS HCL 2 % MT SOLN
OROMUCOSAL | Status: AC
  Filled 2021-11-23: qty 15

## 2021-11-23 SURGICAL SUPPLY — 2 items
FACESHIELD LNG OPTICON STERILE (SAFETY) IMPLANT
GLOVE BIO SURGEON STRL SZ8 (GLOVE) ×6 IMPLANT

## 2021-11-23 NOTE — Progress Notes (Signed)
Attempted esophageal manometry.  Patient vomiting with lidocaine in nare. 2 attempts made to place probe with patient vomiting each time.  Sprayed throat with cetacaine one time.  Made one more attempt with 2nd RN.  Patient vomiting and unable to tolerate.  Patient tearful and refused further attempts.  Patient has hiatal hernia surgery scheduled for 28th of this month.  Will notify Dr Chester Holstein Mansouraty and Dr Greer Pickerel of aborted procedure.

## 2021-12-01 ENCOUNTER — Other Ambulatory Visit: Payer: Self-pay | Admitting: *Deleted

## 2021-12-01 ENCOUNTER — Inpatient Hospital Stay: Payer: BC Managed Care – PPO | Attending: Physician Assistant

## 2021-12-01 ENCOUNTER — Inpatient Hospital Stay: Payer: BC Managed Care – PPO

## 2021-12-01 ENCOUNTER — Other Ambulatory Visit: Payer: Self-pay

## 2021-12-01 DIAGNOSIS — D509 Iron deficiency anemia, unspecified: Secondary | ICD-10-CM | POA: Insufficient documentation

## 2021-12-01 DIAGNOSIS — E538 Deficiency of other specified B group vitamins: Secondary | ICD-10-CM

## 2021-12-01 LAB — CMP (CANCER CENTER ONLY)
ALT: 25 U/L (ref 0–44)
AST: 19 U/L (ref 15–41)
Albumin: 4 g/dL (ref 3.5–5.0)
Alkaline Phosphatase: 60 U/L (ref 38–126)
Anion gap: 7 (ref 5–15)
BUN: 9 mg/dL (ref 6–20)
CO2: 21 mmol/L — ABNORMAL LOW (ref 22–32)
Calcium: 8.9 mg/dL (ref 8.9–10.3)
Chloride: 110 mmol/L (ref 98–111)
Creatinine: 0.81 mg/dL (ref 0.44–1.00)
GFR, Estimated: >60 mL/min (ref >60)
Glucose, Bld: 79 mg/dL (ref 70–99)
Potassium: 3.6 mmol/L (ref 3.5–5.1)
Sodium: 138 mmol/L (ref 135–145)
Total Bilirubin: 0.4 mg/dL (ref 0.3–1.2)
Total Protein: 7.6 g/dL (ref 6.5–8.1)

## 2021-12-01 LAB — CBC WITH DIFFERENTIAL (CANCER CENTER ONLY)
Abs Immature Granulocytes: 0.04 10*3/uL (ref 0.00–0.07)
Basophils Absolute: 0 10*3/uL (ref 0.0–0.1)
Basophils Relative: 0 %
Eosinophils Absolute: 0 10*3/uL (ref 0.0–0.5)
Eosinophils Relative: 0 %
HCT: 35.5 % — ABNORMAL LOW (ref 36.0–46.0)
Hemoglobin: 12.5 g/dL (ref 12.0–15.0)
Immature Granulocytes: 0 %
Lymphocytes Relative: 23 %
Lymphs Abs: 2.6 10*3/uL (ref 0.7–4.0)
MCH: 31.3 pg (ref 26.0–34.0)
MCHC: 35.2 g/dL (ref 30.0–36.0)
MCV: 89 fL (ref 80.0–100.0)
Monocytes Absolute: 0.6 10*3/uL (ref 0.1–1.0)
Monocytes Relative: 5 %
Neutro Abs: 8.2 10*3/uL — ABNORMAL HIGH (ref 1.7–7.7)
Neutrophils Relative %: 72 %
Platelet Count: 357 10*3/uL (ref 150–400)
RBC: 3.99 MIL/uL (ref 3.87–5.11)
RDW: 13.2 % (ref 11.5–15.5)
WBC Count: 11.5 10*3/uL — ABNORMAL HIGH (ref 4.0–10.5)
nRBC: 0 % (ref 0.0–0.2)

## 2021-12-01 LAB — RETIC PANEL
Immature Retic Fract: 14.1 % (ref 2.3–15.9)
RBC.: 4.02 MIL/uL (ref 3.87–5.11)
Retic Count, Absolute: 83.6 10*3/uL (ref 19.0–186.0)
Retic Ct Pct: 2.1 % (ref 0.4–3.1)
Reticulocyte Hemoglobin: 34.2 pg (ref >27.9)

## 2021-12-01 LAB — IRON AND IRON BINDING CAPACITY (CC-WL,HP ONLY)
Iron: 39 ug/dL (ref 28–170)
Saturation Ratios: 12 % (ref 10.4–31.8)
TIBC: 334 ug/dL (ref 250–450)
UIBC: 295 ug/dL

## 2021-12-01 LAB — VITAMIN B12: Vitamin B-12: 252 pg/mL (ref 180–914)

## 2021-12-01 MED ORDER — CYANOCOBALAMIN 1000 MCG/ML IJ SOLN
1000.0000 ug | Freq: Once | INTRAMUSCULAR | Status: AC
  Administered 2021-12-01: 1000 ug via INTRAMUSCULAR
  Filled 2021-12-01: qty 1

## 2021-12-02 ENCOUNTER — Telehealth: Payer: Self-pay | Admitting: *Deleted

## 2021-12-02 LAB — FERRITIN: Ferritin: 48 ng/mL (ref 11–307)

## 2021-12-02 NOTE — Telephone Encounter (Signed)
-----   Message from Lincoln Brigham, PA-C sent at 12/02/2021 11:53 AM EDT ----- Lovey Newcomer,   Please notify patient that labs look stable without any anemia. Continue to take iron pills once a day.  No need for IV iron at this time.   Thanks, Murray Hodgkins  ----- Message ----- From: Buel Ream, Lab In [place] Sent: 12/01/2021   3:37 PM EDT To: Lincoln Brigham, PA-C

## 2021-12-02 NOTE — Telephone Encounter (Signed)
Contacted patient as directed by provider with information in message below. Advised her to take iron with either a vitamin c tablet or with orange juice to aid absorption. Encouraged her to call office for questions or concerns.  Patient verbalized understanding.

## 2021-12-09 DIAGNOSIS — Z975 Presence of (intrauterine) contraceptive device: Secondary | ICD-10-CM

## 2021-12-09 HISTORY — DX: Presence of (intrauterine) contraceptive device: Z97.5

## 2021-12-12 ENCOUNTER — Encounter (HOSPITAL_COMMUNITY): Payer: Self-pay | Admitting: General Surgery

## 2021-12-12 ENCOUNTER — Other Ambulatory Visit: Payer: Self-pay

## 2021-12-12 ENCOUNTER — Observation Stay (HOSPITAL_COMMUNITY)
Admission: RE | Admit: 2021-12-12 | Discharge: 2021-12-13 | Disposition: A | Discharge status: Home / Self Care | Payer: BC Managed Care – PPO | Source: Ambulatory Visit | Attending: General Surgery | Admitting: General Surgery

## 2021-12-12 ENCOUNTER — Ambulatory Visit (HOSPITAL_COMMUNITY): Payer: BC Managed Care – PPO | Admitting: Anesthesiology

## 2021-12-12 ENCOUNTER — Encounter (HOSPITAL_COMMUNITY)
Admission: RE | Disposition: A | Discharge status: Home / Self Care | Payer: Self-pay | Source: Ambulatory Visit | Attending: General Surgery

## 2021-12-12 DIAGNOSIS — Z9889 Other specified postprocedural states: Secondary | ICD-10-CM | POA: Diagnosis present

## 2021-12-12 DIAGNOSIS — Z01818 Encounter for other preprocedural examination: Secondary | ICD-10-CM

## 2021-12-12 DIAGNOSIS — I10 Essential (primary) hypertension: Secondary | ICD-10-CM | POA: Insufficient documentation

## 2021-12-12 DIAGNOSIS — K449 Diaphragmatic hernia without obstruction or gangrene: Secondary | ICD-10-CM | POA: Diagnosis not present

## 2021-12-12 DIAGNOSIS — E669 Obesity, unspecified: Secondary | ICD-10-CM | POA: Diagnosis not present

## 2021-12-12 DIAGNOSIS — K219 Gastro-esophageal reflux disease without esophagitis: Secondary | ICD-10-CM | POA: Diagnosis not present

## 2021-12-12 HISTORY — PX: XI ROBOTIC ASSISTED HIATAL HERNIA REPAIR: SHX6889

## 2021-12-12 HISTORY — PX: UPPER GI ENDOSCOPY: SHX6162

## 2021-12-12 LAB — POCT PREGNANCY, URINE: Preg Test, Ur: NEGATIVE

## 2021-12-12 SURGERY — REPAIR, HERNIA, HIATAL, ROBOT-ASSISTED
Anesthesia: General

## 2021-12-12 MED ORDER — MIDAZOLAM HCL 2 MG/2ML IJ SOLN
INTRAMUSCULAR | Status: AC
  Filled 2021-12-12: qty 2

## 2021-12-12 MED ORDER — PROMETHAZINE HCL 25 MG/ML IJ SOLN
6.2500 mg | Freq: Once | INTRAMUSCULAR | Status: AC
  Administered 2021-12-12: 6.25 mg via INTRAVENOUS

## 2021-12-12 MED ORDER — LAMOTRIGINE 100 MG PO TABS
200.0000 mg | ORAL_TABLET | Freq: Every day | ORAL | Status: DC
  Administered 2021-12-13: 200 mg via ORAL
  Filled 2021-12-12: qty 2

## 2021-12-12 MED ORDER — EPHEDRINE SULFATE-NACL 50-0.9 MG/10ML-% IV SOSY
PREFILLED_SYRINGE | INTRAVENOUS | Status: DC | PRN
  Administered 2021-12-12: 10 mg via INTRAVENOUS

## 2021-12-12 MED ORDER — SODIUM CHLORIDE (PF) 0.9 % IJ SOLN
INTRAMUSCULAR | Status: DC | PRN
  Administered 2021-12-12: 50 mL

## 2021-12-12 MED ORDER — HYDROMORPHONE HCL 1 MG/ML IJ SOLN
INTRAMUSCULAR | Status: AC
  Filled 2021-12-12: qty 1

## 2021-12-12 MED ORDER — CHLORHEXIDINE GLUCONATE 0.12 % MT SOLN
15.0000 mL | Freq: Once | OROMUCOSAL | Status: AC
  Administered 2021-12-12: 15 mL via OROMUCOSAL

## 2021-12-12 MED ORDER — ONDANSETRON HCL 4 MG/2ML IJ SOLN
INTRAMUSCULAR | Status: AC
  Filled 2021-12-12: qty 2

## 2021-12-12 MED ORDER — DIPHENHYDRAMINE HCL 50 MG/ML IJ SOLN
12.5000 mg | Freq: Four times a day (QID) | INTRAMUSCULAR | Status: DC | PRN
  Administered 2021-12-12: 12.5 mg via INTRAVENOUS
  Filled 2021-12-12: qty 1

## 2021-12-12 MED ORDER — DEXAMETHASONE SODIUM PHOSPHATE 4 MG/ML IJ SOLN: 4.0000 mg | INTRAMUSCULAR | Status: DC

## 2021-12-12 MED ORDER — SUGAMMADEX SODIUM 200 MG/2ML IV SOLN
INTRAVENOUS | Status: DC | PRN
  Administered 2021-12-12: 200 mg via INTRAVENOUS

## 2021-12-12 MED ORDER — STERILE WATER FOR IRRIGATION IR SOLN
Status: DC | PRN
  Administered 2021-12-12: 500 mL

## 2021-12-12 MED ORDER — ORAL CARE MOUTH RINSE: 15.0000 mL | Freq: Once | OROMUCOSAL | Status: AC

## 2021-12-12 MED ORDER — LACTATED RINGERS IR SOLN
Status: DC | PRN
  Administered 2021-12-12: 1000 mL

## 2021-12-12 MED ORDER — FENTANYL CITRATE (PF) 250 MCG/5ML IJ SOLN
INTRAMUSCULAR | Status: AC
  Filled 2021-12-12: qty 5

## 2021-12-12 MED ORDER — KETOROLAC TROMETHAMINE 30 MG/ML IJ SOLN
30.0000 mg | Freq: Three times a day (TID) | INTRAMUSCULAR | Status: DC
  Administered 2021-12-12 – 2021-12-13 (×3): 30 mg via INTRAVENOUS
  Filled 2021-12-12 (×2): qty 1

## 2021-12-12 MED ORDER — DEXAMETHASONE SODIUM PHOSPHATE 10 MG/ML IJ SOLN
INTRAMUSCULAR | Status: DC | PRN
  Administered 2021-12-12: 10 mg via INTRAVENOUS

## 2021-12-12 MED ORDER — LURASIDONE HCL 40 MG PO TABS
40.0000 mg | ORAL_TABLET | Freq: Every day | ORAL | Status: DC
  Filled 2021-12-12: qty 1

## 2021-12-12 MED ORDER — HEPARIN SODIUM (PORCINE) 5000 UNIT/ML IJ SOLN
5000.0000 [IU] | Freq: Once | INTRAMUSCULAR | Status: AC
Start: 2021-12-12 — End: 2021-12-12

## 2021-12-12 MED ORDER — SIMETHICONE 80 MG PO CHEW: 40.0000 mg | CHEWABLE_TABLET | Freq: Four times a day (QID) | ORAL | Status: DC | PRN

## 2021-12-12 MED ORDER — LIDOCAINE 2% (20 MG/ML) 5 ML SYRINGE
INTRAMUSCULAR | Status: DC | PRN
  Administered 2021-12-12: 80 mg via INTRAVENOUS
  Administered 2021-12-12: 1.5 mg/kg/h via INTRAVENOUS

## 2021-12-12 MED ORDER — CELECOXIB 200 MG PO CAPS
200.0000 mg | ORAL_CAPSULE | Freq: Once | ORAL | Status: AC
  Administered 2021-12-12: 200 mg via ORAL
  Filled 2021-12-12: qty 1

## 2021-12-12 MED ORDER — DIPHENHYDRAMINE HCL 12.5 MG/5ML PO ELIX: 12.5000 mg | ORAL_SOLUTION | Freq: Four times a day (QID) | ORAL | Status: DC | PRN

## 2021-12-12 MED ORDER — HYDROMORPHONE HCL 1 MG/ML IJ SOLN
0.2500 mg | INTRAMUSCULAR | Status: DC | PRN
  Administered 2021-12-12 (×3): 0.5 mg via INTRAVENOUS

## 2021-12-12 MED ORDER — LACTATED RINGERS IV SOLN: INTRAVENOUS | Status: DC

## 2021-12-12 MED ORDER — CHLORHEXIDINE GLUCONATE CLOTH 2 % EX PADS: 6.0000 | MEDICATED_PAD | Freq: Once | CUTANEOUS | Status: DC

## 2021-12-12 MED ORDER — 0.9 % SODIUM CHLORIDE (POUR BTL) OPTIME
TOPICAL | Status: DC | PRN
  Administered 2021-12-12: 1000 mL

## 2021-12-12 MED ORDER — KETOROLAC TROMETHAMINE 30 MG/ML IJ SOLN
INTRAMUSCULAR | Status: AC
  Filled 2021-12-12: qty 1

## 2021-12-12 MED ORDER — OXYCODONE HCL 5 MG PO TABS
5.0000 mg | ORAL_TABLET | ORAL | Status: DC | PRN
  Administered 2021-12-13: 5 mg via ORAL
  Filled 2021-12-12 (×2): qty 1

## 2021-12-12 MED ORDER — PROPOFOL 10 MG/ML IV BOLUS
INTRAVENOUS | Status: DC | PRN
  Administered 2021-12-12: 150 mg via INTRAVENOUS

## 2021-12-12 MED ORDER — ENOXAPARIN SODIUM 40 MG/0.4ML IJ SOSY
40.0000 mg | PREFILLED_SYRINGE | INTRAMUSCULAR | Status: DC
  Administered 2021-12-13: 40 mg via SUBCUTANEOUS
  Filled 2021-12-12: qty 0.4

## 2021-12-12 MED ORDER — HEPARIN SODIUM (PORCINE) 5000 UNIT/ML IJ SOLN
INTRAMUSCULAR | Status: AC
  Administered 2021-12-12: 5000 [IU] via SUBCUTANEOUS
  Filled 2021-12-12: qty 1

## 2021-12-12 MED ORDER — AMISULPRIDE (ANTIEMETIC) 5 MG/2ML IV SOLN
INTRAVENOUS | Status: AC
  Filled 2021-12-12: qty 4

## 2021-12-12 MED ORDER — ONDANSETRON HCL 4 MG/2ML IJ SOLN: 4.0000 mg | Freq: Four times a day (QID) | INTRAMUSCULAR | Status: DC | PRN

## 2021-12-12 MED ORDER — PROMETHAZINE HCL 25 MG/ML IJ SOLN
INTRAMUSCULAR | Status: AC
  Filled 2021-12-12: qty 1

## 2021-12-12 MED ORDER — BUPIVACAINE LIPOSOME 1.3 % IJ SUSP: 20.0000 mL | Freq: Once | INTRAMUSCULAR | Status: DC

## 2021-12-12 MED ORDER — ACETAMINOPHEN 500 MG PO TABS: 1000.0000 mg | ORAL_TABLET | Freq: Three times a day (TID) | ORAL | Status: DC

## 2021-12-12 MED ORDER — ROCURONIUM BROMIDE 10 MG/ML (PF) SYRINGE
PREFILLED_SYRINGE | INTRAVENOUS | Status: DC | PRN
  Administered 2021-12-12: 60 mg via INTRAVENOUS
  Administered 2021-12-12: 20 mg via INTRAVENOUS
  Administered 2021-12-12: 10 mg via INTRAVENOUS

## 2021-12-12 MED ORDER — ACETAMINOPHEN 160 MG/5ML PO SOLN
1000.0000 mg | Freq: Three times a day (TID) | ORAL | Status: DC
  Administered 2021-12-12 – 2021-12-13 (×3): 1000 mg via ORAL
  Filled 2021-12-12 (×3): qty 40.6

## 2021-12-12 MED ORDER — BUPIVACAINE LIPOSOME 1.3 % IJ SUSP
INTRAMUSCULAR | Status: AC
  Filled 2021-12-12: qty 20

## 2021-12-12 MED ORDER — ONDANSETRON 4 MG PO TBDP: 4.0000 mg | ORAL_TABLET | Freq: Four times a day (QID) | ORAL | Status: DC | PRN

## 2021-12-12 MED ORDER — FENTANYL CITRATE (PF) 100 MCG/2ML IJ SOLN
INTRAMUSCULAR | Status: DC | PRN
  Administered 2021-12-12 (×4): 50 ug via INTRAVENOUS
  Administered 2021-12-12: 100 ug via INTRAVENOUS
  Administered 2021-12-12: 50 ug via INTRAVENOUS

## 2021-12-12 MED ORDER — CEFAZOLIN SODIUM-DEXTROSE 2-4 GM/100ML-% IV SOLN
INTRAVENOUS | Status: AC
  Filled 2021-12-12: qty 100

## 2021-12-12 MED ORDER — SCOPOLAMINE 1 MG/3DAYS TD PT72
MEDICATED_PATCH | TRANSDERMAL | Status: AC
  Administered 2021-12-12: 1.5 mg via TRANSDERMAL
  Filled 2021-12-12: qty 1

## 2021-12-12 MED ORDER — ACETAMINOPHEN 500 MG PO TABS
1000.0000 mg | ORAL_TABLET | Freq: Once | ORAL | Status: AC
Start: 2021-12-12 — End: 2021-12-12
  Administered 2021-12-12: 1000 mg via ORAL
  Filled 2021-12-12: qty 2

## 2021-12-12 MED ORDER — PANTOPRAZOLE SODIUM 40 MG IV SOLR
40.0000 mg | Freq: Every day | INTRAVENOUS | Status: DC
  Administered 2021-12-12: 40 mg via INTRAVENOUS
  Filled 2021-12-12: qty 10

## 2021-12-12 MED ORDER — BUPIVACAINE LIPOSOME 1.3 % IJ SUSP
INTRAMUSCULAR | Status: DC | PRN
  Administered 2021-12-12: 20 mL

## 2021-12-12 MED ORDER — FENTANYL CITRATE (PF) 100 MCG/2ML IJ SOLN
INTRAMUSCULAR | Status: AC
  Filled 2021-12-12: qty 2

## 2021-12-12 MED ORDER — AMISULPRIDE (ANTIEMETIC) 5 MG/2ML IV SOLN
10.0000 mg | Freq: Once | INTRAVENOUS | Status: AC
Start: 2021-12-12 — End: 2021-12-12
  Administered 2021-12-12: 10 mg via INTRAVENOUS

## 2021-12-12 MED ORDER — ONDANSETRON HCL 4 MG/2ML IJ SOLN
INTRAMUSCULAR | Status: DC | PRN
  Administered 2021-12-12: 4 mg via INTRAVENOUS

## 2021-12-12 MED ORDER — MIDAZOLAM HCL 5 MG/5ML IJ SOLN
INTRAMUSCULAR | Status: DC | PRN
  Administered 2021-12-12: 2 mg via INTRAVENOUS

## 2021-12-12 MED ORDER — CEFAZOLIN SODIUM-DEXTROSE 2-4 GM/100ML-% IV SOLN
2.0000 g | INTRAVENOUS | Status: AC
  Administered 2021-12-12: 2 g via INTRAVENOUS

## 2021-12-12 MED ORDER — PROPOFOL 10 MG/ML IV BOLUS
INTRAVENOUS | Status: AC
  Filled 2021-12-12: qty 20

## 2021-12-12 MED ORDER — SCOPOLAMINE 1 MG/3DAYS TD PT72: 1.0000 | MEDICATED_PATCH | TRANSDERMAL | Status: DC

## 2021-12-12 MED ORDER — MORPHINE SULFATE (PF) 2 MG/ML IV SOLN
1.0000 mg | INTRAVENOUS | Status: DC | PRN
  Administered 2021-12-12 (×2): 1 mg via INTRAVENOUS
  Administered 2021-12-13 (×2): 2 mg via INTRAVENOUS
  Filled 2021-12-12 (×4): qty 1

## 2021-12-12 MED ORDER — PHENYLEPHRINE 80 MCG/ML (10ML) SYRINGE FOR IV PUSH (FOR BLOOD PRESSURE SUPPORT)
PREFILLED_SYRINGE | INTRAVENOUS | Status: DC | PRN
  Administered 2021-12-12: 160 ug via INTRAVENOUS
  Administered 2021-12-12: 80 ug via INTRAVENOUS
  Administered 2021-12-12: 160 ug via INTRAVENOUS

## 2021-12-12 MED ORDER — MELATONIN 3 MG PO TABS: 3.0000 mg | ORAL_TABLET | Freq: Every evening | ORAL | Status: DC | PRN

## 2021-12-12 MED ORDER — KCL IN DEXTROSE-NACL 20-5-0.45 MEQ/L-%-% IV SOLN
INTRAVENOUS | Status: DC
  Filled 2021-12-12 (×2): qty 1000

## 2021-12-12 SURGICAL SUPPLY — 62 items
APPLIER CLIP 5 13 M/L LIGAMAX5 (MISCELLANEOUS)
APPLIER CLIP ROT 10 11.4 M/L (STAPLE)
BLADE SURG SZ11 CARB STEEL (BLADE) ×1 IMPLANT
CHLORAPREP W/TINT 26 (MISCELLANEOUS) ×1 IMPLANT
CLIP APPLIE 5 13 M/L LIGAMAX5 (MISCELLANEOUS) IMPLANT
CLIP APPLIE ROT 10 11.4 M/L (STAPLE) IMPLANT
COVER SURGICAL LIGHT HANDLE (MISCELLANEOUS) ×1 IMPLANT
COVER TIP SHEARS 8 DVNC (MISCELLANEOUS) IMPLANT
COVER TIP SHEARS 8MM DA VINCI (MISCELLANEOUS) ×1
DEFOGGER SCOPE WARMER CLEARIFY (MISCELLANEOUS) IMPLANT
DERMABOND ADVANCED (GAUZE/BANDAGES/DRESSINGS)
DERMABOND ADVANCED .7 DNX12 (GAUZE/BANDAGES/DRESSINGS) IMPLANT
DRAIN PENROSE 0.5X18 (DRAIN) IMPLANT
DRAPE ARM DVNC X/XI (DISPOSABLE) ×4 IMPLANT
DRAPE COLUMN DVNC XI (DISPOSABLE) ×1 IMPLANT
DRAPE DA VINCI XI ARM (DISPOSABLE) ×4
DRAPE DA VINCI XI COLUMN (DISPOSABLE) ×1
DRSG TEGADERM 2-3/8X2-3/4 SM (GAUZE/BANDAGES/DRESSINGS) ×6 IMPLANT
ELECT REM PT RETURN 15FT ADLT (MISCELLANEOUS) ×1 IMPLANT
GAUZE 4X4 16PLY ~~LOC~~+RFID DBL (SPONGE) ×1 IMPLANT
GAUZE SPONGE 2X2 8PLY STRL LF (GAUZE/BANDAGES/DRESSINGS) ×1 IMPLANT
GLOVE BIO SURGEON STRL SZ7.5 (GLOVE) ×2 IMPLANT
GLOVE INDICATOR 8.0 STRL GRN (GLOVE) ×2 IMPLANT
GOWN STRL REUS W/ TWL XL LVL3 (GOWN DISPOSABLE) IMPLANT
GOWN STRL REUS W/TWL XL LVL3 (GOWN DISPOSABLE)
GRASPER SUT TROCAR 14GX15 (MISCELLANEOUS) IMPLANT
IRRIG SUCT STRYKERFLOW 2 WTIP (MISCELLANEOUS) ×1
IRRIGATION SUCT STRKRFLW 2 WTP (MISCELLANEOUS) ×1 IMPLANT
KIT BASIN OR (CUSTOM PROCEDURE TRAY) ×1 IMPLANT
KIT TURNOVER KIT A (KITS) IMPLANT
LUBRICANT JELLY K Y 4OZ (MISCELLANEOUS) IMPLANT
MARKER SKIN DUAL TIP RULER LAB (MISCELLANEOUS) IMPLANT
NEEDLE HYPO 22GX1.5 SAFETY (NEEDLE) ×1 IMPLANT
PACK CARDIOVASCULAR III (CUSTOM PROCEDURE TRAY) ×1 IMPLANT
PAD POSITIONING PINK XL (MISCELLANEOUS) ×1 IMPLANT
SCISSORS LAP 5X35 DISP (ENDOMECHANICALS) IMPLANT
SEAL CANN UNIV 5-8 DVNC XI (MISCELLANEOUS) ×4 IMPLANT
SEAL XI 5MM-8MM UNIVERSAL (MISCELLANEOUS) ×4
SEALER VESSEL DA VINCI XI (MISCELLANEOUS) ×1
SEALER VESSEL EXT DVNC XI (MISCELLANEOUS) ×1 IMPLANT
SOL ANTI FOG 6CC (MISCELLANEOUS) ×1 IMPLANT
SOLUTION ANTI FOG 6CC (MISCELLANEOUS) ×1
SOLUTION ELECTROLUBE (MISCELLANEOUS) ×1 IMPLANT
SPIKE FLUID TRANSFER (MISCELLANEOUS) ×1 IMPLANT
STRIP CLOSURE SKIN 1/2X4 (GAUZE/BANDAGES/DRESSINGS) ×1 IMPLANT
SUT ETHIBOND 0 36 GRN (SUTURE) ×3 IMPLANT
SUT MNCRL AB 4-0 PS2 18 (SUTURE) ×1 IMPLANT
SUT SILK 0 SH 30 (SUTURE) IMPLANT
SUT SILK 2 0 SH (SUTURE) IMPLANT
SUT VIC AB 0 UR5 27 (SUTURE) IMPLANT
SYR 10ML ECCENTRIC (SYRINGE) ×1 IMPLANT
SYR 20ML LL LF (SYRINGE) ×1 IMPLANT
TIP INNERVISION DETACH 40FR (MISCELLANEOUS) IMPLANT
TIP INNERVISION DETACH 50FR (MISCELLANEOUS) IMPLANT
TIP INNERVISION DETACH 56FR (MISCELLANEOUS) IMPLANT
TIPS INNERVISION DETACH 40FR (MISCELLANEOUS)
TOWEL OR 17X26 10 PK STRL BLUE (TOWEL DISPOSABLE) ×1 IMPLANT
TRAY FOLEY MTR SLVR 16FR STAT (SET/KITS/TRAYS/PACK) IMPLANT
TROCAR ADV FIXATION 12X100MM (TROCAR) IMPLANT
TROCAR ADV FIXATION 5X100MM (TROCAR) IMPLANT
TROCAR XCEL NON-BLD 5MMX100MML (ENDOMECHANICALS) ×1 IMPLANT
TUBING INSUFFLATION 10FT LAP (TUBING) ×1 IMPLANT

## 2021-12-12 NOTE — Op Note (Signed)
12/12/2021  12:50 PM  PATIENT:  Joan Simpson  31 y.o. 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  Her heartburn worsened after giving birth to her child about 10 years ago.  She describes her heartburn as a burning sensation all the way up to the top of her chest.  It is somewhat controlled with reflux medication but she will have breakthrough symptoms during the day and night.  She had an upper endoscopy which showed a moderate 6 cm hiatal hernia.  She is also had some iron deficiency anemia requiring IV iron.  She was unable to tolerate pH probe and esophageal manometry.  She had an upper GI which showed moderate size hiatal hernia with both sliding and paraesophageal components.  She had volume reflux up to the cervical esophagus.  There is no evidence of dysmotility.  The patient gave no clinical history of dysphagia.  PROCEDURE: Patient received subcutaneous heparin preoperatively.  She also received oral Tylenol and other enhanced recovery medications.  She was taken the OR to at [facility] long hospital placed supine on the operating room table.  General endotracheal anesthesia was established.  Sequential compression devices were placed.  Her arms were tucked at her side with the appropriate padding.  She received IV antibiotic.  A surgical timeout was performed.  Access to the abdomen  was obtained using the Optiview technique about 8 cm to the left of the umbilicus.  A small incision was made and then a 0 degree 5 mm laparoscope through a 5 mm trocar was advanced through all layers of abdominal wall and carefully entered the abdominal cavity.  Pneumoperitoneum was smoothly established to a patient pressure of 15 mmHg without any change in vital signs.  The laparoscope was advanced the abdominal cavity was surveilled.  There was no evidence of injury to surrounding structures.  It was placed in reverse Trendelenburg.  Next a bilateral laparoscopic tap block was performed for postoperative pain relief.  An 8 mm robotic trocar was placed slightly above into the left of the umbilicus, another 8 mm trocar 8 cm to the right of the umbilicus, a 5 mm assistant trocar in the right lateral abdominal wall, the Optiview trocar was exchanged for an 8 mm robotic trocar and a final 8 mm robotic trocar was placed in the left lateral abdominal wall under direct visualization.  A Nathanson liver retractor was then placed through the subxiphoid position to lift up the left lobe of the liver to expose the hiatus.  There was excellent visualization of the diaphragmatic defect.  The diaphragmatic defect was probably about 5 cm.  She had a moderate type III hiatal hernia.  The surgical robot was then docked.  I went to the surgeon's console to my assistant stated the bedside to assist with tissue retraction.  I started incising the hernia sac along the anterior diaphragm with the vessel sealer.  The patient had a thin  hernia sac.  We carried the dissection over to the right crus.  The gastrohepatic ligament was incised with vessel sealer.  I was able to meet the anterior dissection along with the lateral dissection on the right side.  The esophagus was identified and protected.  We were able to get to the mediastinal cavity.  Combination of gentle blunt dissection with the vessel sealer taking down tissue where  needed with the vessel sealer was then performed.  I then turned my attention to taking down the short gastric vessels along the greater curvature of the stomach.  We got into the lesser sac.  My assistant assisted with retracting the stomach.  The short gastrics were mobilized and taken down from the midportion of the stomach all the way up to the fundus to the left crus of the diaphragm.  We then got into the space in the mediastinum and continued our mobilization of the hernia sac and joined the anterior dissection plane.  I then was able to find the confluence of the left and right crura and my assistant introduced a Penrose drain to the abdomen we then wrapped it around the lower esophagus upper stomach to provide retraction.  I then went about performing a circumferential mobilization of the esophagus in the mediastinum.  The posterior vagus and anterior vagus nerves were both identified and preserved.  The aorta was visualized and the esophagus was lifted off the aorta with a combination of gentle blunt dissection along with vessel sealer.  We mobilized the esophagus for probably about 7 cm up into the mediastinum.  At this time I went bedside and obtained the Olympus endoscope placed in the patient's oropharynx and guided it down.  There is no evidence of esophageal injury.  I was able to get into the stomach and insufflated the stomach.  Then gently pulled back the scope.  We transilluminated where we identified the Z-line.  It was right at the level of the diaphragm.  The stomach was desufflated and the scope was removed.  I went back to the surgeon console to and continue to mobilize some additional esophagus taking down some posterior attachments from the esophagus to the aorta and some of the attachments on the left side of the esophagus ensuring that we were not on the esophagus.  This allowed me to get an additional 2 cm of esophageal length.  At this point the Z-line was below the diaphragm crura.  I  felt that we had achieved enough mobilization and my surgical colleague agreed.  We then reduce pneumoperitoneum to 10 mmHg.  I then reapproximated the left and right crura with 4 interrupted 0 Ethibond sutures.  This left a small gap around the esophagus.  The Penrose drain was removed.  I then debulked the hernia sac from the esophagus and proximal stomach along with some of the esophageal fat pad.  We ensured that we were away from the esophagus and the stomach when taking it down with the vessel sealer.  This was removed and discarded.  I then grasped some of the fundus where the short gastrics had been ligated and then brought that fundus retroesophageal to the patient's right side and then with the tip up grasper was able to grab some of the distal fundus and performed a shoeshine maneuver ensuring that we had the correct orientation to perform a fundoplication.  Since she had no clinical evidence of dysphagia and no evidence of dysmotility on upper GI I decided to do a  Nissen fundoplication.  Anesthesia passed a lighted 56 French bougie and we visualized it coming down the esophagus into the stomach.  I then performed a 2 cm loose Nissen over the bougie.  3 sutures were placed the sutures were 0 Ethibond and each of the sutures incorporated a small serosal bite of the esophagus.  The bougie was removed.  I was able to pass an instrument beneath the wrap and the esophagus.  There was one area of the distal esophagus just above the wrap that we inspected.  It is about 4 mm in length on the right side of the esophagus anteriorly laterally.  Not appear to be an esophageal injury.  It appeared more likely to be part of the hernia sac.  Nonetheless we flooded that area with saline while I did an upper endoscopy.  The endoscope was placed back in the patient's oropharynx and guided down.  There is no evidence of mucosal disruption on endoscopy.  We transilluminated the area and again there is no evidence of bubbles  or leak and no intraluminal evidence of injury.  I then advanced the scope into the stomach insufflated and retroflexed and visualized a intact wrap.  The stomach was desufflated and we reinspected the distal esophagus right above the wrap again that mucosa appeared normal and there is no evidence of bubbles.  The stomach and this was desufflated.  I scrubbed back in.  The surgical robot was undocked the Sibley liver retractor was removed without any evidence of injury to the liver.  Trocars were removed skin incisions were closed with a 4 Monocryl in a subcuticular fashion followed by the application of Steri-Strips 2 x 2's and sterile dressings.  No immediate complications.  All needle, instrument sponge counts were correct x2  PLAN OF CARE: Admit for overnight observation  PATIENT DISPOSITION:  PACU - hemodynamically stable.   Delay start of Pharmacological VTE agent (>24hrs) due to surgical blood loss or risk of bleeding:  no  Leighton Ruff. Redmond Pulling, MD, FACS General, Bariatric, & Minimally Invasive Surgery [facility], [place]

## 2021-12-12 NOTE — Progress Notes (Signed)
Notified Dr.Wilson that there are no premeds/antibiotic ordered.  Tylenol and celebrex were ordered by anesthesia and given.  He states he will put orders in epic.

## 2021-12-12 NOTE — Transfer of Care (Signed)
Immediate Anesthesia Transfer of Care Note  Patient: Joan Simpson  Procedure(s) Performed: XI ROBOTIC ASSISTED HIATAL HERNIA  REPAIR WITH FUNDOPLICATION UPPER GI ENDOSCOPY  Patient Location: PACU  Anesthesia Type:General  Level of Consciousness: sedated, patient cooperative and responds to stimulation  Airway & Oxygen Therapy: Patient Spontanous Breathing and Patient connected to face mask oxygen  Post-op Assessment: Report given to RN and Post -op Vital signs reviewed and stable  Post vital signs: Reviewed and stable  Last Vitals:  Vitals Value Taken Time  BP 141/80 12/12/21 1242  Temp    Pulse 106 12/12/21 1244  Resp 17 12/12/21 1244  SpO2 99 % 12/12/21 1244  Vitals shown include unvalidated device data.  Last Pain:  Vitals:   12/12/21 0817  TempSrc:   PainSc: 0-No pain         Complications: No notable events documented.

## 2021-12-12 NOTE — Anesthesia Preprocedure Evaluation (Addendum)
Anesthesia Evaluation  Patient identified by MRN, date of birth, ID band Patient awake    Reviewed: Allergy & Precautions, H&P , NPO status , Patient's Chart, lab work & pertinent test results  Airway Mallampati: II  TM Distance: >3 FB Neck ROM: Full    Dental no notable dental hx. (+) Teeth Intact, Dental Advisory Given   Pulmonary neg pulmonary ROS,    Pulmonary exam normal breath sounds clear to auscultation       Cardiovascular hypertension,  Rhythm:Regular Rate:Normal     Neuro/Psych Anxiety Depression Bipolar Disorder negative neurological ROS     GI/Hepatic Neg liver ROS, hiatal hernia, GERD  ,  Endo/Other  negative endocrine ROS  Renal/GU negative Renal ROS  negative genitourinary   Musculoskeletal   Abdominal   Peds  Hematology  (+) Blood dyscrasia, anemia ,   Anesthesia Other Findings   Reproductive/Obstetrics negative OB ROS                            Anesthesia Physical Anesthesia Plan  ASA: 2  Anesthesia Plan: General   Post-op Pain Management: Tylenol PO (pre-op)* and Celebrex PO (pre-op)*   Induction: Intravenous  PONV Risk Score and Plan: 4 or greater and Ondansetron, Dexamethasone and Midazolam  Airway Management Planned: Oral ETT  Additional Equipment:   Intra-op Plan:   Post-operative Plan: Extubation in OR  Informed Consent: I have reviewed the patients History and Physical, chart, labs and discussed the procedure including the risks, benefits and alternatives for the proposed anesthesia with the patient or authorized representative who has indicated his/her understanding and acceptance.     Dental advisory given  Plan Discussed with: CRNA  Anesthesia Plan Comments:         Anesthesia Quick Evaluation

## 2021-12-12 NOTE — H&P (Signed)
CC: here for surgery  Requesting provider: Dr Rush Landmark  HPI: Joan Simpson is an 31 y.o. female who is here for robotic repair of hiatal hernia with possible mesh and fundoplication and upper endoscopy.  She states that she can have heartburn pretty much anytime of the day and night.  She describes her heartburn as a burning sensation in her chest along with nausea.  No dysphagia.  No sensation of food or liquids getting stuck.  Otherwise she denies any medical changes since I saw her in the clinic  She denies any changes since seen in clinic in May.   Pt was unable to tolerate pH and manometry study  Old HPI from clinic: She is referred by GI to discuss her hiatal hernia and GERD. She states that she started having issues with GERD after her pregnancy. During her pregnancy in 2012 she had daily emesis for 8 months. She states that her GERD will wake her up in the middle the night. She states the burning will get so intense that her only relief is with vomiting. She will only generally vomit bile acid. Rare regurgitation of food. She states that over the years she has tried numerous over-the-counter medications like Nexium and Prilosec without any relief. She states that gastroenterology put her on Protonix without any change in her symptoms. Currently she is on rabeprazole which is helping some. She takes it with Gaviscon. She has any early satiety. She denies any dysphagia to solids or liquids. Denies any pain with eating. She does have burping and belching. She has bilateral lower abdominal cramps. This started a few months ago. She states that her bowel movement frequency has altered in the past few months as well. Now she is averaging about 1 bowel movement per week. She states GI put her on dicyclomine which has helped with her lower abdominal cramps. They were bilateral lower abdomen and was daily now they are just a few times a week. No prior abdominal surgery. She has sleep apnea but  does not use CPAP. She is on medication for bipolar disease. No recent hospitalizations. She had a history of iron deficiency anemia which led to an upper and lower endoscopy which are discussed below. She has had IV iron infusions by hematology. She is currently on vitamin B and vitamin D replacement therapy. She denies any early satiety.  Past Medical History:  Diagnosis Date   Allergy    Anemia    Anxiety    Bipolar 1 disorder ([place])    Depression    Hypertension    Pre-diabetes     Past Surgical History:  Procedure Laterality Date   NO PAST SURGERIES      Family History  Problem Relation Age of Onset   Diabetes Mother    Hypertension Mother    Heart disease Mother    Other Father        prediabetes   Hypertension Father    Diabetes Brother    Heart disease Brother    Diabetes Paternal Grandmother    Colon cancer Neg Hx    Esophageal cancer Neg Hx    Inflammatory bowel disease Neg Hx    Liver disease Neg Hx    Pancreatic cancer Neg Hx    Rectal cancer Neg Hx    Stomach cancer Neg Hx     Social:  reports that she has never smoked. She has never been exposed to tobacco smoke. She has never used smokeless tobacco. She reports that she does  not currently use alcohol. She reports current drug use.  Allergies:  Allergies  Allergen Reactions   Erythromycin Anaphylaxis and Swelling    Says she throws up and can't breath     Medications: I have reviewed the patient's current medications.   ROS - all of the below systems have been reviewed with the patient and positives are indicated with bold text General: chills, fever or night sweats Eyes: blurry vision or double vision ENT: epistaxis or sore throat Allergy/Immunology: itchy/watery eyes or nasal congestion Hematologic/Lymphatic: bleeding problems, blood clots or swollen lymph nodes Endocrine: temperature intolerance or unexpected weight changes Breast: new or changing breast lumps or nipple discharge Resp: cough,  shortness of breath, or wheezing CV: chest pain or dyspnea on exertion GI: as per HPI GU: dysuria, trouble voiding, or hematuria MSK: joint pain or joint stiffness Neuro: TIA or stroke symptoms Derm: pruritus and skin lesion changes Psych: anxiety and depression  PE Blood pressure 119/71, pulse 81, temperature 98.3 F (36.8 C), temperature source Oral, height '4\' 11"'$  (1.499 m), weight 74.8 kg, last menstrual period 12/04/2021, SpO2 100 %. Constitutional: NAD; conversant; no deformities Eyes: Moist conjunctiva; no lid lag; anicteric; PERRL Neck: Trachea midline; no thyromegaly Lungs: Normal respiratory effort; no tactile fremitus CV: RRR; no palpable thrills; no pitting edema GI: Abd soft, nt, nd; no palpable hepatosplenomegaly MSK: Normal gait; no clubbing/cyanosis Psychiatric: Appropriate affect; alert and oriented x3 Lymphatic: No palpable cervical or axillary lymphadenopathy Skin:no rash/lesions/jaundice  Results for orders placed or performed during the hospital encounter of 12/12/21 (from the past 48 hour(s))  Pregnancy, urine POC     Status: None   Collection Time: 12/12/21  7:58 AM  Result Value Ref Range   Preg Test, Ur NEGATIVE NEGATIVE    Comment:        THE SENSITIVITY OF THIS METHODOLOGY IS >24 mIU/mL     No results found.  Imaging: Reviewed egd and ugi  A/P: Joan Simpson is an 31 y.o. female with  Class I obesity Moderate hiatal hernia with GERD OSA    We rediscussed the steps of the procedure today.  I rediscussed the risk of surgery including but not limited to bleeding, infection, injury to surrounding structures such as the esophagus and lungs and other viscera, blood clot formation, perioperative cardiac and pulmonary events, trouble swallowing such as dysphagia, hiatal hernia recurrence, gas bloat, need for additional procedures.  We rediscussed the typical hospitalization.  We discussed the change in eating techniques and behaviors that is  needed.  We discussed that it can take several months to recover from a GI perspective.  We discussed the potential need for ongoing reflux medication.  We discussed there may be some food textures that are problematic to tolerate long-term. We discussed the different types of wrap such as a partial wrap versus a full wrap.  We discussed the pros and cons of each wrap.  Unfortunately the patient was not able to tolerate preoperative manometry.  Her upper GI showed no obvious dysmotility and she denies any history of dysphagia therefore leaning towards performing a full wrap.  The patient agreed with that plan.  We also talked about pros and cons of absorbable mesh insertion.  We discussed that I would generally only place a piece of absorbable mesh should there be tension on the diaphragm repair     Leighton Ruff. Redmond Pulling, MD, FACS General, Bariatric, & Minimally Invasive Surgery [place]

## 2021-12-12 NOTE — Anesthesia Procedure Notes (Signed)
Procedure Name: Intubation Date/Time: 12/12/2021 9:55 AM  Performed by: Gean Maidens, CRNAPre-anesthesia Checklist: Patient identified, Emergency Drugs available, Suction available, Patient being monitored and Timeout performed Patient Re-evaluated:Patient Re-evaluated prior to induction Oxygen Delivery Method: Circle system utilized Preoxygenation: Pre-oxygenation with 100% oxygen Induction Type: IV induction Ventilation: Mask ventilation without difficulty Laryngoscope Size: Mac and 4 Grade View: Grade I Tube type: Oral Tube size: 7.0 mm Number of attempts: 1 Airway Equipment and Method: Stylet Placement Confirmation: ETT inserted through vocal cords under direct vision, positive ETCO2 and breath sounds checked- equal and bilateral Secured at: 21 cm Tube secured with: Tape Dental Injury: Teeth and Oropharynx as per pre-operative assessment

## 2021-12-12 NOTE — Anesthesia Postprocedure Evaluation (Signed)
Anesthesia Post Note  Patient: Joan Simpson  Procedure(s) Performed: XI ROBOTIC ASSISTED HIATAL HERNIA  REPAIR WITH FUNDOPLICATION UPPER GI ENDOSCOPY     Patient location during evaluation: PACU Anesthesia Type: General Level of consciousness: awake and alert Pain management: pain level controlled Vital Signs Assessment: post-procedure vital signs reviewed and stable Respiratory status: spontaneous breathing, nonlabored ventilation and respiratory function stable Cardiovascular status: blood pressure returned to baseline and stable Postop Assessment: no apparent nausea or vomiting Anesthetic complications: no   No notable events documented.  Last Vitals:  Vitals:   12/12/21 1500 12/12/21 1510  BP:  129/83  Pulse: 81 91  Resp: 17 16  Temp:  36.7 C  SpO2: 94% 100%    Last Pain:  Vitals:   12/12/21 1510  TempSrc: Oral  PainSc: 5                  Hilding Quintanar,W. EDMOND

## 2021-12-13 ENCOUNTER — Observation Stay (HOSPITAL_COMMUNITY): Payer: BC Managed Care – PPO

## 2021-12-13 ENCOUNTER — Other Ambulatory Visit (HOSPITAL_COMMUNITY): Payer: Self-pay

## 2021-12-13 ENCOUNTER — Encounter: Payer: Self-pay | Admitting: Physician Assistant

## 2021-12-13 ENCOUNTER — Encounter (HOSPITAL_COMMUNITY): Payer: Self-pay | Admitting: General Surgery

## 2021-12-13 DIAGNOSIS — K449 Diaphragmatic hernia without obstruction or gangrene: Secondary | ICD-10-CM | POA: Diagnosis not present

## 2021-12-13 LAB — CBC
HCT: 33.4 % — ABNORMAL LOW (ref 36.0–46.0)
Hemoglobin: 10.7 g/dL — ABNORMAL LOW (ref 12.0–15.0)
MCH: 30.7 pg (ref 26.0–34.0)
MCHC: 32 g/dL (ref 30.0–36.0)
MCV: 96 fL (ref 80.0–100.0)
Platelets: 356 10*3/uL (ref 150–400)
RBC: 3.48 MIL/uL — ABNORMAL LOW (ref 3.87–5.11)
RDW: 13.2 % (ref 11.5–15.5)
WBC: 12.2 10*3/uL — ABNORMAL HIGH (ref 4.0–10.5)
nRBC: 0 % (ref 0.0–0.2)

## 2021-12-13 LAB — BASIC METABOLIC PANEL
Anion gap: 6 (ref 5–15)
BUN: <5 mg/dL — ABNORMAL LOW (ref 6–20)
CO2: 20 mmol/L — ABNORMAL LOW (ref 22–32)
Calcium: 8.5 mg/dL — ABNORMAL LOW (ref 8.9–10.3)
Chloride: 115 mmol/L — ABNORMAL HIGH (ref 98–111)
Creatinine, Ser: 0.66 mg/dL (ref 0.44–1.00)
GFR, Estimated: >60 mL/min (ref >60)
Glucose, Bld: 119 mg/dL — ABNORMAL HIGH (ref 70–99)
Potassium: 4.1 mmol/L (ref 3.5–5.1)
Sodium: 141 mmol/L (ref 135–145)

## 2021-12-13 LAB — MAGNESIUM: Magnesium: 1.8 mg/dL (ref 1.7–2.4)

## 2021-12-13 MED ORDER — IOHEXOL 300 MG/ML  SOLN
100.0000 mL | Freq: Once | INTRAMUSCULAR | Status: AC | PRN
  Administered 2021-12-13: 55 mL via ORAL

## 2021-12-13 MED ORDER — ONDANSETRON 8 MG PO TBDP
8.0000 mg | ORAL_TABLET | Freq: Three times a day (TID) | ORAL | 1 refills | Status: DC | PRN
  Filled 2021-12-13: qty 30, 10d supply, fill #0

## 2021-12-13 MED ORDER — RABEPRAZOLE SODIUM 20 MG PO TBEC
20.0000 mg | DELAYED_RELEASE_TABLET | Freq: Two times a day (BID) | ORAL | 0 refills | Status: DC
Start: 2021-12-13 — End: 2022-05-25
  Filled 2021-12-13: qty 60, 30d supply, fill #0

## 2021-12-13 MED ORDER — OXYCODONE HCL 5 MG PO TABS
5.0000 mg | ORAL_TABLET | Freq: Four times a day (QID) | ORAL | 0 refills | Status: DC | PRN
  Filled 2021-12-13: qty 15, 4d supply, fill #0

## 2021-12-13 MED ORDER — ACETAMINOPHEN 500 MG PO TABS: 1000.0000 mg | ORAL_TABLET | Freq: Three times a day (TID) | ORAL | 0 refills | Status: AC

## 2021-12-13 NOTE — Progress Notes (Signed)
Transition of Care [facility]) Screening Note  Patient Details  Name: Joan Simpson Date of Birth: 1990/07/15  [facility] CM/SW Contact:    Sherie Don, LCSW Phone Number: 12/13/2021, 9:31 AM  Transition of Care Department [facility]) has reviewed patient and no TOC needs have been identified at this time. We will continue to monitor patient advancement through interdisciplinary progression rounds. If new patient transition needs arise, please place a TOC consult.

## 2021-12-13 NOTE — Progress Notes (Signed)
Patient was given discharge instructions, and all questions were answered.  Patient was stable for discharge and was taken to the main exit by wheelchair. 

## 2021-12-13 NOTE — Discharge Instructions (Signed)
EATING AFTER YOUR ESOPHAGEAL SURGERY (Stomach Fundoplication, Hiatal Hernia repair, Achalasia surgery, etc)  ######################################################################  EAT Start with a full liquid diet (see below) Gradually transition to a high fiber diet with a fiber supplement over the next month after discharge.    WALK Walk an hour a day.  Control your pain to do that.    CONTROL PAIN Control pain so that you can walk, sleep, tolerate sneezing/coughing, go up/down stairs.  HAVE A BOWEL MOVEMENT DAILY Keep your bowels regular to avoid problems.  OK to try a laxative to override constipation.  OK to use an antidairrheal to slow down diarrhea.  Call if not better after 2 tries  CALL IF YOU HAVE PROBLEMS/CONCERNS Call if you are still struggling despite following these instructions. Call if you have concerns not answered by these instructions  ######################################################################   After your esophageal surgery, expect some sticking with swallowing over the next 1-2 months.    If food sticks when you eat, it is called "dysphagia".  This is due to swelling around your esophagus at the wrap & hiatal diaphragm repair.  It will gradually ease off over the next few months.  To help you through this temporary phase, we start you out on a full liquid diet.  Your first meal in the hospital was thin liquids.  You should have been given a full liquid diet by the time you left the hospital. Stay on clears and full liquids for the first week. Some patients may need to stay on a liquid diet for up to 2 weeks if having trouble swallowing.  Once tolerating that well, you can advance to pureed diet.   We ask patients to stay on a pureed diet for the 2nd-3rd week to avoid anything getting "stuck" near your recent surgery.  Don't be alarmed if your ability to swallow doesn't progress according to this plan.  Everyone is different and some diets can advance  more or less quickly.    It is often helpful to crush your medications or split them as they can sometimes stick, especially the first week or so.   Some BASIC RULES to follow are: Maintain an upright position whenever eating or drinking. Take small bites - just a teaspoon size bite at a time. Eat slowly.  It may also help to eat only one food at a time. Consider nibbling through smaller, more frequent meals & avoid the urge to eat BIG meals Do not push through feelings of fullness, nausea, or bloatedness Do not mix solid foods and liquids in the same mouthful Try not to "wash foods down" with large gulps of liquids. Avoid carbonated (bubbly/fizzy) drinks.   Avoid foods that make you feel gassy or bloated.  Start with bland foods first.  Wait on trying greasy, fried, or spicy meals until you are tolerating more bland solids well. Understand that it will be hard to burp and belch at first.  This gradually improves with time.  Expect to be more gassy/flatulent/bloated initially.  Walking will help your body manage it better. Consider using medications for bloating that contain simethicone such as  Maalox or Gas-X  Consider crushing her medications, especially smaller pills.  The ability to swallow pills should get easier after a few weeks Eat in a relaxed atmosphere & minimize distractions. Avoid talking while eating.   Do not use straws. Following each meal, sit in an upright position (90 degree angle) for 60 to 90 minutes.  Going for a short walk can  help as well If food does stick, don't panic.  Try to relax and let the food pass on its own.  Sipping WARM LIQUID such as strong hot black tea can also help slide it down.   Be gradual in changes & use common sense:  -If you easily tolerating a certain "level" of foods, advance to the next level gradually -If you are having trouble swallowing a particular food, then avoid it.   -If food is sticking when you advance your diet, go back to  thinner previous diet (the lower LEVEL) for 1-2 days.  LEVEL 2 = PUREED DIET  Start 1- 2 WEEKS AFTER SURGERY IF YOU ARE TOLERATING A FULL LIQUID DIET EASILY  -Foods in this group are pureed or blenderized to a smooth, mashed potato-like consistency.  -If necessary, the pureed foods can keep their shape with the addition of a thickening agent.   -Meat should be pureed to a smooth, pasty consistency.  Hot broth or gravy may be added to the pureed meat, approximately 1 oz. of liquid per 3 oz. serving of meat. -CAUTION:  If any foods do not puree into a smooth consistency, swallowing will be more difficult.  (For example, nuts or seeds sometimes do not blend well.)  Hot Foods Cold Foods  Pureed scrambled eggs and cheese Pureed cottage cheese  Baby cereals Thickened juices and nectars  Thinned cooked cereals (no lumps) Thickened milk or eggnog  Pureed [place] toast or pancakes Ensure  Mashed potatoes Ice cream  Pureed parsley, au gratin, scalloped potatoes, candied sweet potatoes Fruit or [place] ice, sherbet  Pureed buttered or alfredo noodles Plain yogurt  Pureed vegetables (no corn or peas) Instant breakfast  Pureed soups and creamed soups Smooth pudding, mousse, custard  Pureed scalloped apples Whipped gelatin  Gravies Sugar, syrup, honey, jelly  Sauces, cheese, tomato, barbecue, white, creamed Cream  Any baby food Creamer  Alcohol in moderation (not beer or champagne) Margarine  Coffee or tea Mayonnaise   Ketchup, mustard   Apple sauce   SAMPLE MENU:  PUREED DIET Breakfast Lunch Dinner  Orange juice, 1/2 cup Cream of wheat, 1/2 cup Pineapple juice, 1/2 cup Pureed [place], barley soup, 3/4 cup Pureed Hawaiian chicken, 3 oz  Scrambled eggs, mashed or blended with cheese, 1/2 cup Tea or coffee, 1 cup  Whole milk, 1 cup  Non-dairy creamer, 2 Tbsp. Mashed potatoes, 1/2 cup Pureed cooled broccoli, 1/2 cup Apple sauce, 1/2 cup Coffee or tea Mashed potatoes, 1/2 cup Pureed spinach,  1/2 cup Frozen yogurt, 1/2 cup Tea or coffee      LEVEL 3 = SOFT DIET  After your first 4 weeks, you can advance to a soft diet.   Keep on this diet until everything goes down easily.  Hot Foods Cold Foods  White fish Cottage cheese  Stuffed fish Junior baby fruit  Baby food meals Semi thickened juices  Minced soft cooked, scrambled, poached eggs nectars  Souffle & omelets Ripe mashed bananas  Cooked cereals Canned fruit, pineapple sauce, milk  potatoes Milkshake  Buttered or Alfredo noodles Custard  Cooked cooled vegetable Puddings, including tapioca  Sherbet Yogurt  Vegetable soup or alphabet soup Fruit ice, [place] ice  Gravies Whipped gelatin  Sugar, syrup, honey, jelly Junior baby desserts  Sauces:  Cheese, creamed, barbecue, tomato, white Cream  Coffee or tea Margarine   SAMPLE MENU:  LEVEL 3 Breakfast Lunch Dinner  Orange juice, 1/2 cup Oatmeal, 1/2 cup Scrambled eggs with cheese, 1/2 cup Decaffeinated tea,  1 cup Whole milk, 1 cup Non-dairy creamer, 2 Tbsp Pineapple juice, 1/2 cup Minced beef, 3 oz Gravy, 2 Tbsp Mashed potatoes, 1/2 cup Minced fresh broccoli, 1/2 cup Applesauce, 1/2 cup Coffee, 1 cup [place], barley soup, 3/4 cup Minced Hawaiian chicken, 3 oz Mashed potatoes, 1/2 cup Cooked spinach, 1/2 cup Frozen yogurt, 1/2 cup Non-dairy creamer, 2 Tbsp      LEVEL 4 = CHOPPED DIET  -After all the foods in level 3 (soft diet) are passing through well you should advance up to more chopped foods.  -It is still important to cut these foods into small pieces and eat slowly.  Hot Foods Cold Foods  Poultry Cottage cheese  Chopped Swedish meatballs Yogurt  Meat salads (ground or flaked meat) Milk  Flaked fish (tuna) Milkshakes  Poached or scrambled eggs Soft, cold, dry cereal  Souffles and omelets Fruit juices or nectars  Cooked cereals Chopped canned fruit  Chopped [place] toast or pancakes Canned fruit cocktail  Noodles or pasta (no rice) Pudding,  mousse, custard  Cooked vegetables (no frozen peas, corn, or mixed vegetables) Green salad  Canned small sweet peas Ice cream  Creamed soup or vegetable soup Fruit ice, [place] ice  Pureed vegetable soup or alphabet soup Non-dairy creamer  Ground scalloped apples Margarine  Gravies Mayonnaise  Sauces:  Cheese, creamed, barbecue, tomato, white Ketchup  Coffee or tea Mustard   SAMPLE MENU:  LEVEL 4 Breakfast Lunch Dinner  Orange juice, 1/2 cup Oatmeal, 1/2 cup Scrambled eggs with cheese, 1/2 cup Decaffeinated tea, 1 cup Whole milk, 1 cup Non-dairy creamer, 2 Tbsp Ketchup, 1 Tbsp Margarine, 1 tsp Salt, 1/4 tsp Sugar, 2 tsp Pineapple juice, 1/2 cup Ground beef, 3 oz Gravy, 2 Tbsp Mashed potatoes, 1/2 cup Cooked spinach, 1/2 cup Applesauce, 1/2 cup Decaffeinated coffee Whole milk Non-dairy creamer, 2 Tbsp Margarine, 1 tsp Salt, 1/4 tsp Pureed [place], barley soup, 3/4 cup Barbecue chicken, 3 oz Mashed potatoes, 1/2 cup Ground fresh broccoli, 1/2 cup Frozen yogurt, 1/2 cup Decaffeinated tea, 1 cup Non-dairy creamer, 2 Tbsp Margarine, 1 tsp Salt, 1/4 tsp Sugar, 1 tsp    LEVEL 5:  REGULAR FOODS  -Foods in this group are soft, moist, regularly textured foods.   -This level includes meat and breads, which tend to be the hardest things to swallow.   -Eat very slowly, chew well and continue to avoid carbonated drinks. -most people are at this level in 6 weeks  Hot Foods Cold Foods  Baked fish or skinned Soft cheeses - cottage cheese  Souffles and omelets Cream cheese  Eggs Yogurt  Stuffed shells Milk  Spaghetti with meat sauce Milkshakes  Cooked cereal Cold dry cereals (no nuts, dried fruit, coconut)  [place] toast or pancakes Crackers  Buttered toast Fruit juices or nectars  Noodles or pasta (no rice) Canned fruit  Potatoes (all types) Ripe bananas  Soft, cooked vegetables (no corn, lima, or baked beans) Peeled, ripe, fresh fruit  Creamed soups or vegetable soup Cakes  (no nuts, dried fruit, coconut)  Canned chicken noodle soup Plain doughnuts  Gravies Ice cream  Bacon dressing Pudding, mousse, custard  Sauces:  Cheese, creamed, barbecue, tomato, white Fruit ice, [place] ice, sherbet  Decaffeinated tea or coffee Whipped gelatin  Pork chops Regular gelatin   Canned fruited gelatin molds   Sugar, syrup, honey, jam, jelly   Cream   Non-dairy   Margarine   Oil   Mayonnaise   Ketchup   Mustard   TROUBLESHOOTING IRREGULAR BOWELS  1) Avoid extremes of bowel movements (no bad constipation/diarrhea)  2) Miralax 17gm mixed in 8oz. water or juice-daily. May use BID as needed.  3) Gas-x,Phazyme, etc. as needed for gas & bloating.  4) Soft,bland diet. No spicy,greasy,fried foods.  5) Prilosec over-the-counter as needed  6) May hold gluten/wheat products from diet to see if symptoms improve.  7) May try probiotics (Align, Activa, etc) to help calm the bowels down  7) If symptoms become worse call back immediately.    If you have any questions please call our office at [place]: [phone].

## 2021-12-14 NOTE — Discharge Summary (Signed)
Physician Discharge Summary  Joan Simpson LZJ:[id number] DOB: 1990/06/16 DOA: 12/12/2021  PCP: Janith Lima, MD  Admit date: 12/12/2021 Discharge date: 12/13/2021  Recommendations for Outpatient Follow-up:     Follow-up Information     Greer Pickerel, MD. Schedule an appointment as soon as possible for a visit in 3 week(s).   Specialty: General Surgery Why: For wound re-check Contact information: [place] [address] [phone]                Discharge Diagnoses:  Large type III hiatal hernia with gerd  Surgical Procedure: XI ROBOTIC ASSISTED HIATAL HERNIA  REPAIR WITH FUNDOPLICATION UPPER GI ENDOSCOPY BILATERAL TAPP BLOCK 12/12/21   Discharge Condition: good Disposition: home  Diet recommendation: full liquids  Filed Weights   12/12/21 0756  Weight: 74.8 kg    History of present illness:   the patient came in for planned robotic repair of moderate to large type III hiatal hernia with GERD.   Hospital Course:  Patient underwent the above-mentioned procedure.  She was kept overnight for observation.  She was maintained on perioperative chemical VTE prophylaxis.  On postoperative day 1 she was tolerating liquids.  She had no fever or tachycardia.  A swallow demonstrated no evidence of leak.  Her diet was advanced to full liquids.  We discussed discharge instructions as well as diet progression and eating techniques and behaviors.  She was deemed stable for discharge  BP (!) 103/50 (BP Location: Right Arm)   Pulse 94   Temp 99.2 F (37.3 C) (Oral)   Resp 17   Ht '4\' 11"'$  (1.499 m)   Wt 74.8 kg   LMP 12/04/2021 Comment: urine preg.negative 12/12/21  SpO2 92%   BMI 33.31 kg/m   Gen: alert, NAD, non-toxic appearing Pupils: equal, no scleral icterus Pulm: Lungs clear to auscultation, symmetric chest rise CV: regular rate and rhythm Abd: soft, mild approp tender, nondistended.  No cellulitis. No incisional hernia Ext: no edema, no  calf tenderness Skin: no rash, no jaundice   Discharge Instructions  Discharge Instructions     Call MD for:   Complete by: As directed    Temperature >101   Call MD for:  hives   Complete by: As directed    Call MD for:  persistant dizziness or light-headedness   Complete by: As directed    Call MD for:  persistant nausea and vomiting   Complete by: As directed    Call MD for:  redness, tenderness, or signs of infection (pain, swelling, redness, odor or green/yellow discharge around incision site)   Complete by: As directed    Call MD for:  severe uncontrolled pain   Complete by: As directed    Diet full liquid   Complete by: As directed    Discharge instructions   Complete by: As directed    See [facility] discharge instructions   Increase activity slowly   Complete by: As directed       Allergies as of 12/13/2021       Reactions   Erythromycin Anaphylaxis, Swelling   Says she throws up and can't breath        Medication List     STOP taking these medications    naproxen sodium 220 MG tablet Commonly known as: Aleve       TAKE these medications    acetaminophen 500 MG tablet Commonly known as: TYLENOL Take 2 tablets (1,000 mg total) by mouth every 8 (eight)  hours for 5 days.   Armodafinil 250 MG tablet Take 250 mg by mouth daily.   buPROPion 300 MG 24 hr tablet Commonly known as: WELLBUTRIN XL Take 300 mg by mouth daily.   cyanocobalamin 1000 MCG/ML injection Commonly known as: VITAMIN B12 Inject 1,000 mcg into the muscle every 30 (thirty) days. What changed: Another medication with the same name was removed. Continue taking this medication, and follow the directions you see here.   dicyclomine 20 MG tablet Commonly known as: BENTYL Take 2 tablets (40 mg total) by mouth as directed. Take 2-4 times daily as needed   etonogestrel 68 MG Impl implant Commonly known as: NEXPLANON 1 each by Subdermal route once.   lamoTRIgine 200 MG tablet Commonly  known as: LAMICTAL Take 200 mg by mouth daily.   Latuda 40 MG Tabs tablet Generic drug: lurasidone Take 40 mg by mouth at bedtime.   metFORMIN 500 MG tablet Commonly known as: GLUCOPHAGE Take 500 mg by mouth 2 (two) times daily.   ondansetron 8 MG disintegrating tablet Commonly known as: ZOFRAN-ODT Dissolve 1 tablet by mouth every 8 hours as needed for nausea or vomiting.   oxyCODONE 5 MG immediate release tablet Commonly known as: Oxy IR/ROXICODONE Take 1 tablet by mouth every 6 hours as needed for severe pain.   RABEprazole 20 MG tablet Commonly known as: Aciphex Take 1 tablet by mouth 2 times daily.   Vitamin D (Ergocalciferol) 1.25 MG (50000 UNIT) Caps capsule Commonly known as: DRISDOL take 1 capsule by mouth every 7 days        Follow-up Information     Greer Pickerel, MD. Schedule an appointment as soon as possible for a visit in 3 week(s).   Specialty: General Surgery Why: For wound re-check Contact information: [place] [address] [phone]                  The results of significant diagnostics from this hospitalization (including imaging, microbiology, ancillary and laboratory) are listed below for reference.    Significant Diagnostic Studies: DG UGI W SINGLE CM (SOL OR THIN BA)  Result Date: 12/13/2021 CLINICAL DATA:  31 year old female history of hernia repair with fundoplication on December 13, 2021. Patient presents for single upper GI study for further evaluation of possible leak EXAM: DG UGI W SINGLE CM TECHNIQUE: Scout radiograph was obtained. Single contrast examination was performed using thin liquid barium. This exam was performed by Rushie Nyhan NP, and was supervised and interpreted by Dr. Richardean Sale. FLUOROSCOPY: Radiation Exposure Index (as provided by the fluoroscopic device): 23.90 mGy Kerma COMPARISON:  Preoperative examination 10/04/2021 FINDINGS: Scout Radiograph: Within normal limits Esophagus:  Expected postoperative changes from hiatal hernia repair with Nissen fundoplication. Smooth narrowing of the esophagus in the region of the wrap. No leakage of contrast material or significant stricture/narrowing noted. Esophageal motility:  Within normal limits. Gastroesophageal reflux:  None visualized. Ingested 33m barium tablet:  Not given Stomach: Incompletely distended without focal abnormality. Expected postoperative changes. Gastric emptying: Normal. Duodenum:  Normal appearance. Other:  None. IMPRESSION: Unremarkable problem-oriented water-soluble upper GI series performed to assess for leak or obstruction status post hernia repair with Nissen fundoplication. No extraluminal contrast is demonstrated to suggest leak. Electronically Signed   By: WRichardean SaleM.D.   On: 12/13/2021 09:52    Microbiology: No results found for this or any previous visit (from the past 240 hour(s)).   Labs: Basic Metabolic Panel: Recent Labs  Lab 12/13/21  0505  NA 141  K 4.1  CL 115*  CO2 20*  GLUCOSE 119*  BUN <5*  CREATININE 0.66  CALCIUM 8.5*  MG 1.8   Liver Function Tests: No results for input(s): "AST", "ALT", "ALKPHOS", "BILITOT", "PROT", "ALBUMIN" in the last 168 hours. No results for input(s): "LIPASE", "AMYLASE" in the last 168 hours. No results for input(s): "AMMONIA" in the last 168 hours. CBC: Recent Labs  Lab 12/13/21 0505  WBC 12.2*  HGB 10.7*  HCT 33.4*  MCV 96.0  PLT 356   Cardiac Enzymes: No results for input(s): "CKTOTAL", "CKMB", "CKMBINDEX", "TROPONINI" in the last 168 hours. BNP: BNP (last 3 results) No results for input(s): "BNP" in the last 8760 hours.  ProBNP (last 3 results) No results for input(s): "PROBNP" in the last 8760 hours.  CBG: No results for input(s): "GLUCAP" in the last 168 hours.  Principal Problem:   S/P Nissen fundoplication (without gastrostomy tube) procedure   Time coordinating discharge: 20 min  Signed:  Gayland Curry, MD  [facility], [place] [phone] 12/14/2021, 2:03 PM

## 2021-12-16 LAB — TYPE AND SCREEN
ABO/RH(D): O POS
Antibody Screen: POSITIVE
PT AG Type: NEGATIVE
Unit division: 0

## 2021-12-16 LAB — BPAM RBC
Blood Product Expiration Date: 202309182359
Unit Type and Rh: 5100

## 2021-12-23 ENCOUNTER — Encounter: Payer: Self-pay | Admitting: Physician Assistant

## 2021-12-29 ENCOUNTER — Other Ambulatory Visit: Payer: Self-pay | Admitting: Hematology and Oncology

## 2021-12-29 ENCOUNTER — Inpatient Hospital Stay: Payer: BC Managed Care – PPO | Attending: Physician Assistant

## 2021-12-29 ENCOUNTER — Inpatient Hospital Stay: Payer: BC Managed Care – PPO

## 2021-12-29 DIAGNOSIS — E538 Deficiency of other specified B group vitamins: Secondary | ICD-10-CM

## 2022-01-26 ENCOUNTER — Inpatient Hospital Stay: Payer: Self-pay | Attending: Physician Assistant | Admitting: Hematology and Oncology

## 2022-01-26 ENCOUNTER — Inpatient Hospital Stay: Payer: Self-pay

## 2022-05-23 ENCOUNTER — Observation Stay (HOSPITAL_COMMUNITY)
Admission: EM | Admit: 2022-05-23 | Discharge: 2022-05-25 | Disposition: A | Discharge status: Home / Self Care | Payer: 59 | Attending: Emergency Medicine | Admitting: Emergency Medicine

## 2022-05-23 ENCOUNTER — Other Ambulatory Visit: Payer: Self-pay

## 2022-05-23 ENCOUNTER — Emergency Department (HOSPITAL_COMMUNITY): Payer: 59

## 2022-05-23 ENCOUNTER — Encounter: Payer: Self-pay | Admitting: Physician Assistant

## 2022-05-23 ENCOUNTER — Encounter (HOSPITAL_COMMUNITY): Payer: Self-pay

## 2022-05-23 DIAGNOSIS — Z7952 Long term (current) use of systemic steroids: Secondary | ICD-10-CM | POA: Insufficient documentation

## 2022-05-23 DIAGNOSIS — Z7982 Long term (current) use of aspirin: Secondary | ICD-10-CM | POA: Diagnosis not present

## 2022-05-23 DIAGNOSIS — R112 Nausea with vomiting, unspecified: Principal | ICD-10-CM | POA: Diagnosis present

## 2022-05-23 DIAGNOSIS — K449 Diaphragmatic hernia without obstruction or gangrene: Secondary | ICD-10-CM | POA: Diagnosis not present

## 2022-05-23 DIAGNOSIS — I10 Essential (primary) hypertension: Secondary | ICD-10-CM | POA: Diagnosis not present

## 2022-05-23 DIAGNOSIS — Z79899 Other long term (current) drug therapy: Secondary | ICD-10-CM | POA: Diagnosis not present

## 2022-05-23 DIAGNOSIS — R1084 Generalized abdominal pain: Secondary | ICD-10-CM | POA: Diagnosis present

## 2022-05-23 DIAGNOSIS — R142 Eructation: Secondary | ICD-10-CM | POA: Diagnosis not present

## 2022-05-23 DIAGNOSIS — Z8616 Personal history of COVID-19: Secondary | ICD-10-CM | POA: Insufficient documentation

## 2022-05-23 DIAGNOSIS — R110 Nausea: Secondary | ICD-10-CM

## 2022-05-23 DIAGNOSIS — Z9889 Other specified postprocedural states: Secondary | ICD-10-CM | POA: Diagnosis present

## 2022-05-23 LAB — CBC WITH DIFFERENTIAL/PLATELET
Abs Immature Granulocytes: 0.03 10*3/uL (ref 0.00–0.07)
Basophils Absolute: 0 10*3/uL (ref 0.0–0.1)
Basophils Relative: 0 %
Eosinophils Absolute: 0 10*3/uL (ref 0.0–0.5)
Eosinophils Relative: 1 %
HCT: 35.1 % — ABNORMAL LOW (ref 36.0–46.0)
Hemoglobin: 11.5 g/dL — ABNORMAL LOW (ref 12.0–15.0)
Immature Granulocytes: 0 %
Lymphocytes Relative: 25 %
Lymphs Abs: 2 10*3/uL (ref 0.7–4.0)
MCH: 30.9 pg (ref 26.0–34.0)
MCHC: 32.8 g/dL (ref 30.0–36.0)
MCV: 94.4 fL (ref 80.0–100.0)
Monocytes Absolute: 0.4 10*3/uL (ref 0.1–1.0)
Monocytes Relative: 5 %
Neutro Abs: 5.4 10*3/uL (ref 1.7–7.7)
Neutrophils Relative %: 69 %
Platelets: 361 10*3/uL (ref 150–400)
RBC: 3.72 MIL/uL — ABNORMAL LOW (ref 3.87–5.11)
RDW: 13.2 % (ref 11.5–15.5)
WBC: 7.9 10*3/uL (ref 4.0–10.5)
nRBC: 0 % (ref 0.0–0.2)

## 2022-05-23 LAB — URINALYSIS, ROUTINE W REFLEX MICROSCOPIC
Bilirubin Urine: NEGATIVE
Glucose, UA: NEGATIVE mg/dL
Ketones, ur: 5 mg/dL — AB
Nitrite: NEGATIVE
Protein, ur: NEGATIVE mg/dL
Specific Gravity, Urine: 1.013 (ref 1.005–1.030)
pH: 7 (ref 5.0–8.0)

## 2022-05-23 LAB — TROPONIN I (HIGH SENSITIVITY)
Troponin I (High Sensitivity): 2 ng/L (ref <18)
Troponin I (High Sensitivity): 5 ng/L (ref <18)

## 2022-05-23 LAB — LIPASE, BLOOD: Lipase: 32 U/L (ref 11–51)

## 2022-05-23 LAB — COMPREHENSIVE METABOLIC PANEL
ALT: 29 U/L (ref 0–44)
AST: 23 U/L (ref 15–41)
Albumin: 3.7 g/dL (ref 3.5–5.0)
Alkaline Phosphatase: 52 U/L (ref 38–126)
Anion gap: 13 (ref 5–15)
BUN: 7 mg/dL (ref 6–20)
CO2: 20 mmol/L — ABNORMAL LOW (ref 22–32)
Calcium: 8.9 mg/dL (ref 8.9–10.3)
Chloride: 107 mmol/L (ref 98–111)
Creatinine, Ser: 0.93 mg/dL (ref 0.44–1.00)
GFR, Estimated: >60 mL/min (ref >60)
Glucose, Bld: 99 mg/dL (ref 70–99)
Potassium: 2.9 mmol/L — ABNORMAL LOW (ref 3.5–5.1)
Sodium: 140 mmol/L (ref 135–145)
Total Bilirubin: 0.3 mg/dL (ref 0.3–1.2)
Total Protein: 6.7 g/dL (ref 6.5–8.1)

## 2022-05-23 LAB — I-STAT BETA HCG BLOOD, ED (MC, WL, AP ONLY): I-stat hCG, quantitative: <5 m[IU]/mL (ref <5)

## 2022-05-23 MED ORDER — ONDANSETRON HCL 4 MG/2ML IJ SOLN
4.0000 mg | Freq: Four times a day (QID) | INTRAMUSCULAR | Status: DC
  Administered 2022-05-23 – 2022-05-25 (×6): 4 mg via INTRAVENOUS
  Filled 2022-05-23 (×8): qty 2

## 2022-05-23 MED ORDER — CEPHALEXIN 250 MG PO CAPS
1000.0000 mg | ORAL_CAPSULE | Freq: Once | ORAL | Status: AC
  Administered 2022-05-23: 1000 mg via ORAL
  Filled 2022-05-23: qty 4

## 2022-05-23 MED ORDER — LACTATED RINGERS IV SOLN: INTRAVENOUS | Status: DC

## 2022-05-23 MED ORDER — ACETAMINOPHEN 650 MG RE SUPP: 650.0000 mg | Freq: Four times a day (QID) | RECTAL | Status: DC | PRN

## 2022-05-23 MED ORDER — HYDROMORPHONE HCL 1 MG/ML IJ SOLN
0.5000 mg | INTRAMUSCULAR | Status: DC | PRN
  Administered 2022-05-24 (×3): 1 mg via INTRAVENOUS
  Filled 2022-05-23 (×3): qty 1

## 2022-05-23 MED ORDER — IOHEXOL 350 MG/ML SOLN
75.0000 mL | Freq: Once | INTRAVENOUS | Status: AC | PRN
  Administered 2022-05-23: 75 mL via INTRAVENOUS

## 2022-05-23 MED ORDER — ENOXAPARIN SODIUM 40 MG/0.4ML IJ SOSY
40.0000 mg | PREFILLED_SYRINGE | INTRAMUSCULAR | Status: DC
  Administered 2022-05-24: 40 mg via SUBCUTANEOUS
  Filled 2022-05-23: qty 0.4

## 2022-05-23 MED ORDER — DEXAMETHASONE SODIUM PHOSPHATE 10 MG/ML IJ SOLN
10.0000 mg | Freq: Once | INTRAMUSCULAR | Status: AC
  Administered 2022-05-23: 10 mg via INTRAVENOUS
  Filled 2022-05-23: qty 1

## 2022-05-23 MED ORDER — ACETAMINOPHEN 325 MG PO TABS: 650.0000 mg | ORAL_TABLET | Freq: Four times a day (QID) | ORAL | Status: DC | PRN

## 2022-05-23 MED ORDER — HYDROMORPHONE HCL 1 MG/ML IJ SOLN
1.0000 mg | Freq: Once | INTRAMUSCULAR | Status: AC
  Administered 2022-05-23: 1 mg via INTRAVENOUS
  Filled 2022-05-23: qty 1

## 2022-05-23 MED ORDER — SODIUM CHLORIDE 0.9 % IV BOLUS
1000.0000 mL | Freq: Once | INTRAVENOUS | Status: AC
  Administered 2022-05-23: 1000 mL via INTRAVENOUS

## 2022-05-23 MED ORDER — ONDANSETRON HCL 4 MG/2ML IJ SOLN
4.0000 mg | Freq: Once | INTRAMUSCULAR | Status: AC
  Administered 2022-05-23: 4 mg via INTRAVENOUS
  Filled 2022-05-23: qty 2

## 2022-05-23 MED ORDER — ONDANSETRON 4 MG PO TBDP
8.0000 mg | ORAL_TABLET | Freq: Once | ORAL | Status: AC
  Administered 2022-05-23: 8 mg via ORAL
  Filled 2022-05-23: qty 2

## 2022-05-23 MED ORDER — PROCHLORPERAZINE EDISYLATE 10 MG/2ML IJ SOLN
10.0000 mg | INTRAMUSCULAR | Status: DC | PRN
  Administered 2022-05-23 – 2022-05-24 (×3): 10 mg via INTRAVENOUS
  Filled 2022-05-23 (×3): qty 2

## 2022-05-23 MED ORDER — POTASSIUM CHLORIDE 10 MEQ/100ML IV SOLN
10.0000 meq | INTRAVENOUS | Status: AC
  Administered 2022-05-23 (×2): 10 meq via INTRAVENOUS
  Filled 2022-05-23 (×2): qty 100

## 2022-05-23 MED ORDER — KETOROLAC TROMETHAMINE 30 MG/ML IJ SOLN
30.0000 mg | Freq: Once | INTRAMUSCULAR | Status: AC
  Administered 2022-05-23: 30 mg via INTRAVENOUS
  Filled 2022-05-23: qty 1

## 2022-05-23 MED ORDER — LORAZEPAM 2 MG/ML IJ SOLN
0.5000 mg | Freq: Once | INTRAMUSCULAR | Status: AC
  Administered 2022-05-23: 0.5 mg via INTRAVENOUS
  Filled 2022-05-23: qty 1

## 2022-05-23 NOTE — ED Triage Notes (Signed)
Pt brought in by EMS: reports abdominal pain, nausea and the inability to vomit x3 days.   '4mg'$  IVP zofran en route.

## 2022-05-23 NOTE — Assessment & Plan Note (Addendum)
CT AP most impressive for recurrent hiatal hernia now larger than prior to the Nissen in Aug. No mention of volvulus, gastric outlet obstruction or distended stomach despite inability to vomit, etc. Per gen surg recs: NPO UGI series in AM Zofran scheduled Compazine PRN refractory nausea Dilaudid prn pain

## 2022-05-23 NOTE — Assessment & Plan Note (Signed)
Recurrent hiatal hernia despite nissen in August.

## 2022-05-23 NOTE — ED Notes (Signed)
Pt began belching after liquid

## 2022-05-23 NOTE — ED Provider Notes (Signed)
[facility] Provider Note   CSN: [id number] Arrival date & time: 05/23/22  1519     History {Add pertinent medical, surgical, social history, OB history to HPI:1} Chief Complaint  Patient presents with   Nausea   Abdominal Pain   HPI Joan Simpson is a 32 y.o. female with with hiatal hernia, GERD, hypertension, bipolar 1, s/p hiatal hernia and Nissen fundoplication in August 1700 presenting for nausea and abdominal pain.  Started Sunday.  States she was burping uncontrollably and has been nauseous.  No vomiting or diarrhea. Last BM was last night. It was nonbloody.  Abdominal pain is generalized.  Also mention that she is having some sharp chest pain and associated shortness of breath.  Chest pain is nonradiating and and it is reproducible.  Denies fever but having some chills.  States she was also recently diagnosed with COVID and finished her Paxlovid on Monday Monday.  Endorsing increased urinary frequency but no other urinary changes.   Abdominal Pain      Home Medications Prior to Admission medications   Medication Sig Start Date End Date Taking? Authorizing Provider  aspirin-acetaminophen-caffeine (EXCEDRIN MIGRAINE) [phone] MG tablet Take 2 tablets by mouth daily as needed for headache.   Yes [provider]  buPROPion (WELLBUTRIN XL) 300 MG 24 hr tablet Take 300 mg by mouth daily. 06/17/21  Yes [provider]  Cyanocobalamin (VITAMIN B-12 PO) Take 1 tablet by mouth daily.   Yes [provider]  etonogestrel (NEXPLANON) 68 MG IMPL implant 1 each by Subdermal route once.   Yes [provider]  lamoTRIgine (LAMICTAL) 200 MG tablet Take 200 mg by mouth daily. 01/13/20  Yes [provider]  ondansetron (ZOFRAN-ODT) 8 MG disintegrating tablet Dissolve 1 tablet by mouth every 8 hours as needed for nausea or vomiting. 12/13/21  Yes Greer Pickerel, MD  nitrofurantoin, macrocrystal-monohydrate,  (MACROBID) 100 MG capsule Take 100 mg by mouth 2 (two) times daily. 5 day course. Patient not taking: Reported on 05/23/2022    [provider]  oxyCODONE (OXY IR/ROXICODONE) 5 MG immediate release tablet Take 1 tablet by mouth every 6 hours as needed for severe pain. Patient not taking: Reported on 05/23/2022 12/13/21   Greer Pickerel, MD  RABEprazole (ACIPHEX) 20 MG tablet Take 1 tablet by mouth 2 times daily. Patient not taking: Reported on 05/23/2022 12/13/21 07/22/22  Greer Pickerel, MD  Vitamin D, Ergocalciferol, (DRISDOL) 1.25 MG (50000 UNIT) CAPS capsule take 1 capsule by mouth every 7 days Patient not taking: Reported on 05/23/2022 11/04/21   Janith Lima, MD  albuterol (PROVENTIL HFA;VENTOLIN HFA) 108 (90 Base) MCG/ACT inhaler Inhale 1-2 puffs into the lungs every 6 (six) hours as needed for wheezing or shortness of breath. Patient not taking: Reported on 07/10/2018 01/01/17 11/01/18  Marney Setting, NP  fluticasone [facility]) 50 MCG/ACT nasal spray Place 2 sprays into both nostrils daily. Patient not taking: Reported on 07/10/2018 01/16/17 11/01/18  Ivar Drape D, PA      Allergies    Erythromycin    Review of Systems   Review of Systems  Gastrointestinal:  Positive for abdominal pain.    Physical Exam   Vitals:   05/23/22 1806 05/23/22 2125  BP: 120/64 99/60  Pulse: 93 83  Resp: 13 17  Temp: 98.9 F (37.2 C)   SpO2: 99% 100%    CONSTITUTIONAL:  well-appearing, NAD NEURO:  Alert and oriented x 3, CN 3-12 grossly intact EYES:  eyes equal and reactive ENT/NECK:  Supple, no stridor  CARDIO:  regular rate and rhythm, appears well-perfused  PULM:  No respiratory distress, CTAB GI/GU:  non-distended, soft, generalized tenderness MSK/SPINE:  No gross deformities, no edema, moves all extremities  SKIN:  no rash, atraumatic   *Additional and/or pertinent findings included in MDM below    ED Results / Procedures / Treatments   Labs (all labs ordered are listed, but  only abnormal results are displayed) Labs Reviewed  CBC WITH DIFFERENTIAL/PLATELET - Abnormal; Notable for the following components:      Result Value   RBC 3.72 (*)    Hemoglobin 11.5 (*)    HCT 35.1 (*)    All other components within normal limits  COMPREHENSIVE METABOLIC PANEL - Abnormal; Notable for the following components:   Potassium 2.9 (*)    CO2 20 (*)    All other components within normal limits  URINALYSIS, ROUTINE W REFLEX MICROSCOPIC - Abnormal; Notable for the following components:   APPearance HAZY (*)    Hgb urine dipstick LARGE (*)    Ketones, ur 5 (*)    Leukocytes,Ua SMALL (*)    Bacteria, UA RARE (*)    All other components within normal limits  LIPASE, BLOOD  I-STAT BETA HCG BLOOD, ED ([facility], [facility], [facility] ONLY)  TROPONIN I (HIGH SENSITIVITY)  TROPONIN I (HIGH SENSITIVITY)    EKG EKG Interpretation  Date/Time:  Tuesday May 23 2022 15:27:05 EST Ventricular Rate:  99 PR Interval:  155 QRS Duration: 93 QT Interval:  346 QTC Calculation: 444 R Axis:   77 Text Interpretation: Sinus rhythm Probable left atrial enlargement Borderline T wave abnormalities Abnormal ECG Confirmed by Carmin Muskrat [phone]) on 05/23/2022 3:29:01 PM  Radiology CT CHEST ABDOMEN PELVIS W CONTRAST  Result Date: 05/23/2022 CLINICAL DATA:  Possible hiatal hernia on radiography, chest pain. EXAM: CT CHEST, ABDOMEN, AND PELVIS WITH CONTRAST TECHNIQUE: Multidetector CT imaging of the chest, abdomen and pelvis was performed following the standard protocol during bolus administration of intravenous contrast. RADIATION DOSE REDUCTION: This exam was performed according to the departmental dose-optimization program which includes automated exposure control, adjustment of the mA and/or kV according to patient size and/or use of iterative reconstruction technique. CONTRAST:  43m OMNIPAQUE IOHEXOL 350 MG/ML SOLN COMPARISON:  02/03/2021 FINDINGS: CT CHEST FINDINGS Cardiovascular: Heart size normal. No  pericardial effusion. Great vessels unremarkable. Mediastinum/Nodes: There is a moderate hiatal hernia, increased in size since prior study, involving the gastric fundus and cardia. No adenopathy. Lungs/Pleura: No pleural effusion. No pneumothorax. Subsegmental atelectasis medially in the left lower lobe adjacent to the hiatal hernia. Lungs otherwise clear. Musculoskeletal: No chest wall mass or suspicious bone lesions identified. CT ABDOMEN PELVIS FINDINGS Hepatobiliary: No focal liver abnormality is seen. No gallstones, gallbladder wall thickening, or biliary dilatation. Pancreas: Unremarkable. No pancreatic ductal dilatation or surrounding inflammatory changes. Spleen: Normal in size without focal abnormality. Adrenals/Urinary Tract: No adrenal mass. 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 Urinary bladder incompletely distended. Stomach/Bowel: Stomach is decompressed, fundus and cardia involved in moderate hiatal hernia. Small bowel is nondistended. Normal appendix. Colon is partially distended by gas and fecal material, without acute finding. Vascular/Lymphatic: No significant vascular findings are present. No enlarged abdominal or pelvic lymph nodes. Reproductive: Uterus and bilateral adnexa are unremarkable. Other: No ascites.  No free air. Musculoskeletal: No acute or significant osseous findings. IMPRESSION: 1. Moderate hiatal hernia, increased in size since prior study. 2. Horseshoe kidney. Electronically Signed   By: DKeturah Barre  Vernard Gambles M.D.   On: 05/23/2022 19:46   DG Chest 2 View  Result Date: 05/23/2022 CLINICAL DATA:  Hiatal hernia repair December 13, 2021. Chest pain. Burping. Shortness of breath. EXAM: CHEST - 2 VIEW COMPARISON:  Upper GI December 13, 2021.  Upper GI October 04, 2021. FINDINGS: A rounded collection of air is identified posterior to the left side of the heart with an air-fluid level on the lateral view. Probable mild adjacent atelectasis. The heart, hila, mediastinum, lungs, and  pleura are otherwise unremarkable. IMPRESSION: A rounded collection of air is identified posterior to the left side of the heart with an air-fluid level on the lateral view is nonspecific. However, a recurrent hiatal hernia is favored. Recommend a CT scan of the chest for further evaluation. Electronically Signed   By: Dorise Bullion III M.D.   On: 05/23/2022 16:23    Procedures Procedures  {Document cardiac monitor, telemetry assessment procedure when appropriate:1}  Medications Ordered in ED Medications  ondansetron (ZOFRAN) injection 4 mg (4 mg Intravenous Given 05/23/22 1608)  sodium chloride 0.9 % bolus 1,000 mL (0 mLs Intravenous Stopped 05/23/22 1715)  ketorolac (TORADOL) 30 MG/ML injection 30 mg (30 mg Intravenous Given 05/23/22 1713)  potassium chloride 10 mEq in 100 mL IVPB (0 mEq Intravenous Stopped 05/23/22 1944)  iohexol (OMNIPAQUE) 350 MG/ML injection 75 mL (75 mLs Intravenous Contrast Given 05/23/22 1929)  ondansetron (ZOFRAN-ODT) disintegrating tablet 8 mg (8 mg Oral Given 05/23/22 2046)  cephALEXin (KEFLEX) capsule 1,000 mg (1,000 mg Oral Given 05/23/22 2050)  HYDROmorphone (DILAUDID) injection 1 mg (1 mg Intravenous Given 05/23/22 2047)  dexamethasone (DECADRON) injection 10 mg (10 mg Intravenous Given 05/23/22 2048)  LORazepam (ATIVAN) injection 0.5 mg (0.5 mg Intravenous Given 05/23/22 2127)    ED Course/ Medical Decision Making/ A&P   {   Click here for ABCD2, HEART and other calculatorsREFRESH Note before signing :1}                          Medical Decision Making Amount and/or Complexity of Data Reviewed Labs: ordered. Radiology: ordered.  Risk Prescription drug management.   ***  {Document critical care time when appropriate:1} {Document review of labs and clinical decision tools ie heart score, Chads2Vasc2 etc:1}  {Document your independent review of radiology images, and any outside records:1} {Document your discussion with family members, caretakers, and with  consultants:1} {Document social determinants of health affecting pt's care:1} {Document your decision making why or why not admission, treatments were needed:1} Final Clinical Impression(s) / ED Diagnoses Final diagnoses:  None    Rx / DC Orders ED Discharge Orders     None

## 2022-05-23 NOTE — ED Notes (Signed)
ED TO INPATIENT HANDOFF REPORT  ED Nurse Name and Phone #: Janett Billow [phone]  S Name/Age/Gender Joan Simpson 32 y.o. female Room/Bed: 032C/032C  Code Status   Code Status: Full Code  Home/SNF/Other Home Patient oriented to: self, place, time, and situation Is this baseline? Yes   Triage Complete: Triage complete  Chief Complaint Intractable nausea and vomiting [R11.2]  Triage Note Pt brought in by EMS: reports abdominal pain, nausea and the inability to vomit x3 days.   '4mg'$  IVP zofran en route.    Allergies Allergies  Allergen Reactions   Erythromycin Anaphylaxis and Swelling    Says she throws up and can't breath     Level of Care/Admitting Diagnosis ED Disposition     ED Disposition  Admit   Condition  --   Comment  Hospital Area: [facility] [[id number]]  Level of Care: Med-Surg [16]  May place patient in observation at [facility] or [facility] if equivalent level of care is available:: No  Covid Evaluation: Asymptomatic - no recent exposure (last 10 days) testing not required  Diagnosis: Intractable nausea and vomiting [240973]  Admitting Physician: Etta Quill [[id number]]  Attending Physician: Etta Quill [phone]          B Medical/Surgery History Past Medical History:  Diagnosis Date   Allergy    Anemia    Anxiety    Bipolar 1 disorder ([place])    Depression    Hypertension    Nexplanon in place 12/09/2021   left upper arm, use caution when using left upper arm, no B/P cuff to that area please   Pre-diabetes    Past Surgical History:  Procedure Laterality Date   NO PAST SURGERIES     UPPER GI ENDOSCOPY N/A 12/12/2021   Procedure: UPPER GI ENDOSCOPY;  Surgeon: Greer Pickerel, MD;  Location: [facility];  Service: General;  Laterality: N/A;   XI ROBOTIC ASSISTED HIATAL HERNIA REPAIR N/A 12/12/2021   Procedure: XI ROBOTIC ASSISTED HIATAL HERNIA  REPAIR WITH FUNDOPLICATION;  Surgeon: Greer Pickerel, MD;  Location: [facility];  Service:  General;  Laterality: N/A;     A IV Location/Drains/Wounds Patient Lines/Drains/Airways Status     Active Line/Drains/Airways     Name Placement date Placement time Site Days   Peripheral IV 05/23/22 20 G Anterior;Left;Proximal Forearm 05/23/22  --  Forearm  less than 1   Incision (Closed) 12/12/21 Abdomen 12/12/21  1225  -- 162   Incision - 6 Ports Abdomen 1: Umbilicus 2: Upper 3: Left 4: Umbilicus;Right 5: Left;Lateral 6: Right 12/12/21  1020  -- 162            Intake/Output Last 24 hours  Intake/Output Summary (Last 24 hours) at 05/23/2022 2353 Last data filed at 05/23/2022 1944 Gross per 24 hour  Intake 1100 ml  Output --  Net 1100 ml    Labs/Imaging Results for orders placed or performed during the hospital encounter of 05/23/22 (from the past 48 hour(s))  CBC with Differential     Status: Abnormal   Collection Time: 05/23/22  3:43 PM  Result Value Ref Range   WBC 7.9 4.0 - 10.5 K/uL   RBC 3.72 (L) 3.87 - 5.11 MIL/uL   Hemoglobin 11.5 (L) 12.0 - 15.0 g/dL   HCT 35.1 (L) 36.0 - 46.0 %   MCV 94.4 80.0 - 100.0 fL   MCH 30.9 26.0 - 34.0 pg   MCHC 32.8 30.0 - 36.0 g/dL   RDW 13.2 11.5 - 15.5 %  Platelets 361 150 - 400 K/uL   nRBC 0.0 0.0 - 0.2 %   Neutrophils Relative % 69 %   Neutro Abs 5.4 1.7 - 7.7 K/uL   Lymphocytes Relative 25 %   Lymphs Abs 2.0 0.7 - 4.0 K/uL   Monocytes Relative 5 %   Monocytes Absolute 0.4 0.1 - 1.0 K/uL   Eosinophils Relative 1 %   Eosinophils Absolute 0.0 0.0 - 0.5 K/uL   Basophils Relative 0 %   Basophils Absolute 0.0 0.0 - 0.1 K/uL   Immature Granulocytes 0 %   Abs Immature Granulocytes 0.03 0.00 - 0.07 K/uL    Comment: Performed at [facility] [address], [place], [place] [address]  Comprehensive metabolic panel     Status: Abnormal   Collection Time: 05/23/22  3:43 PM  Result Value Ref Range   Sodium 140 135 - 145 mmol/L   Potassium 2.9 (L) 3.5 - 5.1 mmol/L   Chloride 107 98 - 111 mmol/L   CO2 20 (L) 22 - 32  mmol/L   Glucose, Bld 99 70 - 99 mg/dL    Comment: Glucose reference range applies only to samples taken after fasting for at least 8 hours.   BUN 7 6 - 20 mg/dL   Creatinine, Ser 0.93 0.44 - 1.00 mg/dL   Calcium 8.9 8.9 - 10.3 mg/dL   Total Protein 6.7 6.5 - 8.1 g/dL   Albumin 3.7 3.5 - 5.0 g/dL   AST 23 15 - 41 U/L   ALT 29 0 - 44 U/L   Alkaline Phosphatase 52 38 - 126 U/L   Total Bilirubin 0.3 0.3 - 1.2 mg/dL   GFR, Estimated >60 >60 mL/min    Comment: (NOTE) Calculated using the CKD-EPI Creatinine Equation (2021)    Anion gap 13 5 - 15    Comment: Performed at [facility] [address], [place], [place] [address]  Lipase, blood     Status: None   Collection Time: 05/23/22  3:43 PM  Result Value Ref Range   Lipase 32 11 - 51 U/L    Comment: Performed at [facility], [place] [address] [place], [place] [address]  Troponin I (High Sensitivity)     Status: None   Collection Time: 05/23/22  3:43 PM  Result Value Ref Range   Troponin I (High Sensitivity) 2 <18 ng/L    Comment: (NOTE) Elevated high sensitivity troponin I (hsTnI) values and significant  changes across serial measurements may suggest ACS but many other  chronic and acute conditions are known to elevate hsTnI results.  Refer to the "Links" section for chest pain algorithms and additional  guidance. Performed at [facility], [place] [address] [place], [place] [address]   I-Stat Beta hCG blood, ED ([facility], [facility], [facility] only)     Status: None   Collection Time: 05/23/22  4:07 PM  Result Value Ref Range   I-stat hCG, quantitative <5.0 <5 mIU/mL   Comment 3            Comment:   GEST. AGE      CONC.  (mIU/mL)   <=1 WEEK        5 - 50     2 WEEKS       50 - 500     3 WEEKS       100 - 10,000     4 WEEKS     1,000 - 30,000  FEMALE AND NON-PREGNANT FEMALE:     LESS THAN 5 mIU/mL   Urinalysis, Routine w reflex microscopic -Urine, Clean Catch     Status: Abnormal   Collection Time: 05/23/22  5:05 PM   Result Value Ref Range   Color, Urine YELLOW YELLOW   APPearance HAZY (A) CLEAR   Specific Gravity, Urine 1.013 1.005 - 1.030   pH 7.0 5.0 - 8.0   Glucose, UA NEGATIVE NEGATIVE mg/dL   Hgb urine dipstick LARGE (A) NEGATIVE   Bilirubin Urine NEGATIVE NEGATIVE   Ketones, ur 5 (A) NEGATIVE mg/dL   Protein, ur NEGATIVE NEGATIVE mg/dL   Nitrite NEGATIVE NEGATIVE   Leukocytes,Ua SMALL (A) NEGATIVE   RBC / HPF 0-5 0 - 5 RBC/hpf   WBC, UA 6-10 0 - 5 WBC/hpf   Bacteria, UA RARE (A) NONE SEEN   Squamous Epithelial / HPF 11-20 0 - 5 /HPF   Mucus PRESENT     Comment: Performed at [facility], [address] [place], [place] [address]  Troponin I (High Sensitivity)     Status: None   Collection Time: 05/23/22  5:57 PM  Result Value Ref Range   Troponin I (High Sensitivity) 5 <18 ng/L    Comment: (NOTE) Elevated high sensitivity troponin I (hsTnI) values and significant  changes across serial measurements may suggest ACS but many other  chronic and acute conditions are known to elevate hsTnI results.  Refer to the "Links" section for chest pain algorithms and additional  guidance. Performed at [facility], [place] [address], [place], [place] [address]    CT CHEST ABDOMEN PELVIS W CONTRAST  Result Date: 05/23/2022 CLINICAL DATA:  Possible hiatal hernia on radiography, chest pain. EXAM: CT CHEST, ABDOMEN, AND PELVIS WITH CONTRAST TECHNIQUE: Multidetector CT imaging of the chest, abdomen and pelvis was performed following the standard protocol during bolus administration of intravenous contrast. RADIATION DOSE REDUCTION: This exam was performed according to the departmental dose-optimization program which includes automated exposure control, adjustment of the mA and/or kV according to patient size and/or use of iterative reconstruction technique. CONTRAST:  75m OMNIPAQUE IOHEXOL 350 MG/ML SOLN COMPARISON:  02/03/2021 FINDINGS: CT CHEST FINDINGS Cardiovascular: Heart size normal. No  pericardial effusion. Great vessels unremarkable. Mediastinum/Nodes: There is a moderate hiatal hernia, increased in size since prior study, involving the gastric fundus and cardia. No adenopathy. Lungs/Pleura: No pleural effusion. No pneumothorax. Subsegmental atelectasis medially in the left lower lobe adjacent to the hiatal hernia. Lungs otherwise clear. Musculoskeletal: No chest wall mass or suspicious bone lesions identified. CT ABDOMEN PELVIS FINDINGS Hepatobiliary: No focal liver abnormality is seen. No gallstones, gallbladder wall thickening, or biliary dilatation. Pancreas: Unremarkable. No pancreatic ductal dilatation or surrounding inflammatory changes. Spleen: Normal in size without focal abnormality. Adrenals/Urinary Tract: No adrenal mass. 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 Urinary bladder incompletely distended. Stomach/Bowel: Stomach is decompressed, fundus and cardia involved in moderate hiatal hernia. Small bowel is nondistended. Normal appendix. Colon is partially distended by gas and fecal material, without acute finding. Vascular/Lymphatic: No significant vascular findings are present. No enlarged abdominal or pelvic lymph nodes. Reproductive: Uterus and bilateral adnexa are unremarkable. Other: No ascites.  No free air. Musculoskeletal: No acute or significant osseous findings. IMPRESSION: 1. Moderate hiatal hernia, increased in size since prior study. 2. Horseshoe kidney. Electronically Signed   By: DLucrezia EuropeM.D.   On: 05/23/2022 19:46   DG Chest 2 View  Result Date: 05/23/2022 CLINICAL DATA:  Hiatal hernia repair  December 13, 2021. Chest pain. Burping. Shortness of breath. EXAM: CHEST - 2 VIEW COMPARISON:  Upper GI December 13, 2021.  Upper GI October 04, 2021. FINDINGS: A rounded collection of air is identified posterior to the left side of the heart with an air-fluid level on the lateral view. Probable mild adjacent atelectasis. The heart, hila, mediastinum, lungs, and  pleura are otherwise unremarkable. IMPRESSION: A rounded collection of air is identified posterior to the left side of the heart with an air-fluid level on the lateral view is nonspecific. However, a recurrent hiatal hernia is favored. Recommend a CT scan of the chest for further evaluation. Electronically Signed   By: Dorise Bullion III M.D.   On: 05/23/2022 16:23    Pending Labs Unresulted Labs (From admission, onward)     Start     Ordered   05/24/22 0500  CBC  Tomorrow morning,   R        05/23/22 2323   05/24/22 0500  Comprehensive metabolic panel  Tomorrow morning,   R        05/23/22 2323   05/23/22 2321  HIV Antibody (routine testing w rflx)  (HIV Antibody (Routine testing w reflex) panel)  Once,   R        05/23/22 2323            Vitals/Pain Today's Vitals   05/23/22 2125 05/23/22 2200 05/23/22 2203 05/23/22 2324  BP: 99/60 105/68    Pulse: 83 94    Resp: 17 14    Temp:   97.9 F (36.6 C)   TempSrc:   Oral   SpO2: 100% 98%    Weight:      Height:      PainSc:    Asleep    Isolation Precautions No active isolations  Medications Medications  ondansetron (ZOFRAN) injection 4 mg (4 mg Intravenous Given 05/23/22 2347)  prochlorperazine (COMPAZINE) injection 10 mg (10 mg Intravenous Given 05/23/22 2251)  lactated ringers infusion ( Intravenous New Bag/Given 05/23/22 2347)  enoxaparin (LOVENOX) injection 40 mg (has no administration in time range)  acetaminophen (TYLENOL) tablet 650 mg (has no administration in time range)    Or  acetaminophen (TYLENOL) suppository 650 mg (has no administration in time range)  HYDROmorphone (DILAUDID) injection 0.5-1 mg (has no administration in time range)  ondansetron (ZOFRAN) injection 4 mg (4 mg Intravenous Given 05/23/22 1608)  sodium chloride 0.9 % bolus 1,000 mL (0 mLs Intravenous Stopped 05/23/22 1715)  ketorolac (TORADOL) 30 MG/ML injection 30 mg (30 mg Intravenous Given 05/23/22 1713)  potassium chloride 10 mEq in 100 mL IVPB (0  mEq Intravenous Stopped 05/23/22 1944)  iohexol (OMNIPAQUE) 350 MG/ML injection 75 mL (75 mLs Intravenous Contrast Given 05/23/22 1929)  ondansetron (ZOFRAN-ODT) disintegrating tablet 8 mg (8 mg Oral Given 05/23/22 2046)  cephALEXin (KEFLEX) capsule 1,000 mg (1,000 mg Oral Given 05/23/22 2050)  HYDROmorphone (DILAUDID) injection 1 mg (1 mg Intravenous Given 05/23/22 2047)  dexamethasone (DECADRON) injection 10 mg (10 mg Intravenous Given 05/23/22 2048)  LORazepam (ATIVAN) injection 0.5 mg (0.5 mg Intravenous Given 05/23/22 2127)    Mobility walks     Focused Assessments GI, recurrent nausea    R Recommendations: See Admitting Provider Note  Report given to:   Additional

## 2022-05-23 NOTE — ED Notes (Signed)
Pt able to walk with RN to and from the bathroom, x1 assist

## 2022-05-23 NOTE — Consult Note (Signed)
Reason for Consult:nausea, belching Referring Physician: Dr Treasa School is an 32 y.o. female.  HPI: 75 yof who underwent HH repair/nissen by Dr Redmond Pulling 8/23.  She was doing well, had some occasional belching. Had covid not long ago with coughing.  Since Sunday she has had nausea. She has eaten some food even tonight and has drank some water, she cannot vomit.  Having some bowel function.  Some pain in chest.  She had ct scan that showed likely recurrence of hh and I was called  Past Medical History:  Diagnosis Date   Allergy    Anemia    Anxiety    Bipolar 1 disorder ([place])    Depression    Hypertension    Nexplanon in place 12/09/2021   left upper arm, use caution when using left upper arm, no B/P cuff to that area please   Pre-diabetes     Past Surgical History:  Procedure Laterality Date   NO PAST SURGERIES     UPPER GI ENDOSCOPY N/A 12/12/2021   Procedure: UPPER GI ENDOSCOPY;  Surgeon: Greer Pickerel, MD;  Location: [facility];  Service: General;  Laterality: N/A;   XI ROBOTIC ASSISTED HIATAL HERNIA REPAIR N/A 12/12/2021   Procedure: XI ROBOTIC ASSISTED HIATAL HERNIA  REPAIR WITH FUNDOPLICATION;  Surgeon: Greer Pickerel, MD;  Location: [facility];  Service: General;  Laterality: N/A;    Family History  Problem Relation Age of Onset   Diabetes Mother    Hypertension Mother    Heart disease Mother    Other Father        prediabetes   Hypertension Father    Diabetes Brother    Heart disease Brother    Diabetes Paternal Grandmother    Colon cancer Neg Hx    Esophageal cancer Neg Hx    Inflammatory bowel disease Neg Hx    Liver disease Neg Hx    Pancreatic cancer Neg Hx    Rectal cancer Neg Hx    Stomach cancer Neg Hx     Social History:  reports that she has never smoked. She has never been exposed to tobacco smoke. She has never used smokeless tobacco. She reports that she does not currently use alcohol. She reports current drug use.  Allergies:  Allergies   Allergen Reactions   Erythromycin Anaphylaxis and Swelling    Says she throws up and can't breath     Current Facility-Administered Medications  Medication Dose Route Frequency Provider Last Rate Last Admin   [START ON 05/24/2022] ondansetron (ZOFRAN) injection 4 mg  4 mg Intravenous Q6H Rolm Bookbinder, MD       prochlorperazine (COMPAZINE) injection 10 mg  10 mg Intravenous Q4H PRN Rolm Bookbinder, MD       Current Outpatient Medications  Medication Sig Dispense Refill   aspirin-acetaminophen-caffeine (EXCEDRIN MIGRAINE) 250-250-65 MG tablet Take 2 tablets by mouth daily as needed for headache.     buPROPion (WELLBUTRIN XL) 300 MG 24 hr tablet Take 300 mg by mouth daily.     Cyanocobalamin (VITAMIN B-12 PO) Take 1 tablet by mouth daily.     etonogestrel (NEXPLANON) 68 MG IMPL implant 1 each by Subdermal route once.     lamoTRIgine (LAMICTAL) 200 MG tablet Take 200 mg by mouth daily.     ondansetron (ZOFRAN-ODT) 8 MG disintegrating tablet Dissolve 1 tablet by mouth every 8 hours as needed for nausea or vomiting. 30 tablet 1   nitrofurantoin, macrocrystal-monohydrate, (MACROBID) 100 MG capsule Take 100 mg by  mouth 2 (two) times daily. 5 day course. (Patient not taking: Reported on 05/23/2022)     oxyCODONE (OXY IR/ROXICODONE) 5 MG immediate release tablet Take 1 tablet by mouth every 6 hours as needed for severe pain. (Patient not taking: Reported on 05/23/2022) 15 tablet 0   RABEprazole (ACIPHEX) 20 MG tablet Take 1 tablet by mouth 2 times daily. (Patient not taking: Reported on 05/23/2022) 60 tablet 0   Vitamin D, Ergocalciferol, (DRISDOL) 1.25 MG (50000 UNIT) CAPS capsule take 1 capsule by mouth every 7 days (Patient not taking: Reported on 05/23/2022) 5 capsule 0     Results for orders placed or performed during the hospital encounter of 05/23/22 (from the past 48 hour(s))  CBC with Differential     Status: Abnormal   Collection Time: 05/23/22  3:43 PM  Result Value Ref Range   WBC  7.9 4.0 - 10.5 K/uL   RBC 3.72 (L) 3.87 - 5.11 MIL/uL   Hemoglobin 11.5 (L) 12.0 - 15.0 g/dL   HCT 35.1 (L) 36.0 - 46.0 %   MCV 94.4 80.0 - 100.0 fL   MCH 30.9 26.0 - 34.0 pg   MCHC 32.8 30.0 - 36.0 g/dL   RDW 13.2 11.5 - 15.5 %   Platelets 361 150 - 400 K/uL   nRBC 0.0 0.0 - 0.2 %   Neutrophils Relative % 69 %   Neutro Abs 5.4 1.7 - 7.7 K/uL   Lymphocytes Relative 25 %   Lymphs Abs 2.0 0.7 - 4.0 K/uL   Monocytes Relative 5 %   Monocytes Absolute 0.4 0.1 - 1.0 K/uL   Eosinophils Relative 1 %   Eosinophils Absolute 0.0 0.0 - 0.5 K/uL   Basophils Relative 0 %   Basophils Absolute 0.0 0.0 - 0.1 K/uL   Immature Granulocytes 0 %   Abs Immature Granulocytes 0.03 0.00 - 0.07 K/uL    Comment: Performed at [facility], [address], [place], [place] [address]  Comprehensive metabolic panel     Status: Abnormal   Collection Time: 05/23/22  3:43 PM  Result Value Ref Range   Sodium 140 135 - 145 mmol/L   Potassium 2.9 (L) 3.5 - 5.1 mmol/L   Chloride 107 98 - 111 mmol/L   CO2 20 (L) 22 - 32 mmol/L   Glucose, Bld 99 70 - 99 mg/dL    Comment: Glucose reference range applies only to samples taken after fasting for at least 8 hours.   BUN 7 6 - 20 mg/dL   Creatinine, Ser 0.93 0.44 - 1.00 mg/dL   Calcium 8.9 8.9 - 10.3 mg/dL   Total Protein 6.7 6.5 - 8.1 g/dL   Albumin 3.7 3.5 - 5.0 g/dL   AST 23 15 - 41 U/L   ALT 29 0 - 44 U/L   Alkaline Phosphatase 52 38 - 126 U/L   Total Bilirubin 0.3 0.3 - 1.2 mg/dL   GFR, Estimated >60 >60 mL/min    Comment: (NOTE) Calculated using the CKD-EPI Creatinine Equation (2021)    Anion gap 13 5 - 15    Comment: Performed at [facility] [address], [place], [place] [address]  Lipase, blood     Status: None   Collection Time: 05/23/22  3:43 PM  Result Value Ref Range   Lipase 32 11 - 51 U/L    Comment: Performed at [facility], [place] [address], [place], [place] [address]  Troponin I (High Sensitivity)  Status: None    Collection Time: 05/23/22  3:43 PM  Result Value Ref Range   Troponin I (High Sensitivity) 2 <18 ng/L    Comment: (NOTE) Elevated high sensitivity troponin I (hsTnI) values and significant  changes across serial measurements may suggest ACS but many other  chronic and acute conditions are known to elevate hsTnI results.  Refer to the "Links" section for chest pain algorithms and additional  guidance. Performed at [facility], [place] [address] [place], [place] [address]   I-Stat Beta hCG blood, ED ([facility], [facility], [facility] only)     Status: None   Collection Time: 05/23/22  4:07 PM  Result Value Ref Range   I-stat hCG, quantitative <5.0 <5 mIU/mL   Comment 3            Comment:   GEST. AGE      CONC.  (mIU/mL)   <=1 WEEK        5 - 50     2 WEEKS       50 - 500     3 WEEKS       100 - 10,000     4 WEEKS     1,000 - 30,000        FEMALE AND NON-PREGNANT FEMALE:     LESS THAN 5 mIU/mL   Urinalysis, Routine w reflex microscopic -Urine, Clean Catch     Status: Abnormal   Collection Time: 05/23/22  5:05 PM  Result Value Ref Range   Color, Urine YELLOW YELLOW   APPearance HAZY (A) CLEAR   Specific Gravity, Urine 1.013 1.005 - 1.030   pH 7.0 5.0 - 8.0   Glucose, UA NEGATIVE NEGATIVE mg/dL   Hgb urine dipstick LARGE (A) NEGATIVE   Bilirubin Urine NEGATIVE NEGATIVE   Ketones, ur 5 (A) NEGATIVE mg/dL   Protein, ur NEGATIVE NEGATIVE mg/dL   Nitrite NEGATIVE NEGATIVE   Leukocytes,Ua SMALL (A) NEGATIVE   RBC / HPF 0-5 0 - 5 RBC/hpf   WBC, UA 6-10 0 - 5 WBC/hpf   Bacteria, UA RARE (A) NONE SEEN   Squamous Epithelial / HPF 11-20 0 - 5 /HPF   Mucus PRESENT     Comment: Performed at [facility], [address] [place], [place] [address]  Troponin I (High Sensitivity)     Status: None   Collection Time: 05/23/22  5:57 PM  Result Value Ref Range   Troponin I (High Sensitivity) 5 <18 ng/L    Comment: (NOTE) Elevated high sensitivity troponin I (hsTnI) values and significant  changes  across serial measurements may suggest ACS but many other  chronic and acute conditions are known to elevate hsTnI results.  Refer to the "Links" section for chest pain algorithms and additional  guidance. Performed at [facility], [place] [address] [place], [place] [address]     CT CHEST ABDOMEN PELVIS W CONTRAST  Result Date: 05/23/2022 CLINICAL DATA:  Possible hiatal hernia on radiography, chest pain. EXAM: CT CHEST, ABDOMEN, AND PELVIS WITH CONTRAST TECHNIQUE: Multidetector CT imaging of the chest, abdomen and pelvis was performed following the standard protocol during bolus administration of intravenous contrast. RADIATION DOSE REDUCTION: This exam was performed according to the departmental dose-optimization program which includes automated exposure control, adjustment of the mA and/or kV according to patient size and/or use of iterative reconstruction technique. CONTRAST:  64m OMNIPAQUE IOHEXOL 350 MG/ML SOLN COMPARISON:  02/03/2021 FINDINGS: CT CHEST FINDINGS Cardiovascular: Heart size normal. No pericardial  effusion. Great vessels unremarkable. Mediastinum/Nodes: There is a moderate hiatal hernia, increased in size since prior study, involving the gastric fundus and cardia. No adenopathy. Lungs/Pleura: No pleural effusion. No pneumothorax. Subsegmental atelectasis medially in the left lower lobe adjacent to the hiatal hernia. Lungs otherwise clear. Musculoskeletal: No chest wall mass or suspicious bone lesions identified. CT ABDOMEN PELVIS FINDINGS Hepatobiliary: No focal liver abnormality is seen. No gallstones, gallbladder wall thickening, or biliary dilatation. Pancreas: Unremarkable. No pancreatic ductal dilatation or surrounding inflammatory changes. Spleen: Normal in size without focal abnormality. Adrenals/Urinary Tract: No adrenal mass. 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 Urinary bladder incompletely distended. Stomach/Bowel: Stomach is decompressed, fundus and  cardia involved in moderate hiatal hernia. Small bowel is nondistended. Normal appendix. Colon is partially distended by gas and fecal material, without acute finding. Vascular/Lymphatic: No significant vascular findings are present. No enlarged abdominal or pelvic lymph nodes. Reproductive: Uterus and bilateral adnexa are unremarkable. Other: No ascites.  No free air. Musculoskeletal: No acute or significant osseous findings. IMPRESSION: 1. Moderate hiatal hernia, increased in size since prior study. 2. Horseshoe kidney. Electronically Signed   By: Lucrezia Europe M.D.   On: 05/23/2022 19:46   DG Chest 2 View  Result Date: 05/23/2022 CLINICAL DATA:  Hiatal hernia repair December 13, 2021. Chest pain. Burping. Shortness of breath. EXAM: CHEST - 2 VIEW COMPARISON:  Upper GI December 13, 2021.  Upper GI October 04, 2021. FINDINGS: A rounded collection of air is identified posterior to the left side of the heart with an air-fluid level on the lateral view. Probable mild adjacent atelectasis. The heart, hila, mediastinum, lungs, and pleura are otherwise unremarkable. IMPRESSION: A rounded collection of air is identified posterior to the left side of the heart with an air-fluid level on the lateral view is nonspecific. However, a recurrent hiatal hernia is favored. Recommend a CT scan of the chest for further evaluation. Electronically Signed   By: Dorise Bullion III M.D.   On: 05/23/2022 16:23    Review of Systems  Respiratory:  Positive for cough.   Gastrointestinal:  Positive for nausea.  All other systems reviewed and are negative.  Blood pressure 99/60, pulse 83, temperature 97.9 F (36.6 C), temperature source Oral, resp. rate 17, height '4\' 11"'$  (1.499 m), weight 64.4 kg, last menstrual period 05/19/2022, SpO2 100 %. Physical Exam Constitutional:      Appearance: She is well-developed. She is ill-appearing.  HENT:     Head: Normocephalic and atraumatic.  Cardiovascular:     Rate and Rhythm: Normal rate and  regular rhythm.  Pulmonary:     Effort: Pulmonary effort is normal.  Abdominal:     General: There is no distension.     Palpations: Abdomen is soft.     Tenderness: There is no abdominal tenderness.  Neurological:     General: No focal deficit present.     Mental Status: She is alert.  Psychiatric:        Mood and Affect: Mood is anxious.     Assessment/Plan: Hiatal hernia, nausea -does not appear to have anything requiring urgent surgery such as volvulus, exam not worrisome. -does appear to have recurrent HH.  I think best next step is to get an UGI  in am.  Leave npo, antiemetics overnight -will notify her surgeon Dr Redmond Pulling  I spent 35 minutes reviewing data, with patient, discussing with Dr Vanita Panda and reviewing ct scan that shows [facility] and reviewing her old op report  Rolm Bookbinder 05/23/2022,  10:24 PM

## 2022-05-23 NOTE — ED Notes (Signed)
Patient transported to X-ray 

## 2022-05-24 ENCOUNTER — Observation Stay (HOSPITAL_COMMUNITY): Payer: 59

## 2022-05-24 DIAGNOSIS — Z9889 Other specified postprocedural states: Secondary | ICD-10-CM | POA: Diagnosis not present

## 2022-05-24 DIAGNOSIS — K449 Diaphragmatic hernia without obstruction or gangrene: Secondary | ICD-10-CM

## 2022-05-24 DIAGNOSIS — R112 Nausea with vomiting, unspecified: Secondary | ICD-10-CM

## 2022-05-24 LAB — COMPREHENSIVE METABOLIC PANEL
ALT: 26 U/L (ref 0–44)
AST: 17 U/L (ref 15–41)
Albumin: 3.4 g/dL — ABNORMAL LOW (ref 3.5–5.0)
Alkaline Phosphatase: 48 U/L (ref 38–126)
Anion gap: 10 (ref 5–15)
BUN: 6 mg/dL (ref 6–20)
CO2: 19 mmol/L — ABNORMAL LOW (ref 22–32)
Calcium: 8.8 mg/dL — ABNORMAL LOW (ref 8.9–10.3)
Chloride: 108 mmol/L (ref 98–111)
Creatinine, Ser: 0.76 mg/dL (ref 0.44–1.00)
GFR, Estimated: >60 mL/min (ref >60)
Glucose, Bld: 123 mg/dL — ABNORMAL HIGH (ref 70–99)
Potassium: 4.1 mmol/L (ref 3.5–5.1)
Sodium: 137 mmol/L (ref 135–145)
Total Bilirubin: 0.3 mg/dL (ref 0.3–1.2)
Total Protein: 6.4 g/dL — ABNORMAL LOW (ref 6.5–8.1)

## 2022-05-24 LAB — CBC
HCT: 33.8 % — ABNORMAL LOW (ref 36.0–46.0)
Hemoglobin: 11.7 g/dL — ABNORMAL LOW (ref 12.0–15.0)
MCH: 31.8 pg (ref 26.0–34.0)
MCHC: 34.6 g/dL (ref 30.0–36.0)
MCV: 91.8 fL (ref 80.0–100.0)
Platelets: 343 10*3/uL (ref 150–400)
RBC: 3.68 MIL/uL — ABNORMAL LOW (ref 3.87–5.11)
RDW: 13.2 % (ref 11.5–15.5)
WBC: 6.7 10*3/uL (ref 4.0–10.5)
nRBC: 0 % (ref 0.0–0.2)

## 2022-05-24 LAB — HIV ANTIBODY (ROUTINE TESTING W REFLEX): HIV Screen 4th Generation wRfx: NONREACTIVE

## 2022-05-24 MED ORDER — PANTOPRAZOLE SODIUM 40 MG IV SOLR: 40.0000 mg | INTRAVENOUS | Status: DC

## 2022-05-24 MED ORDER — PROMETHAZINE HCL 12.5 MG RE SUPP: 12.5000 mg | Freq: Four times a day (QID) | RECTAL | Status: DC | PRN

## 2022-05-24 MED ORDER — PANTOPRAZOLE SODIUM 40 MG IV SOLR
40.0000 mg | Freq: Two times a day (BID) | INTRAVENOUS | Status: DC
  Administered 2022-05-24 – 2022-05-25 (×3): 40 mg via INTRAVENOUS
  Filled 2022-05-24 (×3): qty 10

## 2022-05-24 MED ORDER — PROMETHAZINE HCL 25 MG PO TABS: 25.0000 mg | ORAL_TABLET | Freq: Four times a day (QID) | ORAL | Status: DC | PRN

## 2022-05-24 MED ORDER — SUCRALFATE 1 GM/10ML PO SUSP
1.0000 g | Freq: Three times a day (TID) | ORAL | Status: DC
  Administered 2022-05-24 – 2022-05-25 (×4): 1 g via ORAL
  Filled 2022-05-24 (×4): qty 10

## 2022-05-24 MED ORDER — SODIUM CHLORIDE 0.9 % IV SOLN
12.5000 mg | Freq: Four times a day (QID) | INTRAVENOUS | Status: DC | PRN
  Administered 2022-05-24 (×2): 12.5 mg via INTRAVENOUS
  Filled 2022-05-24 (×3): qty 0.5

## 2022-05-24 MED ORDER — DEXAMETHASONE SODIUM PHOSPHATE 4 MG/ML IJ SOLN
4.0000 mg | Freq: Four times a day (QID) | INTRAMUSCULAR | Status: DC
  Administered 2022-05-24 – 2022-05-25 (×4): 4 mg via INTRAVENOUS
  Filled 2022-05-24 (×6): qty 1

## 2022-05-24 NOTE — Care Management (Signed)
  Transition of Care [facility]) Screening Note   Patient Details  Name: Joan Simpson Date of Birth: [birth year]   Transition of [facility]) CM/SW Contact:    Carles Collet, RN Phone Number: 05/24/2022, 8:24 AM    Transition of Care Department [facility]) has reviewed patient and no TOC needs have been identified at this time. We will continue to monitor patient advancement through interdisciplinary progression rounds. If new patient transition needs arise, please place a TOC consult.   From home w mother, independent, N/V/Abd pain.  Has PCP  Coverage through [facility] commercial policy.

## 2022-05-24 NOTE — H&P (Signed)
History and Physical    Patient: Joan Simpson XTA:[id number] DOB: 23-Jul-1990 DOA: 05/23/2022 DOS: the patient was seen and examined on 05/24/2022 PCP: Janith Lima, MD  Patient coming from: Home  Chief Complaint:  Chief Complaint  Patient presents with   Nausea   Abdominal Pain   HPI: Joan Simpson is a 32 y.o. female with medical history significant of prior hiatal hernia, s/p Nissen Fundoplication in Aug 6553.  Pt with onset of nausea on Sunday.  Then today this progressed to onset of abd pain, dry heaving, unable to vomit.  Some CP as well.    Review of Systems: As mentioned in the history of present illness. All other systems reviewed and are negative. Past Medical History:  Diagnosis Date   Allergy    Anemia    Anxiety    Bipolar 1 disorder ([place])    Depression    Hypertension    Nexplanon in place 12/09/2021   left upper arm, use caution when using left upper arm, no B/P cuff to that area please   Pre-diabetes    Past Surgical History:  Procedure Laterality Date   NO PAST SURGERIES     UPPER GI ENDOSCOPY N/A 12/12/2021   Procedure: UPPER GI ENDOSCOPY;  Surgeon: Greer Pickerel, MD;  Location: [facility];  Service: General;  Laterality: N/A;   XI ROBOTIC ASSISTED HIATAL HERNIA REPAIR N/A 12/12/2021   Procedure: XI ROBOTIC ASSISTED HIATAL HERNIA  REPAIR WITH FUNDOPLICATION;  Surgeon: Greer Pickerel, MD;  Location: [facility];  Service: General;  Laterality: N/A;   Social History:  reports that she has never smoked. She has never been exposed to tobacco smoke. She has never used smokeless tobacco. She reports that she does not currently use alcohol. She reports current drug use.  Allergies  Allergen Reactions   Erythromycin Anaphylaxis and Swelling    Says she throws up and can't breath     Family History  Problem Relation Age of Onset   Diabetes Mother    Hypertension Mother    Heart disease Mother    Other Father        prediabetes   Hypertension Father     Diabetes Brother    Heart disease Brother    Diabetes Paternal Grandmother    Colon cancer Neg Hx    Esophageal cancer Neg Hx    Inflammatory bowel disease Neg Hx    Liver disease Neg Hx    Pancreatic cancer Neg Hx    Rectal cancer Neg Hx    Stomach cancer Neg Hx     Prior to Admission medications   Medication Sig Start Date End Date Taking? Authorizing Provider  aspirin-acetaminophen-caffeine (EXCEDRIN MIGRAINE) 509 072 [id number] MG tablet Take 2 tablets by mouth daily as needed for headache.   Yes [provider]  buPROPion (WELLBUTRIN XL) 300 MG 24 hr tablet Take 300 mg by mouth daily. 06/17/21  Yes [provider]  Cyanocobalamin (VITAMIN B-12 PO) Take 1 tablet by mouth daily.   Yes [provider]  etonogestrel (NEXPLANON) 68 MG IMPL implant 1 each by Subdermal route once.   Yes [provider]  lamoTRIgine (LAMICTAL) 200 MG tablet Take 200 mg by mouth daily. 01/13/20  Yes [provider]  ondansetron (ZOFRAN-ODT) 8 MG disintegrating tablet Dissolve 1 tablet by mouth every 8 hours as needed for nausea or vomiting. 12/13/21  Yes Greer Pickerel, MD  nitrofurantoin, macrocrystal-monohydrate, (MACROBID) 100 MG capsule Take 100 mg by mouth 2 (two) times daily. 5 day  course. Patient not taking: Reported on 05/23/2022    [provider]  oxyCODONE (OXY IR/ROXICODONE) 5 MG immediate release tablet Take 1 tablet by mouth every 6 hours as needed for severe pain. Patient not taking: Reported on 05/23/2022 12/13/21   Greer Pickerel, MD  RABEprazole (ACIPHEX) 20 MG tablet Take 1 tablet by mouth 2 times daily. Patient not taking: Reported on 05/23/2022 12/13/21 07/22/22  Greer Pickerel, MD  Vitamin D, Ergocalciferol, (DRISDOL) 1.25 MG (50000 UNIT) CAPS capsule take 1 capsule by mouth every 7 days Patient not taking: Reported on 05/23/2022 11/04/21   Janith Lima, MD  albuterol (PROVENTIL HFA;VENTOLIN HFA) 108 (90 Base) MCG/ACT inhaler Inhale 1-2 puffs into the lungs  every 6 (six) hours as needed for wheezing or shortness of breath. Patient not taking: Reported on 07/10/2018 01/01/17 11/01/18  Marney Setting, NP  fluticasone [facility]) 50 MCG/ACT nasal spray Place 2 sprays into both nostrils daily. Patient not taking: Reported on 07/10/2018 01/16/17 11/01/18  Joretta Bachelor, [place]    Physical Exam: Vitals:   05/23/22 2125 05/23/22 2200 05/23/22 2203 05/24/22 0053  BP: 99/60 105/68  95/67  Pulse: 83 94  87  Resp: '17 14  14  '$ Temp:   97.9 F (36.6 C) 97.8 F (36.6 C)  TempSrc:   Oral Oral  SpO2: 100% 98%  100%  Weight:    66.6 kg  Height:    '5\' 2"'$  (1.575 m)   Constitutional: Ill appearing Respiratory: clear to auscultation bilaterally, no wheezing, no crackles. Normal respiratory effort. No accessory muscle use.  Cardiovascular: Regular rate and rhythm, no murmurs / rubs / gallops. No extremity edema. 2+ pedal pulses. No carotid bruits.  Abdomen: no tenderness, no masses palpated. No hepatosplenomegaly. Bowel sounds positive.  Neurologic: Grossly non-focal Psychiatric: Normal judgment and insight. Alert and oriented x 3. Normal mood.   Data Reviewed:       Latest Ref Rng & Units 05/23/2022    3:43 PM 12/13/2021    5:05 AM 12/01/2021    3:18 PM  CMP  Glucose 70 - 99 mg/dL 99  119  79   BUN 6 - 20 mg/dL 7  <5  9   Creatinine 0.44 - 1.00 mg/dL 0.93  0.66  0.81   Sodium 135 - 145 mmol/L 140  141  138   Potassium 3.5 - 5.1 mmol/L 2.9  4.1  3.6   Chloride 98 - 111 mmol/L 107  115  110   CO2 22 - 32 mmol/L '20  20  21   '$ Calcium 8.9 - 10.3 mg/dL 8.9  8.5  8.9   Total Protein 6.5 - 8.1 g/dL 6.7   7.6   Total Bilirubin 0.3 - 1.2 mg/dL 0.3   0.4   Alkaline Phos 38 - 126 U/L 52   60   AST 15 - 41 U/L 23   19   ALT 0 - 44 U/L 29   25       Latest Ref Rng & Units 05/23/2022    3:43 PM 12/13/2021    5:05 AM 12/01/2021    3:18 PM  CBC  WBC 4.0 - 10.5 K/uL 7.9  12.2  11.5   Hemoglobin 12.0 - 15.0 g/dL 11.5  10.7  12.5   Hematocrit 36.0 - 46.0 % 35.1   33.4  35.5   Platelets 150 - 400 K/uL 361  356  357      CT AP: IMPRESSION: 1. Moderate hiatal hernia, increased in size  since prior study. 2. Horseshoe kidney.  Assessment and Plan: * Intractable nausea and vomiting CT AP most impressive for recurrent hiatal hernia now larger than prior to the Nissen in Aug.  (Dont think radiologist knew that patient had a Nissen fundoplication in between the CT scans in 2022 and now). No mention of volvulus, gastric outlet obstruction or distended stomach despite inability to vomit, etc. Per gen surg recs: NPO UGI series in AM Zofran scheduled Compazine PRN refractory nausea Dilaudid prn pain  Hiatal hernia Recurrent hiatal hernia despite nissen in August.      Advance Care Planning:   Code Status: Full Code  Consults: General surgery  Family Communication: Mother at bedside  Severity of Illness: The appropriate patient status for this patient is OBSERVATION. Observation status is judged to be reasonable and necessary in order to provide the required intensity of service to ensure the patient's safety. The patient's presenting symptoms, physical exam findings, and initial radiographic and laboratory data in the context of their medical condition is felt to place them at decreased risk for further clinical deterioration. Furthermore, it is anticipated that the patient will be medically stable for discharge from the hospital within 2 midnights of admission.   Author: Etta Quill., DO 05/24/2022 1:09 AM  For on call review [email or web address].

## 2022-05-24 NOTE — Progress Notes (Signed)
Subjective/Chief Complaint: Mom at bs Pt reports stomach virus with retching a few weeks ago followed by covid with lots of coughing and all the typical covid symptoms.  Symptoms started Sunday with constant nausea, lower chest pain, epigastric pain.  Able to tolerate liquids  Has had ongoing nausea here.    Objective: Vital signs in last 24 hours: Temp:  [97.7 F (36.5 C)-98.9 F (37.2 C)] 98.4 F (36.9 C) (02/07 1208) Pulse Rate:  [65-107] 65 (02/07 1208) Resp:  [12-20] 17 (02/07 1208) BP: (94-120)/(51-72) 97/58 (02/07 1208) SpO2:  [95 %-100 %] 96 % (02/07 1208) Weight:  [64.4 kg-66.6 kg] 66.6 kg (02/07 0053) Last BM Date : 05/23/22  Intake/Output from previous day: 02/06 0701 - 02/07 0700 In: 1758.8 [I.V.:658.8; IV Piggyback:1100] Out: -  Intake/Output this shift: No intake/output data recorded.  Alert, alittle sleepy Nontoxic Soft, nt, nd  Lab Results:  Recent Labs    05/23/22 1543 05/24/22 0405  WBC 7.9 6.7  HGB 11.5* 11.7*  HCT 35.1* 33.8*  PLT 361 343   BMET Recent Labs    05/23/22 1543 05/24/22 0405  NA 140 137  K 2.9* 4.1  CL 107 108  CO2 20* 19*  GLUCOSE 99 123*  BUN 7 6  CREATININE 0.93 0.76  CALCIUM 8.9 8.8*   PT/INR No results for input(s): "LABPROT", "INR" in the last 72 hours. ABG No results for input(s): "PHART", "HCO3" in the last 72 hours.  Invalid input(s): "PCO2", "PO2"  Studies/Results: CT CHEST ABDOMEN PELVIS W CONTRAST  Result Date: 05/23/2022 CLINICAL DATA:  Possible hiatal hernia on radiography, chest pain. EXAM: CT CHEST, ABDOMEN, AND PELVIS WITH CONTRAST TECHNIQUE: Multidetector CT imaging of the chest, abdomen and pelvis was performed following the standard protocol during bolus administration of intravenous contrast. RADIATION DOSE REDUCTION: This exam was performed according to the departmental dose-optimization program which includes automated exposure control, adjustment of the mA and/or kV according to patient  size and/or use of iterative reconstruction technique. CONTRAST:  45m OMNIPAQUE IOHEXOL 350 MG/ML SOLN COMPARISON:  02/03/2021 FINDINGS: CT CHEST FINDINGS Cardiovascular: Heart size normal. No pericardial effusion. Great vessels unremarkable. Mediastinum/Nodes: There is a moderate hiatal hernia, increased in size since prior study, involving the gastric fundus and cardia. No adenopathy. Lungs/Pleura: No pleural effusion. No pneumothorax. Subsegmental atelectasis medially in the left lower lobe adjacent to the hiatal hernia. Lungs otherwise clear. Musculoskeletal: No chest wall mass or suspicious bone lesions identified. CT ABDOMEN PELVIS FINDINGS Hepatobiliary: No focal liver abnormality is seen. No gallstones, gallbladder wall thickening, or biliary dilatation. Pancreas: Unremarkable. No pancreatic ductal dilatation or surrounding inflammatory changes. Spleen: Normal in size without focal abnormality. Adrenals/Urinary Tract: No adrenal mass. 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 Urinary bladder incompletely distended. Stomach/Bowel: Stomach is decompressed, fundus and cardia involved in moderate hiatal hernia. Small bowel is nondistended. Normal appendix. Colon is partially distended by gas and fecal material, without acute finding. Vascular/Lymphatic: No significant vascular findings are present. No enlarged abdominal or pelvic lymph nodes. Reproductive: Uterus and bilateral adnexa are unremarkable. Other: No ascites.  No free air. Musculoskeletal: No acute or significant osseous findings. IMPRESSION: 1. Moderate hiatal hernia, increased in size since prior study. 2. Horseshoe kidney. Electronically Signed   By: DLucrezia EuropeM.D.   On: 05/23/2022 19:46   DG Chest 2 View  Result Date: 05/23/2022 CLINICAL DATA:  Hiatal hernia repair December 13, 2021. Chest pain. Burping. Shortness of breath. EXAM: CHEST - 2 VIEW COMPARISON:  Upper GI December 13, 2021.  Upper GI October 04, 2021. FINDINGS: A rounded  collection of air is identified posterior to the left side of the heart with an air-fluid level on the lateral view. Probable mild adjacent atelectasis. The heart, hila, mediastinum, lungs, and pleura are otherwise unremarkable. IMPRESSION: A rounded collection of air is identified posterior to the left side of the heart with an air-fluid level on the lateral view is nonspecific. However, a recurrent hiatal hernia is favored. Recommend a CT scan of the chest for further evaluation. Electronically Signed   By: Dorise Bullion III M.D.   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  Prior to that she was doing well without symptoms, occasional belching  Need to get Upper GI  Continue multi-modal anti-emetic treatment Add short course steroids Add carafate scheduled Increase PPI to bid Can have sips.  If no obstruction on upper gi, can have clears  Long conversation with mom about her current workup and evaluation.  Explained that the diaphragmatic defect AKA the hiatal hernia has recurred probably due to from all the coughing.  Suspicion is that also part of the wrap or all of the wrap as herniated into the mediastinum.  Advised that we rarely go urgently back in to repair this.  Only if she has a complete obstruction with this change the timeline for reoperative intervention.  Discussed that reoperative intervention has increased risk for complications such as injury to surrounding structures like the vagus nerve, esophagus, stomach.  Would likely need mesh at this point which  has inherent potential issues such as increased risk of postoperative dysphagia, potential increased risk of fistula.  Discussed that quality of life outcomes & patients satisfaction scores go down with each subsequent hiatal hernia repair.  Also if we are not able to achieve significant intra-abdominal esophageal length.  This may require Collis gastroplasty.  So therefore for numerous reasons this is not something we go rushing back into the operating room for  Explained that our goal is to try to find a "cocktail "regimen to get her on to help control her nausea is so that she can tolerate liquids and/or full liquids  Ultimately will need an upper endoscopy.  If cannot get patient to an upper GI will need upper endoscopy during this admission  Data reviewed -I reviewed my operative note from August 2023, consult note from February 6, ER notes for the past 23 hours, Dr. Juleen China H&P, labs for the past 36 hours along with vital signs.  Personally reviewed her CT scan  Leighton Ruff. Redmond Pulling, MD, [place] Bariatric[facility] Minimally Invasive Surgery [facility],  [facility]    LOS: 0 days    Greer Pickerel 05/24/2022

## 2022-05-24 NOTE — Progress Notes (Signed)
[facility] PROGRESS NOTE    Progress Note  Joan Simpson  NWG:[id number] DOB: Dec 27, 1990 DOA: 05/23/2022 PCP: Janith Lima, MD     Brief Narrative:   Joan Simpson is an 32 y.o. female no significant past medical history history of prior hiatal hernia status post Nissen fundoplication in August 5784 comes in for nausea and vomiting that started 3 days prior to admission now with abdominal pain and dry heaving.  CT abdomen and pelvis showed moderate hiatal hernia with increase in size compared to prior study, horseshoe kidney, chest x-ray showed no perforation with a round collection of air identified in the posterior left side.  General surgery has been consulted    Assessment/Plan:   Intractable nausea and vomiting abdominal pain: With a recent Nissen fundoplication in August 6962 General surgery was consulted recommended an upper GI series in the morning. Keep n.p.o. with nausea. Narcotics for analgesia. Still with significant dry heaving we will go ahead and give her Phenergan   DVT prophylaxis: lovenox Family Communication: Mother Status is: Observation The patient remains OBS appropriate and will d/c before 2 midnights.    Code Status:     Code Status Orders  (From admission, onward)           Start     Ordered   05/23/22 2322  Full code  Continuous       Question:  By:  Answer:  Consent: discussion documented in EHR   05/23/22 2323           Code Status History     Date Active Date Inactive Code Status Order ID Comments User Context   12/12/2021 1508 12/13/2021 2003 Full Code [id number]  Greer Pickerel, MD Inpatient         IV Access:   Peripheral IV   Procedures and diagnostic studies:   CT CHEST ABDOMEN PELVIS W CONTRAST  Result Date: 05/23/2022 CLINICAL DATA:  Possible hiatal hernia on radiography, chest pain. EXAM: CT CHEST, ABDOMEN, AND PELVIS WITH CONTRAST TECHNIQUE: Multidetector CT imaging of the chest, abdomen and pelvis  was performed following the standard protocol during bolus administration of intravenous contrast. RADIATION DOSE REDUCTION: This exam was performed according to the departmental dose-optimization program which includes automated exposure control, adjustment of the mA and/or kV according to patient size and/or use of iterative reconstruction technique. CONTRAST:  23m OMNIPAQUE IOHEXOL 350 MG/ML SOLN COMPARISON:  02/03/2021 FINDINGS: CT CHEST FINDINGS Cardiovascular: Heart size normal. No pericardial effusion. Great vessels unremarkable. Mediastinum/Nodes: There is a moderate hiatal hernia, increased in size since prior study, involving the gastric fundus and cardia. No adenopathy. Lungs/Pleura: No pleural effusion. No pneumothorax. Subsegmental atelectasis medially in the left lower lobe adjacent to the hiatal hernia. Lungs otherwise clear. Musculoskeletal: No chest wall mass or suspicious bone lesions identified. CT ABDOMEN PELVIS FINDINGS Hepatobiliary: No focal liver abnormality is seen. No gallstones, gallbladder wall thickening, or biliary dilatation. Pancreas: Unremarkable. No pancreatic ductal dilatation or surrounding inflammatory changes. Spleen: Normal in size without focal abnormality. Adrenals/Urinary Tract: No adrenal mass. 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 Urinary bladder incompletely distended. Stomach/Bowel: Stomach is decompressed, fundus and cardia involved in moderate hiatal hernia. Small bowel is nondistended. Normal appendix. Colon is partially distended by gas and fecal material, without acute finding. Vascular/Lymphatic: No significant vascular findings are present. No enlarged abdominal or pelvic lymph nodes. Reproductive: Uterus and bilateral adnexa are unremarkable. Other: No ascites.  No free air. Musculoskeletal: No acute or significant osseous findings. IMPRESSION: 1. Moderate  hiatal hernia, increased in size since prior study. 2. Horseshoe kidney. Electronically  Signed   By: Lucrezia Europe M.D.   On: 05/23/2022 19:46   DG Chest 2 View  Result Date: 05/23/2022 CLINICAL DATA:  Hiatal hernia repair December 13, 2021. Chest pain. Burping. Shortness of breath. EXAM: CHEST - 2 VIEW COMPARISON:  Upper GI December 13, 2021.  Upper GI October 04, 2021. FINDINGS: A rounded collection of air is identified posterior to the left side of the heart with an air-fluid level on the lateral view. Probable mild adjacent atelectasis. The heart, hila, mediastinum, lungs, and pleura are otherwise unremarkable. IMPRESSION: A rounded collection of air is identified posterior to the left side of the heart with an air-fluid level on the lateral view is nonspecific. However, a recurrent hiatal hernia is favored. Recommend a CT scan of the chest for further evaluation. Electronically Signed   By: Dorise Bullion III M.D.   On: 05/23/2022 16:23     Medical Consultants:   None.   Subjective:    Joan Simpson she continues to dry heave this morning.  Objective:    Vitals:   05/23/22 2200 05/23/22 2203 05/24/22 0053 05/24/22 0417  BP: 105/68  95/67 95/71  Pulse: 94  87 81  Resp: '14  14 18  '$ Temp:  97.9 F (36.6 C) 97.8 F (36.6 C) 97.7 F (36.5 C)  TempSrc:  Oral Oral Oral  SpO2: 98%  100% 96%  Weight:   66.6 kg   Height:   '5\' 2"'$  (1.575 m)    SpO2: 96 %   Intake/Output Summary (Last 24 hours) at 05/24/2022 0739 Last data filed at 05/24/2022 0814 Gross per 24 hour  Intake 1758.78 ml  Output --  Net 1758.78 ml   Filed Weights   05/23/22 1521 05/24/22 0053  Weight: 64.4 kg 66.6 kg    Exam: General exam: In no acute distress. Respiratory system: Good air movement and clear to auscultation. Cardiovascular system: S1 & S2 heard, RRR. No JVD. Gastrointestinal system: Abdomen is nondistended, soft and nontender.  Extremities: No pedal edema. Skin: No rashes, lesions or ulcers Psychiatry: Judgement and insight appear normal. Mood & affect appropriate.    Data Reviewed:     Labs: Basic Metabolic Panel: Recent Labs  Lab 05/23/22 1543 05/24/22 0405  NA 140 137  K 2.9* 4.1  CL 107 108  CO2 20* 19*  GLUCOSE 99 123*  BUN 7 6  CREATININE 0.93 0.76  CALCIUM 8.9 8.8*   GFR Estimated Creatinine Clearance: 92 mL/min (by C-G formula based on SCr of 0.76 mg/dL). Liver Function Tests: Recent Labs  Lab 05/23/22 1543 05/24/22 0405  AST 23 17  ALT 29 26  ALKPHOS 52 48  BILITOT 0.3 0.3  PROT 6.7 6.4*  ALBUMIN 3.7 3.4*   Recent Labs  Lab 05/23/22 1543  LIPASE 32   No results for input(s): "AMMONIA" in the last 168 hours. Coagulation profile No results for input(s): "INR", "PROTIME" in the last 168 hours. COVID-19 Labs  No results for input(s): "DDIMER", "FERRITIN", "LDH", "CRP" in the last 72 hours.  Lab Results  Component Value Date   SARSCOV2NAA NEGATIVE 02/09/2020   SARSCOV2NAA NEGATIVE 09/22/2019   SARSCOV2NAA NEGATIVE 05/20/2019   [place] NEGATIVE 11/01/2018    CBC: Recent Labs  Lab 05/23/22 1543 05/24/22 0405  WBC 7.9 6.7  NEUTROABS 5.4  --   HGB 11.5* 11.7*  HCT 35.1* 33.8*  MCV 94.4 91.8  PLT 361 343   Cardiac  Enzymes: No results for input(s): "CKTOTAL", "CKMB", "CKMBINDEX", "TROPONINI" in the last 168 hours. BNP (last 3 results) No results for input(s): "PROBNP" in the last 8760 hours. CBG: No results for input(s): "GLUCAP" in the last 168 hours. D-Dimer: No results for input(s): "DDIMER" in the last 72 hours. Hgb A1c: No results for input(s): "HGBA1C" in the last 72 hours. Lipid Profile: No results for input(s): "CHOL", "HDL", "LDLCALC", "TRIG", "CHOLHDL", "LDLDIRECT" in the last 72 hours. Thyroid function studies: No results for input(s): "TSH", "T4TOTAL", "T3FREE", "THYROIDAB" in the last 72 hours.  Invalid input(s): "FREET3" Anemia work up: No results for input(s): "VITAMINB12", "FOLATE", "FERRITIN", "TIBC", "IRON", "RETICCTPCT" in the last 72 hours. Sepsis Labs: Recent Labs  Lab 05/23/22 1543  05/24/22 0405  WBC 7.9 6.7   Microbiology No results found for this or any previous visit (from the past 240 hour(s)).   Medications:    enoxaparin (LOVENOX) injection  40 mg Subcutaneous Q24H   ondansetron (ZOFRAN) IV  4 mg Intravenous Q6H   Continuous Infusions:  lactated ringers 100 mL/hr at 05/23/22 2347      LOS: 0 days   Charlynne Cousins  [facility]  05/24/2022, 7:39 AM

## 2022-05-25 DIAGNOSIS — R142 Eructation: Secondary | ICD-10-CM

## 2022-05-25 DIAGNOSIS — R110 Nausea: Secondary | ICD-10-CM | POA: Diagnosis not present

## 2022-05-25 DIAGNOSIS — R112 Nausea with vomiting, unspecified: Secondary | ICD-10-CM | POA: Diagnosis not present

## 2022-05-25 MED ORDER — PROMETHAZINE HCL 12.5 MG PO TABS: 12.5000 mg | ORAL_TABLET | Freq: Three times a day (TID) | ORAL | 0 refills | Status: DC | PRN

## 2022-05-25 MED ORDER — SODIUM CHLORIDE 0.9 % IV BOLUS
500.0000 mL | Freq: Once | INTRAVENOUS | Status: AC
  Administered 2022-05-25: 500 mL via INTRAVENOUS

## 2022-05-25 MED ORDER — ONDANSETRON 8 MG PO TBDP: 8.0000 mg | ORAL_TABLET | Freq: Three times a day (TID) | ORAL | 1 refills | Status: DC | PRN

## 2022-05-25 MED ORDER — OXYCODONE HCL 5 MG PO TABS: 5.0000 mg | ORAL_TABLET | Freq: Four times a day (QID) | ORAL | 0 refills | Status: DC | PRN

## 2022-05-25 MED ORDER — PANTOPRAZOLE SODIUM 40 MG PO TBEC: 40.0000 mg | DELAYED_RELEASE_TABLET | Freq: Two times a day (BID) | ORAL | 1 refills | Status: DC

## 2022-05-25 MED ORDER — SUCRALFATE 1 GM/10ML PO SUSP: 1.0000 g | Freq: Three times a day (TID) | ORAL | 2 refills | Status: DC

## 2022-05-25 NOTE — Progress Notes (Signed)
New orders received.

## 2022-05-25 NOTE — Progress Notes (Signed)
[facility] PROGRESS NOTE    Progress Note  Joan Simpson  AUQ:[id number] DOB: 1990-04-22 DOA: 05/23/2022 PCP: Janith Lima, MD     Brief Narrative:   Joan Simpson is an 32 y.o. female no significant past medical history history of prior hiatal hernia status post Nissen fundoplication in August 6389 comes in for nausea and vomiting that started 3 days prior to admission now with abdominal pain and dry heaving.  CT abdomen and pelvis showed moderate hiatal hernia with increase in size compared to prior study, horseshoe kidney, chest x-ray showed no perforation with a round collection of air identified in the posterior left side.  General surgery has been consulted    Assessment/Plan:   Intractable nausea and vomiting abdominal pain: With a recent Nissen fundoplication in August 3734 General surgery was consulted recommended an upper GI series which showed no acute abnormalities. Allow full liquid diet. Narcotics for analgesia. She relates that the Phenergan did not really help her.   DVT prophylaxis: lovenox Family Communication: Mother Status is: Observation The patient remains OBS appropriate and will d/c before 2 midnights.    Code Status:     Code Status Orders  (From admission, onward)           Start     Ordered   05/23/22 2322  Full code  Continuous       Question:  By:  Answer:  Consent: discussion documented in EHR   05/23/22 2323           Code Status History     Date Active Date Inactive Code Status Order ID Comments User Context   12/12/2021 1508 12/13/2021 2003 Full Code [id number]  Greer Pickerel, MD Inpatient         IV Access:   Peripheral IV   Procedures and diagnostic studies:   DG UGI W SINGLE CM (SOL OR THIN BA)  Result Date: 05/24/2022 CLINICAL DATA:  Patient with history hiatal hernia s/p Nissen fundoplication 2/62/03, recent covid infection with persistent coughing admitted yesterday with intractable nausea and  vomiting as well as chest pain. CT chest/abd/pelvis w/contrast showed recurrent hiatal hernia. Request for UGI to further assess. EXAM: DG UGI W SINGLE CM TECHNIQUE: Scout radiograph was obtained. Single contrast examination was performed using thin liquid barium. This exam was performed by Candiss Norse, PA-C, and was supervised and interpreted by Richardean Sale, MD. FLUOROSCOPY: Radiation Exposure Index (as provided by the fluoroscopic device): 38.00 mGy Kerma COMPARISON:  CT chest/abd/pelvis w/contrast 05/23/22, UGI 12/13/21, UGI 10/04/21 FINDINGS: Scout Radiograph: Normal bowel gas pattern. Contrast within urinary bladder from recent CT. Esophagus: No mucosal ulceration or stricture. The gastroesophageal junction is patent and above the diaphragm. As seen on recent CT, there is a recurrent large mixed type hiatal hernia with approximately 2/3 of the stomach herniated into the lower chest. No evidence of leak or obstruction. Esophageal motility:  Within normal limits on limited exam. Gastroesophageal reflux:  None visualized on limited exam. Ingested 13 mm barium tablet:  Not given Stomach: As above, large recurrent mixed-type hiatal hernia without obstruction at the gastroesophageal junction or the diaphragmatic hiatus. No focal mucosal abnormality identified. Gastric emptying: Normal. Duodenum:  Normal appearance. Other: Exam limited due to patient weakness and difficulty tolerating oral contrast secondary to nausea. IMPRESSION: Recurrent large, mixed type hiatal hernia following Nissen fundoplication. No evidence of leak, obstruction or focal mucosal abnormality. Electronically Signed   By: Richardean Sale M.D.   On: 05/24/2022 14:59   CT CHEST  ABDOMEN PELVIS W CONTRAST  Result Date: 05/23/2022 CLINICAL DATA:  Possible hiatal hernia on radiography, chest pain. EXAM: CT CHEST, ABDOMEN, AND PELVIS WITH CONTRAST TECHNIQUE: Multidetector CT imaging of the chest, abdomen and pelvis was performed following the  standard protocol during bolus administration of intravenous contrast. RADIATION DOSE REDUCTION: This exam was performed according to the departmental dose-optimization program which includes automated exposure control, adjustment of the mA and/or kV according to patient size and/or use of iterative reconstruction technique. CONTRAST:  36m OMNIPAQUE IOHEXOL 350 MG/ML SOLN COMPARISON:  02/03/2021 FINDINGS: CT CHEST FINDINGS Cardiovascular: Heart size normal. No pericardial effusion. Great vessels unremarkable. Mediastinum/Nodes: There is a moderate hiatal hernia, increased in size since prior study, involving the gastric fundus and cardia. No adenopathy. Lungs/Pleura: No pleural effusion. No pneumothorax. Subsegmental atelectasis medially in the left lower lobe adjacent to the hiatal hernia. Lungs otherwise clear. Musculoskeletal: No chest wall mass or suspicious bone lesions identified. CT ABDOMEN PELVIS FINDINGS Hepatobiliary: No focal liver abnormality is seen. No gallstones, gallbladder wall thickening, or biliary dilatation. Pancreas: Unremarkable. No pancreatic ductal dilatation or surrounding inflammatory changes. Spleen: Normal in size without focal abnormality. Adrenals/Urinary Tract: No adrenal mass. 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 Urinary bladder incompletely distended. Stomach/Bowel: Stomach is decompressed, fundus and cardia involved in moderate hiatal hernia. Small bowel is nondistended. Normal appendix. Colon is partially distended by gas and fecal material, without acute finding. Vascular/Lymphatic: No significant vascular findings are present. No enlarged abdominal or pelvic lymph nodes. Reproductive: Uterus and bilateral adnexa are unremarkable. Other: No ascites.  No free air. Musculoskeletal: No acute or significant osseous findings. IMPRESSION: 1. Moderate hiatal hernia, increased in size since prior study. 2. Horseshoe kidney. Electronically Signed   By: DLucrezia EuropeM.D.    On: 05/23/2022 19:46   DG Chest 2 View  Result Date: 05/23/2022 CLINICAL DATA:  Hiatal hernia repair December 13, 2021. Chest pain. Burping. Shortness of breath. EXAM: CHEST - 2 VIEW COMPARISON:  Upper GI December 13, 2021.  Upper GI October 04, 2021. FINDINGS: A rounded collection of air is identified posterior to the left side of the heart with an air-fluid level on the lateral view. Probable mild adjacent atelectasis. The heart, hila, mediastinum, lungs, and pleura are otherwise unremarkable. IMPRESSION: A rounded collection of air is identified posterior to the left side of the heart with an air-fluid level on the lateral view is nonspecific. However, a recurrent hiatal hernia is favored. Recommend a CT scan of the chest for further evaluation. Electronically Signed   By: DDorise BullionIII M.D.   On: 05/23/2022 16:23     Medical Consultants:   None.   Subjective:    GFran Lowesdry heaving is improved tolerating her clear liquid diet  Objective:    Vitals:   05/25/22 0446 05/25/22 0607 05/25/22 0611 05/25/22 0804  BP: (!) 86/50 (!) 83/53 (!) 90/50 (!) 88/50  Pulse:    86  Resp:    18  Temp:    98.3 F (36.8 C)  TempSrc:    Oral  SpO2:    99%  Weight:      Height:       SpO2: 99 % O2 Flow Rate (L/min): 0 L/min   Intake/Output Summary (Last 24 hours) at 05/25/2022 0942 Last data filed at 05/24/2022 1857 Gross per 24 hour  Intake 1117.67 ml  Output --  Net 1117.67 ml    Filed Weights   05/23/22 1521 05/24/22 0053  Weight: 64.4 kg  66.6 kg    Exam: General exam: In no acute distress. Respiratory system: Good air movement and clear to auscultation. Cardiovascular system: S1 & S2 heard, RRR. No JVD. Gastrointestinal system: Abdomen is nondistended, soft and nontender.  Extremities: No pedal edema. Skin: No rashes, lesions or ulcers Psychiatry: Judgement and insight appear normal. Mood & affect appropriate.  Data Reviewed:    Labs: Basic Metabolic  Panel: Recent Labs  Lab 05/23/22 1543 05/24/22 0405  NA 140 137  K 2.9* 4.1  CL 107 108  CO2 20* 19*  GLUCOSE 99 123*  BUN 7 6  CREATININE 0.93 0.76  CALCIUM 8.9 8.8*    GFR Estimated Creatinine Clearance: 92 mL/min (by C-G formula based on SCr of 0.76 mg/dL). Liver Function Tests: Recent Labs  Lab 05/23/22 1543 05/24/22 0405  AST 23 17  ALT 29 26  ALKPHOS 52 48  BILITOT 0.3 0.3  PROT 6.7 6.4*  ALBUMIN 3.7 3.4*    Recent Labs  Lab 05/23/22 1543  LIPASE 32    No results for input(s): "AMMONIA" in the last 168 hours. Coagulation profile No results for input(s): "INR", "PROTIME" in the last 168 hours. COVID-19 Labs  No results for input(s): "DDIMER", "FERRITIN", "LDH", "CRP" in the last 72 hours.  Lab Results  Component Value Date   SARSCOV2NAA NEGATIVE 02/09/2020   SARSCOV2NAA NEGATIVE 09/22/2019   [place] NEGATIVE 05/20/2019   [place] NEGATIVE 11/01/2018    CBC: Recent Labs  Lab 05/23/22 1543 05/24/22 0405  WBC 7.9 6.7  NEUTROABS 5.4  --   HGB 11.5* 11.7*  HCT 35.1* 33.8*  MCV 94.4 91.8  PLT 361 343    Cardiac Enzymes: No results for input(s): "CKTOTAL", "CKMB", "CKMBINDEX", "TROPONINI" in the last 168 hours. BNP (last 3 results) No results for input(s): "PROBNP" in the last 8760 hours. CBG: No results for input(s): "GLUCAP" in the last 168 hours. D-Dimer: No results for input(s): "DDIMER" in the last 72 hours. Hgb A1c: No results for input(s): "HGBA1C" in the last 72 hours. Lipid Profile: No results for input(s): "CHOL", "HDL", "LDLCALC", "TRIG", "CHOLHDL", "LDLDIRECT" in the last 72 hours. Thyroid function studies: No results for input(s): "TSH", "T4TOTAL", "T3FREE", "THYROIDAB" in the last 72 hours.  Invalid input(s): "FREET3" Anemia work up: No results for input(s): "VITAMINB12", "FOLATE", "FERRITIN", "TIBC", "IRON", "RETICCTPCT" in the last 72 hours. Sepsis Labs: Recent Labs  Lab 05/23/22 1543 05/24/22 0405  WBC 7.9  6.7    Microbiology No results found for this or any previous visit (from the past 240 hour(s)).   Medications:    dexamethasone (DECADRON) injection  4 mg Intravenous Q6H   enoxaparin (LOVENOX) injection  40 mg Subcutaneous Q24H   ondansetron (ZOFRAN) IV  4 mg Intravenous Q6H   pantoprazole (PROTONIX) IV  40 mg Intravenous Q12H   sucralfate  1 g Oral TID WC & HS   Continuous Infusions:  lactated ringers 100 mL/hr at 05/24/22 2150   promethazine (PHENERGAN) injection (IM or IVPB) 12.5 mg (05/24/22 2239)      LOS: 0 days   Charlynne Cousins  [facility]  05/25/2022, 9:42 AM

## 2022-05-25 NOTE — Progress Notes (Signed)
   05/25/22 1008  Mobility  Activity Ambulated independently in hallway  Level of Assistance Independent  Assistive Device None  Distance Ambulated (ft) 300 ft  Activity Response Tolerated well  Mobility Referral Yes  $Mobility charge 1 Mobility   Mobility Specialist Progress Note  Pt was in bed and agreeable. Had no c/o pain. Returned to bed w/ all needs met and call bell in reach.   Lucious Groves Mobility Specialist  Please contact via SecureChat or Rehab office at [phone]

## 2022-05-25 NOTE — Progress Notes (Signed)
Subjective/Chief Complaint: Patient feeling much better today.  No further nausea.  Tolerating full liquid diet this am.   Objective: Vital signs in last 24 hours: Temp:  [98.3 F (36.8 C)-98.4 F (36.9 C)] 98.3 F (36.8 C) (02/08 0804) Pulse Rate:  [63-86] 86 (02/08 0804) Resp:  [16-18] 18 (02/08 0804) BP: (80-97)/(40-58) 88/50 (02/08 0804) SpO2:  [96 %-99 %] 99 % (02/08 0804) Last BM Date : 05/23/22  Intake/Output from previous day: 02/07 0701 - 02/08 0700 In: 1117.7 [I.V.:1117.7] Out: -  Intake/Output this shift: No intake/output data recorded.  Gen: NAD, Nontoxic Abd: Soft, nt, nd  Lab Results:  Recent Labs    05/23/22 1543 05/24/22 0405  WBC 7.9 6.7  HGB 11.5* 11.7*  HCT 35.1* 33.8*  PLT 361 343   BMET Recent Labs    05/23/22 1543 05/24/22 0405  NA 140 137  K 2.9* 4.1  CL 107 108  CO2 20* 19*  GLUCOSE 99 123*  BUN 7 6  CREATININE 0.93 0.76  CALCIUM 8.9 8.8*   PT/INR No results for input(s): "LABPROT", "INR" in the last 72 hours. ABG No results for input(s): "PHART", "HCO3" in the last 72 hours.  Invalid input(s): "PCO2", "PO2"  Studies/Results: DG UGI W SINGLE CM (SOL OR THIN BA)  Result Date: 05/24/2022 CLINICAL DATA:  Patient with history hiatal hernia s/p Nissen fundoplication [id number], recent covid infection with persistent coughing admitted yesterday with intractable nausea and vomiting as well as chest pain. CT chest/abd/pelvis w/contrast showed recurrent hiatal hernia. Request for UGI to further assess. EXAM: DG UGI W SINGLE CM TECHNIQUE: Scout radiograph was obtained. Single contrast examination was performed using thin liquid barium. This exam was performed by Candiss Norse, PA-C, and was supervised and interpreted by Richardean Sale, MD. FLUOROSCOPY: Radiation Exposure Index (as provided by the fluoroscopic device): 38.00 mGy Kerma COMPARISON:  CT chest/abd/pelvis w/contrast 05/23/22, UGI 12/13/21, UGI 10/04/21 FINDINGS: Scout Radiograph:  Normal bowel gas pattern. Contrast within urinary bladder from recent CT. Esophagus: No mucosal ulceration or stricture. The gastroesophageal junction is patent and above the diaphragm. As seen on recent CT, there is a recurrent large mixed type hiatal hernia with approximately 2/3 of the stomach herniated into the lower chest. No evidence of leak or obstruction. Esophageal motility:  Within normal limits on limited exam. Gastroesophageal reflux:  None visualized on limited exam. Ingested 13 mm barium tablet:  Not given Stomach: As above, large recurrent mixed-type hiatal hernia without obstruction at the gastroesophageal junction or the diaphragmatic hiatus. No focal mucosal abnormality identified. Gastric emptying: Normal. Duodenum:  Normal appearance. Other: Exam limited due to patient weakness and difficulty tolerating oral contrast secondary to nausea. IMPRESSION: Recurrent large, mixed type hiatal hernia following Nissen fundoplication. No evidence of leak, obstruction or focal mucosal abnormality. Electronically Signed   By: Richardean Sale M.D.   On: 05/24/2022 14:59   CT CHEST ABDOMEN PELVIS W CONTRAST  Result Date: 05/23/2022 CLINICAL DATA:  Possible hiatal hernia on radiography, chest pain. EXAM: CT CHEST, ABDOMEN, AND PELVIS WITH CONTRAST TECHNIQUE: Multidetector CT imaging of the chest, abdomen and pelvis was performed following the standard protocol during bolus administration of intravenous contrast. RADIATION DOSE REDUCTION: This exam was performed according to the departmental dose-optimization program which includes automated exposure control, adjustment of the mA and/or kV according to patient size and/or use of iterative reconstruction technique. CONTRAST:  49m OMNIPAQUE IOHEXOL 350 MG/ML SOLN COMPARISON:  02/03/2021 FINDINGS: CT CHEST FINDINGS Cardiovascular: Heart size normal. No pericardial effusion.  Great vessels unremarkable. Mediastinum/Nodes: There is a moderate hiatal hernia,  increased in size since prior study, involving the gastric fundus and cardia. No adenopathy. Lungs/Pleura: No pleural effusion. No pneumothorax. Subsegmental atelectasis medially in the left lower lobe adjacent to the hiatal hernia. Lungs otherwise clear. Musculoskeletal: No chest wall mass or suspicious bone lesions identified. CT ABDOMEN PELVIS FINDINGS Hepatobiliary: No focal liver abnormality is seen. No gallstones, gallbladder wall thickening, or biliary dilatation. Pancreas: Unremarkable. No pancreatic ductal dilatation or surrounding inflammatory changes. Spleen: Normal in size without focal abnormality. Adrenals/Urinary Tract: No adrenal mass. 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 Urinary bladder incompletely distended. Stomach/Bowel: Stomach is decompressed, fundus and cardia involved in moderate hiatal hernia. Small bowel is nondistended. Normal appendix. Colon is partially distended by gas and fecal material, without acute finding. Vascular/Lymphatic: No significant vascular findings are present. No enlarged abdominal or pelvic lymph nodes. Reproductive: Uterus and bilateral adnexa are unremarkable. Other: No ascites.  No free air. Musculoskeletal: No acute or significant osseous findings. IMPRESSION: 1. Moderate hiatal hernia, increased in size since prior study. 2. Horseshoe kidney. Electronically Signed   By: Lucrezia Europe M.D.   On: 05/23/2022 19:46   DG Chest 2 View  Result Date: 05/23/2022 CLINICAL DATA:  Hiatal hernia repair December 13, 2021. Chest pain. Burping. Shortness of breath. EXAM: CHEST - 2 VIEW COMPARISON:  Upper GI December 13, 2021.  Upper GI October 04, 2021. FINDINGS: A rounded collection of air is identified posterior to the left side of the heart with an air-fluid level on the lateral view. Probable mild adjacent atelectasis. The heart, hila, mediastinum, lungs, and pleura are otherwise unremarkable. IMPRESSION: A rounded collection of air is identified posterior to the  left side of the heart with an air-fluid level on the lateral view is nonspecific. However, a recurrent hiatal hernia is favored. Recommend a CT scan of the chest for further evaluation. Electronically Signed   By: Dorise Bullion III M.D.   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  Prior to that she was doing well without symptoms, occasional belching  -UGI with no evidence of obstruction, but does show large recurrent hiatal hernia  Continue multi-modal anti-emetic treatment Add short course steroids Add carafate scheduled PPI to bid -tolerating FLD, would recommend 1-2 weeks of full liquids to make sure she continues to handle this well.   -she is stable for DC home from surgical standpoint today if continues to do well. -d/w primary service.  --Per Dr. Dois Davenport note yesterday-- Long conversation with mom about her current workup and evaluation.  Explained that the diaphragmatic defect AKA the hiatal hernia has recurred probably due to from all the coughing.  Suspicion is that also part of the wrap or all of the wrap as herniated into the mediastinum.  Advised that we rarely go urgently back in to repair this.  Only if she has a complete obstruction with this change the timeline for reoperative intervention.  Discussed that reoperative intervention has increased risk for complications such as injury  to surrounding structures like the vagus nerve, esophagus, stomach.  Would likely need mesh at this point which has inherent potential issues such as increased risk of  postoperative dysphagia, potential increased risk of fistula.  Discussed that quality of life outcomes & patients satisfaction scores go down with each subsequent hiatal hernia repair.  Also if we are not able to achieve significant intra-abdominal esophageal length.  This may require Collis gastroplasty.  So therefore for numerous reasons this is not something we go rushing back into the operating room for  Explained that our goal is to try to find a "cocktail "regimen to get her on to help control her nausea is so that she can tolerate liquids and/or full liquids  Ultimately will need an upper endoscopy.  Data reviewed -I reviewed hospitalist notes, nurses notes, UGI from 2/7, I/O, vitals.  Henreitta Cea, [facility],  [facility]    LOS: 0 days    Henreitta Cea 05/25/2022

## 2022-05-25 NOTE — Discharge Instructions (Addendum)
Take protonix twice a day Take carafate before meals and at bedtime.        Joan Simpson was admitted to the Hospital on 05/23/2022 and Discharged on Discharge Date 05/25/2022 and should be excused from work/school   for 2  days starting 05/23/2022 , may return to work/school without any restrictions.  Call Bess Harvest MD, [place] [facility] [phone] with questions.  Charlynne Cousins M.D on 05/25/2022,at 3:41 PM  [facility] Office  [phone]

## 2022-05-25 NOTE — Plan of Care (Signed)

## 2022-05-25 NOTE — Discharge Summary (Signed)
Physician Discharge Summary  Joan Simpson WCH:[id number] DOB: 1991-01-20 DOA: 05/23/2022  PCP: Janith Lima, MD  Admit date: 05/23/2022 Discharge date: 05/25/2022  Admitted From: Home Disposition:  Home  Recommendations for Outpatient Follow-up:  Follow up with General SUrgery in 1-2 weeks Please obtain BMP/CBC in one week   Home Health:No Equipment/Devices:None  Discharge Condition:Stable CODE STATUS:Full Diet recommendation: Heart Healthy  Brief/Interim Summary: 32 y.o. female no significant past medical history history of prior hiatal hernia status post Nissen fundoplication in August 3536 comes in for nausea and vomiting that started 3 days prior to admission now with abdominal pain and dry heaving.  CT abdomen and pelvis showed moderate hiatal hernia with increase in size compared to prior study, horseshoe kidney, chest x-ray showed no perforation with a round collection of air identified in the posterior left side.  General surgery has been consulted    Discharge Diagnoses:  Principal Problem:   Intractable nausea and vomiting Active Problems:   Hiatal hernia   S/P Nissen fundoplication (without gastrostomy tube) procedure  Intractable nausea vomiting and abdominal pain: With recent admission Nissen fundoplication in August 1443 who comes in with nausea vomiting and abdominal pain. Was started on IV steroids, Phenergan and Compazine. General surgery was consulted recommended conservative management and she slowly improved. She was allowed full liquid diet she will continue as an outpatient. She will go home Protonix, Carafate. She has been up by his to try to avoid Excedrin for migraines*glucose little bit of gastritis and probably dry heaving.  Discharge Instructions  Discharge Instructions     Diet - low sodium heart healthy   Complete by: As directed    Increase activity slowly   Complete by: As directed       Allergies as of 05/25/2022       Reactions    Erythromycin Anaphylaxis, Swelling   Says she throws up and can't breath        Medication List     STOP taking these medications    nitrofurantoin (macrocrystal-monohydrate) 100 MG capsule Commonly known as: MACROBID   RABEprazole 20 MG tablet Commonly known as: Aciphex   Vitamin D (Ergocalciferol) 1.25 MG (50000 UNIT) Caps capsule Commonly known as: DRISDOL       TAKE these medications    aspirin-acetaminophen-caffeine 250-250-65 MG tablet Commonly known as: EXCEDRIN MIGRAINE Take 2 tablets by mouth daily as needed for headache.   buPROPion 300 MG 24 hr tablet Commonly known as: WELLBUTRIN XL Take 300 mg by mouth daily.   etonogestrel 68 MG Impl implant Commonly known as: NEXPLANON 1 each by Subdermal route once.   lamoTRIgine 200 MG tablet Commonly known as: LAMICTAL Take 200 mg by mouth daily.   ondansetron 8 MG disintegrating tablet Commonly known as: ZOFRAN-ODT Dissolve 1 tablet by mouth every 8 hours as needed for nausea or vomiting.   oxyCODONE 5 MG immediate release tablet Commonly known as: Oxy IR/ROXICODONE Take 1 tablet by mouth every 6 hours as needed for severe pain.   pantoprazole 40 MG tablet Commonly known as: Protonix Take 1 tablet (40 mg total) by mouth 2 (two) times daily.   promethazine 12.5 MG tablet Commonly known as: PHENERGAN Take 1 tablet (12.5 mg total) by mouth every 8 (eight) hours as needed for vomiting or refractory nausea / vomiting.   sucralfate 1 GM/10ML suspension Commonly known as: CARAFATE Take 10 mLs (1 g total) by mouth 4 (four) times daily -  with meals and at bedtime.   VITAMIN  B-12 PO Take 1 tablet by mouth daily.        Follow-up Information     Greer Pickerel, MD. Schedule an appointment as soon as possible for a visit in 3 week(s).   Specialty: General Surgery Contact information: [place] [address] [phone]                Allergies  Allergen Reactions    Erythromycin Anaphylaxis and Swelling    Says she throws up and can't breath     Consultations: General surgery   Procedures/Studies: DG UGI W SINGLE CM (SOL OR THIN BA)  Result Date: 05/24/2022 CLINICAL DATA:  Patient with history hiatal hernia s/p Nissen fundoplication 08/01/58, recent covid infection with persistent coughing admitted yesterday with intractable nausea and vomiting as well as chest pain. CT chest/abd/pelvis w/contrast showed recurrent hiatal hernia. Request for UGI to further assess. EXAM: DG UGI W SINGLE CM TECHNIQUE: Scout radiograph was obtained. Single contrast examination was performed using thin liquid barium. This exam was performed by Candiss Norse, PA-C, and was supervised and interpreted by Richardean Sale, MD. FLUOROSCOPY: Radiation Exposure Index (as provided by the fluoroscopic device): 38.00 mGy Kerma COMPARISON:  CT chest/abd/pelvis w/contrast 05/23/22, UGI 12/13/21, UGI 10/04/21 FINDINGS: Scout Radiograph: Normal bowel gas pattern. Contrast within urinary bladder from recent CT. Esophagus: No mucosal ulceration or stricture. The gastroesophageal junction is patent and above the diaphragm. As seen on recent CT, there is a recurrent large mixed type hiatal hernia with approximately 2/3 of the stomach herniated into the lower chest. No evidence of leak or obstruction. Esophageal motility:  Within normal limits on limited exam. Gastroesophageal reflux:  None visualized on limited exam. Ingested 13 mm barium tablet:  Not given Stomach: As above, large recurrent mixed-type hiatal hernia without obstruction at the gastroesophageal junction or the diaphragmatic hiatus. No focal mucosal abnormality identified. Gastric emptying: Normal. Duodenum:  Normal appearance. Other: Exam limited due to patient weakness and difficulty tolerating oral contrast secondary to nausea. IMPRESSION: Recurrent large, mixed type hiatal hernia following Nissen fundoplication. No evidence of leak,  obstruction or focal mucosal abnormality. Electronically Signed   By: Richardean Sale M.D.   On: 05/24/2022 14:59   CT CHEST ABDOMEN PELVIS W CONTRAST  Result Date: 05/23/2022 CLINICAL DATA:  Possible hiatal hernia on radiography, chest pain. EXAM: CT CHEST, ABDOMEN, AND PELVIS WITH CONTRAST TECHNIQUE: Multidetector CT imaging of the chest, abdomen and pelvis was performed following the standard protocol during bolus administration of intravenous contrast. RADIATION DOSE REDUCTION: This exam was performed according to the departmental dose-optimization program which includes automated exposure control, adjustment of the mA and/or kV according to patient size and/or use of iterative reconstruction technique. CONTRAST:  24m OMNIPAQUE IOHEXOL 350 MG/ML SOLN COMPARISON:  02/03/2021 FINDINGS: CT CHEST FINDINGS Cardiovascular: Heart size normal. No pericardial effusion. Great vessels unremarkable. Mediastinum/Nodes: There is a moderate hiatal hernia, increased in size since prior study, involving the gastric fundus and cardia. No adenopathy. Lungs/Pleura: No pleural effusion. No pneumothorax. Subsegmental atelectasis medially in the left lower lobe adjacent to the hiatal hernia. Lungs otherwise clear. Musculoskeletal: No chest wall mass or suspicious bone lesions identified. CT ABDOMEN PELVIS FINDINGS Hepatobiliary: No focal liver abnormality is seen. No gallstones, gallbladder wall thickening, or biliary dilatation. Pancreas: Unremarkable. No pancreatic ductal dilatation or surrounding inflammatory changes. Spleen: Normal in size without focal abnormality. Adrenals/Urinary Tract: No adrenal mass. 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 Urinary bladder incompletely distended. Stomach/Bowel: Stomach is decompressed,  fundus and cardia involved in moderate hiatal hernia. Small bowel is nondistended. Normal appendix. Colon is partially distended by gas and fecal material, without acute finding.  Vascular/Lymphatic: No significant vascular findings are present. No enlarged abdominal or pelvic lymph nodes. Reproductive: Uterus and bilateral adnexa are unremarkable. Other: No ascites.  No free air. Musculoskeletal: No acute or significant osseous findings. IMPRESSION: 1. Moderate hiatal hernia, increased in size since prior study. 2. Horseshoe kidney. Electronically Signed   By: Lucrezia Europe M.D.   On: 05/23/2022 19:46   DG Chest 2 View  Result Date: 05/23/2022 CLINICAL DATA:  Hiatal hernia repair December 13, 2021. Chest pain. Burping. Shortness of breath. EXAM: CHEST - 2 VIEW COMPARISON:  Upper GI December 13, 2021.  Upper GI October 04, 2021. FINDINGS: A rounded collection of air is identified posterior to the left side of the heart with an air-fluid level on the lateral view. Probable mild adjacent atelectasis. The heart, hila, mediastinum, lungs, and pleura are otherwise unremarkable. IMPRESSION: A rounded collection of air is identified posterior to the left side of the heart with an air-fluid level on the lateral view is nonspecific. However, a recurrent hiatal hernia is favored. Recommend a CT scan of the chest for further evaluation. Electronically Signed   By: Dorise Bullion III M.D.   On: 05/23/2022 16:23   (Echo, Carotid, EGD, Colonoscopy, ERCP)    Subjective: No complaints  Discharge Exam: Vitals:   05/25/22 0804 05/25/22 1013  BP: (!) 88/50 107/74  Pulse: 86 78  Resp: 18 17  Temp: 98.3 F (36.8 C)   SpO2: 99% 99%   Vitals:   05/25/22 0607 05/25/22 0611 05/25/22 0804 05/25/22 1013  BP: (!) 83/53 (!) 90/50 (!) 88/50 107/74  Pulse:   86 78  Resp:   18 17  Temp:   98.3 F (36.8 C)   TempSrc:   Oral   SpO2:   99% 99%  Weight:      Height:        General: Pt is alert, awake, not in acute distress Cardiovascular: RRR, S1/S2 +, no rubs, no gallops Respiratory: CTA bilaterally, no wheezing, no rhonchi Abdominal: Soft, NT, ND, bowel sounds + Extremities: no edema, no  cyanosis    The results of significant diagnostics from this hospitalization (including imaging, microbiology, ancillary and laboratory) are listed below for reference.     Microbiology: No results found for this or any previous visit (from the past 240 hour(s)).   Labs: BNP (last 3 results) No results for input(s): "BNP" in the last 8760 hours. Basic Metabolic Panel: Recent Labs  Lab 05/23/22 1543 05/24/22 0405  NA 140 137  K 2.9* 4.1  CL 107 108  CO2 20* 19*  GLUCOSE 99 123*  BUN 7 6  CREATININE 0.93 0.76  CALCIUM 8.9 8.8*   Liver Function Tests: Recent Labs  Lab 05/23/22 1543 05/24/22 0405  AST 23 17  ALT 29 26  ALKPHOS 52 48  BILITOT 0.3 0.3  PROT 6.7 6.4*  ALBUMIN 3.7 3.4*   Recent Labs  Lab 05/23/22 1543  LIPASE 32   No results for input(s): "AMMONIA" in the last 168 hours. CBC: Recent Labs  Lab 05/23/22 1543 05/24/22 0405  WBC 7.9 6.7  NEUTROABS 5.4  --   HGB 11.5* 11.7*  HCT 35.1* 33.8*  MCV 94.4 91.8  PLT 361 343   Cardiac Enzymes: No results for input(s): "CKTOTAL", "CKMB", "CKMBINDEX", "TROPONINI" in the last 168 hours. BNP: Invalid input(s): "POCBNP"  CBG: No results for input(s): "GLUCAP" in the last 168 hours. D-Dimer No results for input(s): "DDIMER" in the last 72 hours. Hgb A1c No results for input(s): "HGBA1C" in the last 72 hours. Lipid Profile No results for input(s): "CHOL", "HDL", "LDLCALC", "TRIG", "CHOLHDL", "LDLDIRECT" in the last 72 hours. Thyroid function studies No results for input(s): "TSH", "T4TOTAL", "T3FREE", "THYROIDAB" in the last 72 hours.  Invalid input(s): "FREET3" Anemia work up No results for input(s): "VITAMINB12", "FOLATE", "FERRITIN", "TIBC", "IRON", "RETICCTPCT" in the last 72 hours. Urinalysis    Component Value Date/Time   COLORURINE YELLOW 05/23/2022 1705   APPEARANCEUR HAZY (A) 05/23/2022 1705   APPEARANCEUR Hazy 07/22/2013 0258   LABSPEC 1.013 05/23/2022 1705   LABSPEC 1.021 07/22/2013  0258   PHURINE 7.0 05/23/2022 1705   GLUCOSEU NEGATIVE 05/23/2022 1705   GLUCOSEU Negative 07/22/2013 0258   HGBUR LARGE (A) 05/23/2022 1705   BILIRUBINUR NEGATIVE 05/23/2022 1705   BILIRUBINUR Negative 07/22/2013 0258   KETONESUR 5 (A) 05/23/2022 1705   PROTEINUR NEGATIVE 05/23/2022 1705   UROBILINOGEN 0.2 11/07/2011 0155   NITRITE NEGATIVE 05/23/2022 1705   LEUKOCYTESUR SMALL (A) 05/23/2022 1705   LEUKOCYTESUR 1+ 07/22/2013 0258   Sepsis Labs Recent Labs  Lab 05/23/22 1543 05/24/22 0405  WBC 7.9 6.7   Microbiology No results found for this or any previous visit (from the past 240 hour(s)).   SIGNED:   Charlynne Cousins, MD  [facility] 05/25/2022, 1:35 PM Pager   If 7PM-7AM, please contact night-coverage [email or web address] Password [email or web address]

## 2022-05-25 NOTE — Progress Notes (Signed)
E Ouma NP [facility] was notified via chat that pbp 80/44 dinamap rechecked manual 86/50.

## 2022-05-29 ENCOUNTER — Telehealth: Payer: Self-pay

## 2022-05-29 NOTE — Telephone Encounter (Signed)
Attempted to contact pt to inform her that an OV is needed for pain med.   I was unable to leave VM due to VM being full and could not accept any messages at this time.

## 2022-05-29 NOTE — Telephone Encounter (Addendum)
Spoke with the pt and she has stated she is still in pain and is wanting to know if she can get a refill on her pain medication she received at discharge oxyCODONE (OXY IR/ROXICODONE) 5 MG immediate release tablet. Pt has apptmnt with Dr. Redmond Pulling who did her surgery back in August. Pt states her Hiatal Hernia has returned and she is still in pain and is needing medication sent in until her upcoming consultation apptmnt with Dr. Redmond Pulling?  **I was able to inform the pt that she most likely will need an OV with [facility] she stated she is open to this however, she wants to know if really needs to come in or is she to see her GI doctor for the above matter?

## 2022-05-29 NOTE — Transitions of Care (Post Inpatient/ED Visit) (Signed)
   05/29/2022  Name: Joan Simpson MRN: [id number] DOB: 25-Jan-1991  Today's TOC FU Call Status: Today's TOC FU Call Status:: Successful TOC FU Call Competed Unsuccessful Call (1st Attempt) Date: 05/29/22 [facility] FU Call Complete Date: 05/29/22  Transition Care Management Follow-up Telephone Call Date of Discharge: 05/25/22 Discharge Facility: [facility] Type of Discharge: Inpatient Admission Primary Inpatient Discharge Diagnosis:: hernia Reason for ED Visit: Other: How have you been since you were released from the hospital?: Same Any questions or concerns?: Yes Patient Questions/Concerns:: pt needs refills on pain meds  Items Reviewed: Did you receive and understand the discharge instructions provided?: Yes Medications obtained and verified?: Yes (Medications Reviewed) Any new allergies since your discharge?: No Dietary orders reviewed?: Yes Type of Diet Ordered:: liquid Do you have support at home?: No  Home Care and Equipment/Supplies: [place] Ordered?: NA Any new equipment or medical supplies ordered?: NA  Functional Questionnaire: Do you need assistance with bathing/showering or dressing?: No Do you need assistance with meal preparation?: No Do you need assistance with eating?: No Do you have difficulty maintaining continence: No Do you need assistance with getting out of bed/getting out of a chair/moving?: No Do you have difficulty managing or taking your medications?: No  Folllow up appointments reviewed: PCP Follow-up appointment confirmed?: No MD Provider Line [phone] Given: Yes Follow-up Provider: Dr Ronnald Ramp [facility] Follow-up appointment confirmed?: Yes Date of Specialist follow-up appointment?: 06/08/22 Follow-Up Specialty Provider:: Dr Redmond Pulling Do you need transportation to your follow-up appointment?: No Do you understand care options if your condition(s) worsen?: Yes-patient verbalized understanding    [place] LPN [place] [phone]

## 2022-05-29 NOTE — Transitions of Care (Post Inpatient/ED Visit) (Signed)
   05/29/2022  Name: Joan Simpson MRN: [id number] DOB: 04/28/1990  Today's TOC FU Call Status: Today's TOC FU Call Status:: Unsuccessul Call (1st Attempt) Unsuccessful Call (1st Attempt) Date: 05/29/22  Attempted to reach the patient regarding the most recent Inpatient/ED visit.  Follow Up Plan: Additional outreach attempts will be made to reach the patient to complete the Transitions of Care (Post Inpatient/ED visit) call.   [place] LPN [place] Advisor Direct Dial [phone]

## 2022-08-08 ENCOUNTER — Ambulatory Visit: Payer: 59 | Admitting: Gastroenterology

## 2022-08-08 ENCOUNTER — Encounter: Payer: Self-pay | Admitting: Gastroenterology

## 2022-08-08 VITALS — BP 118/68 | HR 93 | Ht 61.0 in | Wt 146.0 lb

## 2022-08-08 DIAGNOSIS — R1319 Other dysphagia: Secondary | ICD-10-CM

## 2022-08-08 DIAGNOSIS — K219 Gastro-esophageal reflux disease without esophagitis: Secondary | ICD-10-CM | POA: Diagnosis not present

## 2022-08-08 DIAGNOSIS — K449 Diaphragmatic hernia without obstruction or gangrene: Secondary | ICD-10-CM

## 2022-08-08 DIAGNOSIS — R110 Nausea: Secondary | ICD-10-CM

## 2022-08-08 NOTE — Progress Notes (Unsigned)
GASTROENTEROLOGY OUTPATIENT CLINIC VISIT   Primary Care Provider Etta Grandchild, MD [address] [place] [address] [phone]   Patient Profile: Joan Simpson is a 32 y.o. female with a pmh significant for bipolar disorder, MDD, anxiety, hypertension, GERD, large hiatal hernia, hemorrhoids, colon polyps (TA).  The patient presents to the [facility] for an evaluation and management of problem(s) noted below:  Problem List No diagnosis found.   History of Present Illness Please see prior notes for full details of HPI.  Interval History The patient returns for follow-up.  She continues to experience significant heartburn.  She is experiencing episodes of nausea with vomiting on a near nightly basis.  She is using antiemetics on a near daily basis.  She has had to use Tums with some effectiveness.  We had transitioned her from her last PPI to Aciphex.  She does notice some improvement in her GERD symptoms but it cannot get her heartburn under complete control.  She does know however if she stopped Aciphex she certainly would feel much worse.  She has not had any unintentional weight loss.  The antispasmodic has been helpful for her lower abdominal cramping.  She has had some rectal itching which has been attributed now to her internal hemorrhoids and she has not used Preparation H or Anusol suppositories in the past.  During her endoscopy biopsies there was some findings that suggested potential need for rule out of C VID as well as celiac and her serologies do not show any evidence of that.Francis Dowse is interested in further management of her symptoms and is ready for a consideration of a surgical referral if we feel it is indicated.  GI Review of Systems Positive as above Negative for dysphagia, odynophagia, decreased appetite, alteration of bowel habits, melena, hematochezia  Review of Systems General: Denies fevers/chills/weight loss  unintentionally Cardiovascular: Denies chest pain/ Pulmonary: Denies shortness of breath Gastroenterological: See HPI Genitourinary: Denies darkened urine Hematological: Denies easy bruising/bleeding Dermatological: Denies jaundice Psychological: Mood is stable   Medications Current Outpatient Medications  Medication Sig Dispense Refill   aspirin-acetaminophen-caffeine (EXCEDRIN MIGRAINE) 250-250-65 MG tablet Take 2 tablets by mouth daily as needed for headache.     buPROPion (WELLBUTRIN XL) 300 MG 24 hr tablet Take 300 mg by mouth daily.     Cyanocobalamin (VITAMIN B-12 PO) Take 1 tablet by mouth daily.     etonogestrel (NEXPLANON) 68 MG IMPL implant 1 each by Subdermal route once.     lamoTRIgine (LAMICTAL) 200 MG tablet Take 200 mg by mouth daily.     ondansetron (ZOFRAN-ODT) 8 MG disintegrating tablet Dissolve 1 tablet by mouth every 8 hours as needed for nausea or vomiting. 30 tablet 1   oxyCODONE (OXY IR/ROXICODONE) 5 MG immediate release tablet Take 1 tablet by mouth every 6 hours as needed for severe pain. 10 tablet 0   promethazine (PHENERGAN) 12.5 MG tablet Take 1 tablet (12.5 mg total) by mouth every 8 (eight) hours as needed for vomiting or refractory nausea / vomiting. 15 tablet 0   sucralfate (CARAFATE) 1 GM/10ML suspension Take 10 mLs (1 g total) by mouth 4 (four) times daily -  with meals and at bedtime. 420 mL 2   pantoprazole (PROTONIX) 40 MG tablet Take 1 tablet (40 mg total) by mouth 2 (two) times daily. 30 tablet 1   No current facility-administered medications for this visit.    Allergies Allergies  Allergen Reactions   Erythromycin Anaphylaxis and Swelling  Says she throws up and can't breath     Histories Past Medical History:  Diagnosis Date   Allergy    Anemia    Anxiety    Bipolar 1 disorder    Depression    Hypertension    Nexplanon in place 12/09/2021   left upper arm, use caution when using left upper arm, no B/P cuff to that area please    Pre-diabetes    Past Surgical History:  Procedure Laterality Date   NO PAST SURGERIES     UPPER GI ENDOSCOPY N/A 12/12/2021   Procedure: UPPER GI ENDOSCOPY;  Surgeon: [name], MD;  Location: [facility];  Service: General;  Laterality: N/A;   XI ROBOTIC ASSISTED HIATAL HERNIA REPAIR N/A 12/12/2021   Procedure: XI ROBOTIC ASSISTED HIATAL HERNIA  REPAIR WITH FUNDOPLICATION;  Surgeon: [name], MD;  Location: [facility];  Service: General;  Laterality: N/A;   Social History   Socioeconomic History   Marital status: Single    Spouse name: Not on file   Number of children: 1   Years of education: Not on file   Highest education level: Not on file  Occupational History   Occupation: Engineer, structural  Tobacco Use   Smoking status: Never    Passive exposure: Never   Smokeless tobacco: Never  Vaping Use   Vaping Use: Never used  Substance and Sexual Activity   Alcohol use: Not Currently    Comment: once-twice a year   Drug use: Yes    Comment: CBD gums   Sexual activity: Yes    Partners: Male    Birth control/protection: Implant  Other Topics Concern   Not on file  Social History Narrative   Not on file   Social Determinants of Health   Financial Resource Strain: Not on file  Food Insecurity: No Food Insecurity (05/24/2022)   Hunger Vital Sign    Worried About Running Out of Food in the Last Year: Never true    Ran Out of Food in the Last Year: Never true  Transportation Needs: No Transportation Needs (05/24/2022)   PRAPARE - Administrator, Civil Service (Medical): No    Lack of Transportation (Non-Medical): No  Physical Activity: Not on file  Stress: Not on file  Social Connections: Not on file  Intimate Partner Violence: Not At Risk (05/24/2022)   Humiliation, Afraid, Rape, and Kick questionnaire    Fear of Current or Ex-Partner: No    Emotionally Abused: No    Physically Abused: No    Sexually Abused: No   Family History  Problem Relation Age of Onset    Diabetes Mother    Hypertension Mother    Heart disease Mother    Other Father        prediabetes   Hypertension Father    Diabetes Brother    Heart disease Brother    Diabetes Paternal Grandmother    Colon cancer Neg Hx    Esophageal cancer Neg Hx    Inflammatory bowel disease Neg Hx    Liver disease Neg Hx    Pancreatic cancer Neg Hx    Rectal cancer Neg Hx    Stomach cancer Neg Hx    I have reviewed her medical, social, and family history in detail and updated the electronic medical record as necessary.    PHYSICAL EXAMINATION  BP 118/68   Pulse 93   Ht 5\' 1"  (1.549 m)   Wt 146 lb (66.2 kg)   BMI  27.59 kg/m  Wt Readings from Last 3 Encounters:  08/08/22 146 lb (66.2 kg)  05/24/22 146 lb 13.2 oz (66.6 kg)  12/12/21 164 lb 14.5 oz (74.8 kg)  GEN: NAD, appears stated age, doesn't appear chronically ill PSYCH: Cooperative, without pressured speech EYE: Conjunctivae pink, sclerae anicteric ENT: MMM CV: Nontachycardic RESP: No audible wheezing GI: NABS, soft, NT mildly protuberant, without rebound or guarding MSK/EXT: No lower extremity edema SKIN: No jaundice NEURO:  Alert & Oriented x 3, no focal deficits   REVIEW OF DATA  I reviewed the following data at the time of this encounter:  GI Procedures and Studies  March 2023 EGD - No gross lesions in esophagus. Biopsied. - Z-line regular, 33 cm from the incisors. - 6 cm hiatal hernia. - Erythematous mucosa in the antrum. - No gross lesions in the stomach. Biopsied. - No gross lesions in the duodenal bulb, in the first portion of the duodenum and in the second portion of the duodenum. Biopsied.  March 2023 colonoscopy - Hemorrhoids found on digital rectal exam. - One 6 mm polyp at the hepatic flexure, removed with a cold snare. Resected and retrieved. - Normal mucosa in the entire examined colon otherwise. - Non-bleeding non-thrombosed internal hemorrhoids.  Pathology Diagnosis 1. Surgical [P], mid esophagus  and distal esophagus - ESOPHAGEAL SQUAMOUS MUCOSA WITH NO SPECIFIC HISTOPATHOLOGIC CHANGES - NEGATIVE FOR INCREASED INTRAEPITHELIAL EOSINOPHILS 2. Surgical [P], gastric antrum and gastric body - GASTRIC ANTRAL MUCOSA WITH MILD NONSPECIFIC REACTIVE GASTROPATHY - GASTRIC OXYNTIC MUCOSA WITH NO SPECIFIC HISTOPATHOLOGIC CHANGES - HELICOBACTER PYLORI-LIKE ORGANISMS ARE NOT IDENTIFIED ON ROUTINE H&E STAIN 3. Surgical [P], duodenal bulb, 2nd portion of duodenum, and distal duodenum - DUODENAL MUCOSA WITH PATCHY INTRAEPITHELIAL LYMPHOCYTOSIS AND PRESERVED VILLOUS ARCHITECTURE. SEE NOTE 4. Surgical [P], colon, hepatic flexure, polyp (1) - SESSILE SERRATED POLYP WITHOUT CYTOLOGIC DYSPLASIA  Laboratory Studies  Those in epic  Imaging Studies  April 2023 abdominal ultrasound right upper quadrant IMPRESSION: No acute abnormality noted.   ASSESSMENT  Ms. Riera is a 32 y.o. female with a pmh significant for bipolar disorder, MDD, anxiety, hypertension, GERD, large hiatal hernia, hemorrhoids, colon polyps (TA).  The patient is seen today for evaluation and management of:  No diagnosis found.  The patient is hemodynamically stable.  Clinically however seems that her GERD symptoms are still significant issue.  I believe her issue with her progressive and persistent symptoms is a result of her large hiatal hernia.  Although 1 could consider functional heartburn as well as esophageal hypersensitivity in the work-up of persistent heartburn like symptoms, or anatomical issue with the large hiatal hernia is most likely playing a bigger role.  We will go ahead and move forward with scheduling an esophageal manometry and pH impedance testing but we will also place a referral to our surgical colleagues to see if they would meet the patient to consider hiatal hernia repair and fundoplication.  If they see the patient before her manometry/pH impedance testing is performed, then they can decide if they need this  testing or not.  Thankfully she has not had any dysphagia symptoms.  I have asked the patient to try Gaviscon if she has breakthrough symptoms.  I have asked the patient to transition a dose of her antiemetic to at bedtime and I hope that will help decrease some of her symptoms overnight.  Have asked the patient to get a bed wedge/bed block in an effort of trying to optimize angles and lower less symptoms as well.  Weight loss could be helpful as well.  Time will tell.  I believe her infrequent rectal itching is most likely hemorrhoidal in nature and she can use Preparation H if need be.  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  Her right upper quadrant ultrasound did not show any significant abnormalities.  If the surgeons feel additional cross-sectional imaging is required or the patient develops severe abdominal pain we will consider a CT abdomen/pelvis with IV and oral contrast.  All patient questions were answered to the best of my ability, and the patient agrees to the aforementioned plan of action with follow-up as indicated.   PLAN  Continue Aciphex twice daily Consider Gaviscon for as needed heartburn progressive needs but if not helpful then can use Tums or Pepcid as she has done in the past Try a dose of Zofran at bedtime to see if that will decrease symptoms overnight of nausea leading to vomiting Bed wedge/bed block discussed with patient 2 by Esophageal manometry/pH impedance testing will be scheduled though may not be required by her surgeons Surgery referral for hiatal hernia repair/fundoplication consultation Preparation H or Anusol suppositories in future if rectal itching persist 2026 colonoscopy for surveillance in the setting of her young age and finding of a tubular adenoma   No orders of the defined types were placed in this encounter.   New Prescriptions   No medications on file   Modified Medications    No medications on file    Planned Follow Up No follow-ups on file.   Total Time in Face-to-Face and in Coordination of Care for patient including independent/personal interpretation/review of prior testing, medical history, examination, medication adjustment, communicating results with the patient directly, and documentation within the EHR is 25 minutes.   Corliss Parish, MD [facility] Advanced [facility]oscopy Office # [phone]

## 2022-08-08 NOTE — Patient Instructions (Signed)
You have been scheduled for an endoscopy. Please follow written instructions given to you at your visit today. If you use inhalers (even only as needed), please bring them with you on the day of your procedure.  _______________________________________________________  If your blood pressure at your visit was 140/90 or greater, please contact your primary care physician to follow up on this.  _______________________________________________________  If you are age 31 or older, your body mass index should be between 23-30. Your Body mass index is 27.59 kg/m. If this is out of the aforementioned range listed, please consider follow up with your Primary Care Provider.  If you are age 70 or younger, your body mass index should be between 19-25. Your Body mass index is 27.59 kg/m. If this is out of the aformentioned range listed, please consider follow up with your Primary Care Provider.   ________________________________________________________  The [facility] providers would like to encourage you to use [facility] to communicate with providers for non-urgent requests or questions.  Due to long hold times on the telephone, sending your provider a message by [facility] may be a faster and more efficient way to get a response.  Please allow 48 business hours for a response.  Please remember that this is for non-urgent requests.  _______________________________________________________  Thank you for choosing me and [facility].  Dr. Meridee Score

## 2022-08-10 ENCOUNTER — Encounter: Payer: Self-pay | Admitting: Gastroenterology

## 2022-08-10 DIAGNOSIS — R1319 Other dysphagia: Secondary | ICD-10-CM | POA: Insufficient documentation

## 2022-08-10 DIAGNOSIS — R110 Nausea: Secondary | ICD-10-CM | POA: Insufficient documentation

## 2022-08-10 DIAGNOSIS — K219 Gastro-esophageal reflux disease without esophagitis: Secondary | ICD-10-CM | POA: Insufficient documentation

## 2022-08-16 ENCOUNTER — Encounter: Payer: Self-pay | Admitting: Gastroenterology

## 2022-08-16 ENCOUNTER — Ambulatory Visit (AMBULATORY_SURGERY_CENTER): Payer: 59 | Admitting: Gastroenterology

## 2022-08-16 VITALS — BP 95/59 | HR 87 | Temp 98.9°F | Resp 18 | Ht 61.0 in | Wt 146.0 lb

## 2022-08-16 DIAGNOSIS — K259 Gastric ulcer, unspecified as acute or chronic, without hemorrhage or perforation: Secondary | ICD-10-CM | POA: Diagnosis present

## 2022-08-16 DIAGNOSIS — K219 Gastro-esophageal reflux disease without esophagitis: Secondary | ICD-10-CM

## 2022-08-16 DIAGNOSIS — K319 Disease of stomach and duodenum, unspecified: Secondary | ICD-10-CM | POA: Diagnosis not present

## 2022-08-16 DIAGNOSIS — K2950 Unspecified chronic gastritis without bleeding: Secondary | ICD-10-CM | POA: Diagnosis not present

## 2022-08-16 MED ORDER — SODIUM CHLORIDE 0.9 % IV SOLN
500.0000 mL | Freq: Once | INTRAVENOUS | Status: DC
Start: 2022-08-16 — End: 2022-11-22

## 2022-08-16 MED ORDER — SUCRALFATE 1 G PO TABS: 1.0000 g | ORAL_TABLET | Freq: Two times a day (BID) | ORAL | 3 refills | Status: DC

## 2022-08-16 MED ORDER — ONDANSETRON 8 MG PO TBDP: 8.0000 mg | ORAL_TABLET | Freq: Three times a day (TID) | ORAL | 3 refills | Status: DC | PRN

## 2022-08-16 MED ORDER — RABEPRAZOLE SODIUM 20 MG PO TBEC: 20.0000 mg | DELAYED_RELEASE_TABLET | Freq: Two times a day (BID) | ORAL | 3 refills | Status: DC

## 2022-08-16 NOTE — Progress Notes (Unsigned)
Vitals-Joan Simpson  Pt's states no medical or surgical changes since previsit or office visit. 

## 2022-08-16 NOTE — Progress Notes (Signed)
GASTROENTEROLOGY PROCEDURE H&P NOTE   Primary Care Physician: Etta Grandchild, MD  HPI: Joan Simpson is a 32 y.o. female who presents for EGD for evaluation of dysphagia, recurrent HH, N/V.  Past Medical History:  Diagnosis Date   Allergy    Anemia    Anxiety    Bipolar 1 disorder (HCC)    Depression    Hypertension    Nexplanon in place 12/09/2021   left upper arm, use caution when using left upper arm, no B/P cuff to that area please   Pre-diabetes    Past Surgical History:  Procedure Laterality Date   NO PAST SURGERIES     UPPER GI ENDOSCOPY N/A 12/12/2021   Procedure: UPPER GI ENDOSCOPY;  Surgeon: [name], MD;  Location: [facility];  Service: General;  Laterality: N/A;   XI ROBOTIC ASSISTED HIATAL HERNIA REPAIR N/A 12/12/2021   Procedure: XI ROBOTIC ASSISTED HIATAL HERNIA  REPAIR WITH FUNDOPLICATION;  Surgeon: [name], MD;  Location: [facility];  Service: General;  Laterality: N/A;   Current Outpatient Medications  Medication Sig Dispense Refill   aspirin-acetaminophen-caffeine (EXCEDRIN MIGRAINE) 250-250-65 MG tablet Take 2 tablets by mouth daily as needed for headache.     buPROPion (WELLBUTRIN XL) 300 MG 24 hr tablet Take 300 mg by mouth daily.     Cyanocobalamin (VITAMIN B-12 PO) Take 1 tablet by mouth daily.     etonogestrel (NEXPLANON) 68 MG IMPL implant 1 each by Subdermal route once.     lamoTRIgine (LAMICTAL) 200 MG tablet Take 200 mg by mouth daily.     ondansetron (ZOFRAN-ODT) 8 MG disintegrating tablet Dissolve 1 tablet by mouth every 8 hours as needed for nausea or vomiting. 30 tablet 1   oxyCODONE (OXY IR/ROXICODONE) 5 MG immediate release tablet Take 1 tablet by mouth every 6 hours as needed for severe pain. 10 tablet 0   pantoprazole (PROTONIX) 40 MG tablet Take 1 tablet (40 mg total) by mouth 2 (two) times daily. 30 tablet 1   promethazine (PHENERGAN) 12.5 MG tablet Take 1 tablet (12.5 mg total) by mouth every 8 (eight) hours as needed for vomiting  or refractory nausea / vomiting. 15 tablet 0   sucralfate (CARAFATE) 1 GM/10ML suspension Take 10 mLs (1 g total) by mouth 4 (four) times daily -  with meals and at bedtime. 420 mL 2   Current Facility-Administered Medications  Medication Dose Route Frequency Provider Last Rate Last Admin   0.9 %  sodium chloride infusion  500 mL Intravenous Once Mansouraty, Netty Starring., MD        Current Outpatient Medications:    aspirin-acetaminophen-caffeine (EXCEDRIN MIGRAINE) 250-250-65 MG tablet, Take 2 tablets by mouth daily as needed for headache., Disp: , Rfl:    buPROPion (WELLBUTRIN XL) 300 MG 24 hr tablet, Take 300 mg by mouth daily., Disp: , Rfl:    Cyanocobalamin (VITAMIN B-12 PO), Take 1 tablet by mouth daily., Disp: , Rfl:    etonogestrel (NEXPLANON) 68 MG IMPL implant, 1 each by Subdermal route once., Disp: , Rfl:    lamoTRIgine (LAMICTAL) 200 MG tablet, Take 200 mg by mouth daily., Disp: , Rfl:    ondansetron (ZOFRAN-ODT) 8 MG disintegrating tablet, Dissolve 1 tablet by mouth every 8 hours as needed for nausea or vomiting., Disp: 30 tablet, Rfl: 1   oxyCODONE (OXY IR/ROXICODONE) 5 MG immediate release tablet, Take 1 tablet by mouth every 6 hours as needed for severe pain., Disp: 10 tablet, Rfl: 0   pantoprazole (PROTONIX)  40 MG tablet, Take 1 tablet (40 mg total) by mouth 2 (two) times daily., Disp: 30 tablet, Rfl: 1   promethazine (PHENERGAN) 12.5 MG tablet, Take 1 tablet (12.5 mg total) by mouth every 8 (eight) hours as needed for vomiting or refractory nausea / vomiting., Disp: 15 tablet, Rfl: 0   sucralfate (CARAFATE) 1 GM/10ML suspension, Take 10 mLs (1 g total) by mouth 4 (four) times daily -  with meals and at bedtime., Disp: 420 mL, Rfl: 2  Current Facility-Administered Medications:    0.9 %  sodium chloride infusion, 500 mL, Intravenous, Once, Mansouraty, Netty Starring., MD Allergies  Allergen Reactions   Erythromycin Anaphylaxis and Swelling    Says she throws up and can't breath     Family History  Problem Relation Age of Onset   Diabetes Mother    Hypertension Mother    Heart disease Mother    Other Father        prediabetes   Hypertension Father    Diabetes Brother    Heart disease Brother    Diabetes Paternal Grandmother    Colon cancer Neg Hx    Esophageal cancer Neg Hx    Inflammatory bowel disease Neg Hx    Liver disease Neg Hx    Pancreatic cancer Neg Hx    Rectal cancer Neg Hx    Stomach cancer Neg Hx    Social History   Socioeconomic History   Marital status: Single    Spouse name: Not on file   Number of children: 1   Years of education: Not on file   Highest education level: Not on file  Occupational History   Occupation: Engineer, structural  Tobacco Use   Smoking status: Never    Passive exposure: Never   Smokeless tobacco: Never  Vaping Use   Vaping Use: Never used  Substance and Sexual Activity   Alcohol use: Not Currently    Comment: once-twice a year   Drug use: Yes    Comment: CBD gums   Sexual activity: Yes    Partners: Male    Birth control/protection: Implant  Other Topics Concern   Not on file  Social History Narrative   Not on file   Social Determinants of Health   Financial Resource Strain: Not on file  Food Insecurity: No Food Insecurity (05/24/2022)   Hunger Vital Sign    Worried About Running Out of Food in the Last Year: Never true    Ran Out of Food in the Last Year: Never true  Transportation Needs: No Transportation Needs (05/24/2022)   PRAPARE - Administrator, Civil Service (Medical): No    Lack of Transportation (Non-Medical): No  Physical Activity: Not on file  Stress: Not on file  Social Connections: Not on file  Intimate Partner Violence: Not At Risk (05/24/2022)   Humiliation, Afraid, Rape, and Kick questionnaire    Fear of Current or Ex-Partner: No    Emotionally Abused: No    Physically Abused: No    Sexually Abused: No    Physical Exam: Today's Vitals   08/16/22 1248  BP:  (!) 100/57  Pulse: 87  Temp: 98.9 F (37.2 C)  TempSrc: Temporal  SpO2: 100%  Weight: 146 lb (66.2 kg)  Height: 5\' 1"  (1.549 m)  PainSc: 0-No pain   Body mass index is 27.59 kg/m. GEN: NAD EYE: Sclerae anicteric ENT: MMM CV: Non-tachycardic GI: Soft, NT/ND NEURO:  Alert & Oriented x 3  Lab Results: No results  for input(s): "WBC", "HGB", "HCT", "PLT" in the last 72 hours. BMET No results for input(s): "NA", "K", "CL", "CO2", "GLUCOSE", "BUN", "CREATININE", "CALCIUM" in the last 72 hours. LFT No results for input(s): "PROT", "ALBUMIN", "AST", "ALT", "ALKPHOS", "BILITOT", "BILIDIR", "IBILI" in the last 72 hours. PT/INR No results for input(s): "LABPROT", "INR" in the last 72 hours.   Impression / Plan: This is a 32 y.o.female who presents for EGD for evaluation of dysphagia, recurrent HH, N/V.  The risks and benefits of endoscopic evaluation/treatment were discussed with the patient and/or family; these include but are not limited to the risk of perforation, infection, bleeding, missed lesions, lack of diagnosis, severe illness requiring hospitalization, as well as anesthesia and sedation related illnesses.  The patient's history has been reviewed, patient examined, no change in status, and deemed stable for procedure.  The patient and/or family is agreeable to proceed.    Corliss Parish, MD [facility] Advanced [facility]oscopy Office # [phone]

## 2022-08-16 NOTE — Op Note (Signed)
[facility] Patient Name: Joan Simpson Procedure Date: 08/16/2022 12:59 PM MRN: [id number] Endoscopist: Corliss Parish , MD, [id number] Age: 32 Referring MD:  Date of Birth: February 28, 1991 Gender: Female Account #: [id number] Procedure:                Upper GI endoscopy Indications:              Epigastric abdominal pain, Dysphagia, Follow-up of                            hiatal hernia, Nausea with vomiting Medicines:                Monitored Anesthesia Care Procedure:                Pre-Anesthesia Assessment:                           - Prior to the procedure, a History and Physical                            was performed, and patient medications and                            allergies were reviewed. The patient's tolerance of                            previous anesthesia was also reviewed. The risks                            and benefits of the procedure and the sedation                            options and risks were discussed with the patient.                            All questions were answered, and informed consent                            was obtained. Prior Anticoagulants: The patient has                            taken no anticoagulant or antiplatelet agents. ASA                            Grade Assessment: II - A patient with mild systemic                            disease. After reviewing the risks and benefits,                            the patient was deemed in satisfactory condition to                            undergo the procedure.  After obtaining informed consent, the endoscope was                            passed under direct vision. Throughout the                            procedure, the patient's blood pressure, pulse, and                            oxygen saturations were monitored continuously. The                            GIF [id number] #[id number] was introduced through the                            mouth, and  advanced to the second part of duodenum.                            The upper GI endoscopy was accomplished without                            difficulty. The patient tolerated the procedure. Scope In: Scope Out: Findings:                 No gross lesions were noted in the entire esophagus.                           The Z-line was irregular and was found 34 cm from                            the incisors.                           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 The                            proximal extent of the gastric folds (end of                            tubular esophagus) was 35 cm from the incisors. The                            hiatal narrowing was 43 cm from the incisors. The                            Z-line was 35 cm from the incisors.                           Two non-bleeding linear gastric ulcers with a clean                            ulcer base (Forrest Class III) were found in the  gastric antrum. The largest lesion was 10 mm in                            largest dimension.                           Patchy mild inflammation characterized by erosions                            and erythema was found in the entire examined                            stomach. Biopsies were taken with a cold forceps                            for histology and Helicobacter pylori testing.                           No gross lesions were noted in the duodenal bulb,                            in the first portion of the duodenum and in the                            second portion of the duodenum. Biopsies were taken                            with a cold forceps for histology. Complications:            No immediate complications. Estimated Blood Loss:     Estimated blood loss was minimal. Impression:               - No gross lesions in the entire esophagus.                           - Z-line irregular, 34 cm from the incisors.                           -  8 cm paraesophageal hernia.                           - Non-bleeding gastric ulcers with a clean ulcer                            base (Forrest Class III).                           - Gastritis. Biopsied.                           - No gross lesions in the duodenal bulb, in the                            first portion of the duodenum and in the second  portion of the duodenum. Biopsied. Recommendation:           - The patient will be observed post-procedure,                            until all discharge criteria are met.                           - Discharge patient to home.                           - Patient has a contact number available for                            emergencies. The signs and symptoms of potential                            delayed complications were discussed with the                            patient. Return to normal activities tomorrow.                            Written discharge instructions were provided to the                            patient.                           - Resume previous diet.                           - Recommend transition from Protonix to Aciphex 20                            mg twice daily                           - Start Carafate 1 g twice daily.                           - Continue present medications.                           - Query the possibility of performing a gastric                            emptying study though based on the significant                            recurrence of the hiatal hernia and paraesophageal                            herniation of the stomach, I think this is probably  playing the largest role in regards to her                            symptoms. The possibility of the ulcer disease                            causing symptoms as well needs to be considered. I                            am going to see hopefully with treating her PUD                             more aggressively if this helps her symptoms.                           - Await pathology results.                           - Repeat upper endoscopy in 4 months to check                            healing.                           - The findings and recommendations were discussed                            with the patient.                           - The findings and recommendations were discussed                            with the patient's family. Corliss Parish, MD 08/16/2022 2:53:22 PM

## 2022-08-16 NOTE — Progress Notes (Unsigned)
Called to room to assist during endoscopic procedure.  Patient ID and intended procedure confirmed with present staff. Received instructions for my participation in the procedure from the performing physician.  

## 2022-08-16 NOTE — Patient Instructions (Signed)
Transition from Protonix to Aciphex 20mg  twice daily Start Carafate 1 G twice daily  Continue present medications Query the possibility of performing a gastric emptying study  Awaiting pathology results Repeat Upper Endoscopy in 4 months to check healing  YOU HAD AN ENDOSCOPIC PROCEDURE TODAY AT THE [facility]:   Refer to the procedure report that was given to you for any specific questions about what was found during the examination.  If the procedure report does not answer your questions, please call your gastroenterologist to clarify.  If you requested that your care partner not be given the details of your procedure findings, then the procedure report has been included in a sealed envelope for you to review at your convenience later.  YOU SHOULD EXPECT: Some feelings of bloating in the abdomen. Passage of more gas than usual.  Walking can help get rid of the air that was put into your GI tract during the procedure and reduce the bloating. If you had a lower endoscopy (such as a colonoscopy or flexible sigmoidoscopy) you may notice spotting of blood in your stool or on the toilet paper. If you underwent a bowel prep for your procedure, you may not have a normal bowel movement for a few days.  Please Note:  You might notice some irritation and congestion in your nose or some drainage.  This is from the oxygen used during your procedure.  There is no need for concern and it should clear up in a day or so.  SYMPTOMS TO REPORT IMMEDIATELY:  Following upper endoscopy (EGD)  Vomiting of blood or coffee ground material  New chest pain or pain under the shoulder blades  Painful or persistently difficult swallowing  New shortness of breath  Fever of 100F or higher  Black, tarry-looking stools  For urgent or emergent issues, a gastroenterologist can be reached at any hour by calling [phone]. Do not use MyChart messaging for urgent concerns.    DIET:  We do recommend a small  meal at first, but then you may proceed to your regular diet.  Drink plenty of fluids but you should avoid alcoholic beverages for 24 hours.  ACTIVITY:  You should plan to take it easy for the rest of today and you should NOT DRIVE or use heavy machinery until tomorrow (because of the sedation medicines used during the test).    FOLLOW UP: Our staff will call the number listed on your records the next business day following your procedure.  We will call around 7:15- 8:00 am to check on you and address any questions or concerns that you may have regarding the information given to you following your procedure. If we do not reach you, we will leave a message.     If any biopsies were taken you will be contacted by phone or by letter within the next 1-3 weeks.  Please call us at [phone] if you have not heard about the biopsies in 3 weeks.    SIGNATURES/CONFIDENTIALITY: You and/or your care partner have signed paperwork which will be entered into your electronic medical record.  These signatures attest to the fact that that the information above on your After Visit Summary has been reviewed and is understood.  Full responsibility of the confidentiality of this discharge information lies with you and/or your care-partner.

## 2022-08-16 NOTE — Progress Notes (Unsigned)
Uneventful anesthetic. Report to pacu rn. Vss. Care resumed by rn. 

## 2022-08-17 ENCOUNTER — Telehealth: Payer: Self-pay

## 2022-08-17 NOTE — Telephone Encounter (Signed)
Post procedure follow up call, no answer 

## 2022-08-22 ENCOUNTER — Encounter: Payer: Self-pay | Admitting: Gastroenterology

## 2022-09-15 ENCOUNTER — Other Ambulatory Visit (HOSPITAL_COMMUNITY): Payer: Self-pay | Admitting: General Surgery

## 2022-09-15 DIAGNOSIS — K219 Gastro-esophageal reflux disease without esophagitis: Secondary | ICD-10-CM

## 2022-09-15 DIAGNOSIS — K449 Diaphragmatic hernia without obstruction or gangrene: Secondary | ICD-10-CM

## 2022-09-28 ENCOUNTER — Encounter (HOSPITAL_COMMUNITY)
Admission: RE | Admit: 2022-09-28 | Discharge: 2022-09-28 | Disposition: A | Discharge status: Home / Self Care | Payer: 59 | Source: Ambulatory Visit | Attending: General Surgery | Admitting: General Surgery

## 2022-09-28 DIAGNOSIS — K219 Gastro-esophageal reflux disease without esophagitis: Secondary | ICD-10-CM | POA: Diagnosis present

## 2022-09-28 DIAGNOSIS — K449 Diaphragmatic hernia without obstruction or gangrene: Secondary | ICD-10-CM | POA: Diagnosis present

## 2022-09-28 MED ORDER — TECHNETIUM TC 99M SULFUR COLLOID
2.2000 | Freq: Once | INTRAVENOUS | Status: AC | PRN
  Administered 2022-09-28: 2.2 via ORAL

## 2022-11-09 ENCOUNTER — Ambulatory Visit: Payer: Self-pay | Admitting: General Surgery

## 2022-11-09 DIAGNOSIS — K449 Diaphragmatic hernia without obstruction or gangrene: Secondary | ICD-10-CM

## 2022-11-15 NOTE — Patient Instructions (Addendum)
SURGICAL WAITING ROOM VISITATION Patients having surgery or a procedure may have no more than 2 support people in the waiting area - these visitors may rotate.    Children under the age of 80 must have an adult with them who is not the patient.  If the patient needs to stay at the hospital during part of their recovery, the visitor guidelines for inpatient rooms apply. Pre-op nurse will coordinate an appropriate time for 1 support person to accompany patient in pre-op.  This support person may not rotate.    Please refer to the [facility] website for the visitor guidelines for Inpatients (after your surgery is over and you are in a regular room).       Your procedure is scheduled on:  11-21-22   Report to [facility] Main Entrance    Report to admitting at 5:15 AM   Call this number if you have problems the morning of surgery [phone]   Do not eat food :After Midnight.   After Midnight you may have the following liquids until 4:30 AM DAY OF SURGERY  Water Non-Citrus Juices (without pulp, NO RED-Apple, White grape, White cranberry) Black Coffee (NO MILK/CREAM OR CREAMERS, sugar ok)  Clear Tea (NO MILK/CREAM OR CREAMERS, sugar ok) regular and decaf                             Plain Jell-O (NO RED)                                           Fruit ices (not with fruit pulp, NO RED)                                     Popsicles (NO RED)                                                               Sports drinks like Gatorade (NO RED)                      If you have questions, please contact your surgeon's office.   FOLLOW  ANY ADDITIONAL PRE OP INSTRUCTIONS YOU RECEIVED FROM YOUR SURGEON'S OFFICE!!!     Oral Hygiene is also important to reduce your risk of infection.                                    Remember - BRUSH YOUR TEETH THE MORNING OF SURGERY WITH YOUR REGULAR TOOTHPASTE   Do NOT smoke after Midnight   Take these medicines the morning of surgery with A SIP  OF WATER:   Aciphex  Bupropion  LamoTrigine  Ondansetron/Promethazine if needed  Stop all vitamins and herbal supplements 7 days before surgery                              You may not have any metal on your body including  hair pins, jewelry, and body piercing             Do not wear make-up, lotions, powders, perfumes or deodorant  Do not wear nail polish including gel and S&S, artificial/acrylic nails, or any other type of covering on natural nails including finger and toenails. If you have artificial nails, gel coating, etc. that needs to be removed by a nail salon please have this removed prior to surgery or surgery may need to be canceled/ delayed if the surgeon/ anesthesia feels like they are unable to be safely monitored.   Do not shave  48 hours prior to surgery.    Do not bring valuables to the hospital. [facility] IS NOT RESPONSIBLE   FOR VALUABLES.   Contacts, dentures or bridgework may not be worn into surgery.   Bring small overnight bag day of surgery.   DO NOT BRING YOUR HOME MEDICATIONS TO THE HOSPITAL. PHARMACY WILL DISPENSE MEDICATIONS LISTED ON YOUR MEDICATION LIST TO YOU DURING YOUR ADMISSION IN THE HOSPITAL!   Please read over the following fact sheets you were given: IF YOU HAVE QUESTIONS ABOUT YOUR PRE-OP INSTRUCTIONS PLEASE CALL [phone] [facility]  If you received a COVID test during your pre-op visit  it is requested that you wear a mask when out in public, stay away from anyone that may not be feeling well and notify your surgeon if you develop symptoms. If you test positive for Covid or have been in contact with anyone that has tested positive in the last 10 days please notify you surgeon.  [facility] - Preparing for Surgery Before surgery, you can play an important role.  Because skin is not sterile, your skin needs to be as free of germs as possible.  You can reduce the number of germs on your skin by washing with CHG (chlorahexidine gluconate) soap before  surgery.  CHG is an antiseptic cleaner which kills germs and bonds with the skin to continue killing germs even after washing. Please DO NOT use if you have an allergy to CHG or antibacterial soaps.  If your skin becomes reddened/irritated stop using the CHG and inform your nurse when you arrive at Short Stay. Do not shave (including legs and underarms) for at least 48 hours prior to the first CHG shower.  You may shave your face/neck.  Please follow these instructions carefully:  1.  Shower with CHG Soap the night before surgery and the  morning of surgery.  2.  If you choose to wash your hair, wash your hair first as usual with your normal  shampoo.  3.  After you shampoo, rinse your hair and body thoroughly to remove the shampoo.                             4.  Use CHG as you would any other liquid soap.  You can apply chg directly to the skin and wash.  Gently with a scrungie or clean washcloth.  5.  Apply the CHG Soap to your body ONLY FROM THE NECK DOWN.   Do   not use on face/ open                           Wound or open sores. Avoid contact with eyes, ears mouth and   genitals (private parts).  Wash face,  Genitals (private parts) with your normal soap.             6.  Wash thoroughly, paying special attention to the area where your    surgery  will be performed.  7.  Thoroughly rinse your body with warm water from the neck down.  8.  DO NOT shower/wash with your normal soap after using and rinsing off the CHG Soap.                9.  Pat yourself dry with a clean towel.            10.  Wear clean pajamas.            11.  Place clean sheets on your bed the night of your first shower and do not  sleep with pets. Day of Surgery : Do not apply any lotions/deodorants the morning of surgery.  Please wear clean clothes to the hospital/surgery center.  FAILURE TO FOLLOW THESE INSTRUCTIONS MAY RESULT IN THE CANCELLATION OF YOUR SURGERY  PATIENT  SIGNATURE_________________________________  NURSE SIGNATURE__________________________________  ________________________________________________________________________   WHAT IS A BLOOD TRANSFUSION? Blood Transfusion Information  A transfusion is the replacement of blood or some of its parts. Blood is made up of multiple cells which provide different functions. Red blood cells carry oxygen and are used for blood loss replacement. White blood cells fight against infection. Platelets control bleeding. Plasma helps clot blood. Other blood products are available for specialized needs, such as hemophilia or other clotting disorders. BEFORE THE TRANSFUSION  Who gives blood for transfusions?  Healthy volunteers who are fully evaluated to make sure their blood is safe. This is blood bank blood. Transfusion therapy is the safest it has ever been in the practice of medicine. Before blood is taken from a donor, a complete history is taken to make sure that person has no history of diseases nor engages in risky social behavior (examples are intravenous drug use or sexual activity with multiple partners). The donor's travel history is screened to minimize risk of transmitting infections, such as malaria. The donated blood is tested for signs of infectious diseases, such as HIV and hepatitis. The blood is then tested to be sure it is compatible with you in order to minimize the chance of a transfusion reaction. If you or a relative donates blood, this is often done in anticipation of surgery and is not appropriate for emergency situations. It takes many days to process the donated blood. RISKS AND COMPLICATIONS Although transfusion therapy is very safe and saves many lives, the main dangers of transfusion include:  Getting an infectious disease. Developing a transfusion reaction. This is an allergic reaction to something in the blood you were given. Every precaution is taken to prevent this. The decision to  have a blood transfusion has been considered carefully by your caregiver before blood is given. Blood is not given unless the benefits outweigh the risks. AFTER THE TRANSFUSION Right after receiving a blood transfusion, you will usually feel much better and more energetic. This is especially true if your red blood cells have gotten low (anemic). The transfusion raises the level of the red blood cells which carry oxygen, and this usually causes an energy increase. The nurse administering the transfusion will monitor you carefully for complications. HOME CARE INSTRUCTIONS  No special instructions are needed after a transfusion. You may find your energy is better. Speak with your caregiver about any limitations on activity for underlying diseases  you may have. SEEK MEDICAL CARE IF:  Your condition is not improving after your transfusion. You develop redness or irritation at the intravenous (IV) site. SEEK IMMEDIATE MEDICAL CARE IF:  Any of the following symptoms occur over the next 12 hours: Shaking chills. You have a temperature by mouth above 102 F (38.9 C), not controlled by medicine. Chest, back, or muscle pain. People around you feel you are not acting correctly or are confused. Shortness of breath or difficulty breathing. Dizziness and fainting. You get a rash or develop hives. You have a decrease in urine output. Your urine turns a dark color or changes to pink, red, or brown. Any of the following symptoms occur over the next 10 days: You have a temperature by mouth above 102 F (38.9 C), not controlled by medicine. Shortness of breath. Weakness after normal activity. The white part of the eye turns yellow (jaundice). You have a decrease in the amount of urine or are urinating less often. Your urine turns a dark color or changes to pink, red, or brown. Document Released: 03/31/2000 Document Revised: 06/26/2011 Document Reviewed: 11/18/2007 [facility] Patient Information 2014  [place], [place].  _______________________________________________________________________

## 2022-11-15 NOTE — Progress Notes (Addendum)
COVID Vaccine Completed:  Yes  Date of COVID positive in last 90 days: No  PCP - Sanda Linger, MD Cardiologist - N/A  Chest x-ray - 05-23-22 Epic EKG - 05-23-22 Epic Stress Test -  N/A ECHO -  N/A Cardiac Cath -  N/A Pacemaker/ICD device last checked: Spinal Cord Stimulator: N/A  Bowel Prep -  N/A  Sleep Study -  N/A CPAP -   Fasting Blood Sugar -  N/A Checks Blood Sugar _____ times a day  Last dose of GLP1 agonist-  N/A GLP1 instructions:  N/A   Last dose of SGLT-2 inhibitors-  N/A SGLT-2 instructions: N/A  Blood Thinner Instructions:   N/A Aspirin Instructions: Last Dose:  Activity level:  Can go up a flight of stairs and perform activities of daily living without stopping and without symptoms of chest pain or shortness of breath.  Able to exercise without symptoms  Anesthesia review:  N/A  Patient denies shortness of breath, fever, cough and chest pain at PAT appointment  Patient verbalized understanding of instructions that were given to them at the PAT appointment. Patient was also instructed that they will need to review over the PAT instructions again at home before surgery.

## 2022-11-17 ENCOUNTER — Other Ambulatory Visit: Payer: Self-pay

## 2022-11-17 ENCOUNTER — Encounter (HOSPITAL_COMMUNITY): Payer: Self-pay

## 2022-11-17 ENCOUNTER — Encounter (HOSPITAL_COMMUNITY)
Admission: RE | Admit: 2022-11-17 | Discharge: 2022-11-17 | Disposition: A | Discharge status: Home / Self Care | Payer: 59 | Source: Ambulatory Visit | Attending: General Surgery | Admitting: General Surgery

## 2022-11-17 VITALS — BP 127/93 | HR 110 | Temp 99.0°F | Resp 16 | Ht 61.0 in | Wt 144.4 lb

## 2022-11-17 DIAGNOSIS — Z01812 Encounter for preprocedural laboratory examination: Secondary | ICD-10-CM | POA: Insufficient documentation

## 2022-11-17 DIAGNOSIS — K449 Diaphragmatic hernia without obstruction or gangrene: Secondary | ICD-10-CM | POA: Diagnosis not present

## 2022-11-17 DIAGNOSIS — Z01818 Encounter for other preprocedural examination: Secondary | ICD-10-CM

## 2022-11-17 HISTORY — DX: Gastro-esophageal reflux disease without esophagitis: K21.9

## 2022-11-17 LAB — CBC WITH DIFFERENTIAL/PLATELET
Abs Immature Granulocytes: 0.02 10*3/uL (ref 0.00–0.07)
Basophils Absolute: 0 10*3/uL (ref 0.0–0.1)
Basophils Relative: 0 %
Eosinophils Absolute: 0.1 10*3/uL (ref 0.0–0.5)
Eosinophils Relative: 1 %
HCT: 37.1 % (ref 36.0–46.0)
Hemoglobin: 12.3 g/dL (ref 12.0–15.0)
Immature Granulocytes: 0 %
Lymphocytes Relative: 25 %
Lymphs Abs: 1.8 10*3/uL (ref 0.7–4.0)
MCH: 31.4 pg (ref 26.0–34.0)
MCHC: 33.2 g/dL (ref 30.0–36.0)
MCV: 94.6 fL (ref 80.0–100.0)
Monocytes Absolute: 0.6 10*3/uL (ref 0.1–1.0)
Monocytes Relative: 8 %
Neutro Abs: 4.7 10*3/uL (ref 1.7–7.7)
Neutrophils Relative %: 66 %
Platelets: 379 10*3/uL (ref 150–400)
RBC: 3.92 MIL/uL (ref 3.87–5.11)
RDW: 13.2 % (ref 11.5–15.5)
WBC: 7.3 10*3/uL (ref 4.0–10.5)
nRBC: 0 % (ref 0.0–0.2)

## 2022-11-17 LAB — COMPREHENSIVE METABOLIC PANEL
ALT: 28 U/L (ref 0–44)
AST: 19 U/L (ref 15–41)
Albumin: 4.1 g/dL (ref 3.5–5.0)
Alkaline Phosphatase: 57 U/L (ref 38–126)
Anion gap: 8 (ref 5–15)
BUN: 13 mg/dL (ref 6–20)
CO2: 23 mmol/L (ref 22–32)
Calcium: 8.8 mg/dL — ABNORMAL LOW (ref 8.9–10.3)
Chloride: 108 mmol/L (ref 98–111)
Creatinine, Ser: 0.86 mg/dL (ref 0.44–1.00)
GFR, Estimated: >60 mL/min (ref >60)
Glucose, Bld: 73 mg/dL (ref 70–99)
Potassium: 3.6 mmol/L (ref 3.5–5.1)
Sodium: 139 mmol/L (ref 135–145)
Total Bilirubin: 0.4 mg/dL (ref 0.3–1.2)
Total Protein: 7.5 g/dL (ref 6.5–8.1)

## 2022-11-17 LAB — TYPE AND SCREEN
ABO/RH(D): O POS
Antibody Screen: POSITIVE

## 2022-11-20 NOTE — Anesthesia Preprocedure Evaluation (Signed)
Anesthesia Evaluation  Patient identified by MRN, date of birth, ID band Patient awake    Reviewed: Allergy & Precautions, NPO status , Patient's Chart, lab work & pertinent test results  Airway Mallampati: II       Dental  (+) Teeth Intact, Dental Advisory Given   Pulmonary    breath sounds clear to auscultation       Cardiovascular hypertension,  Rhythm:Regular Rate:Normal     Neuro/Psych  PSYCHIATRIC DISORDERS Anxiety Depression     Neuromuscular disease    GI/Hepatic Neg liver ROS, hiatal hernia,GERD  ,,  Endo/Other  negative endocrine ROS    Renal/GU negative Renal ROS     Musculoskeletal   Abdominal   Peds  Hematology  (+) Blood dyscrasia, anemia   Anesthesia Other Findings   Reproductive/Obstetrics                             Anesthesia Physical Anesthesia Plan  ASA: 2  Anesthesia Plan: General   Post-op Pain Management: Tylenol PO (pre-op)* and Toradol IV (intra-op)*   Induction: Intravenous  PONV Risk Score and Plan: 4 or greater and Ondansetron, Dexamethasone, Midazolam and Scopolamine patch - Pre-op  Airway Management Planned: Oral ETT  Additional Equipment: None  Intra-op Plan:   Post-operative Plan: Extubation in OR  Informed Consent: I have reviewed the patients History and Physical, chart, labs and discussed the procedure including the risks, benefits and alternatives for the proposed anesthesia with the patient or authorized representative who has indicated his/her understanding and acceptance.     Dental advisory given  Plan Discussed with: CRNA  Anesthesia Plan Comments:        Anesthesia Quick Evaluation

## 2022-11-21 ENCOUNTER — Ambulatory Visit (HOSPITAL_BASED_OUTPATIENT_CLINIC_OR_DEPARTMENT_OTHER): Payer: 59 | Admitting: Anesthesiology

## 2022-11-21 ENCOUNTER — Ambulatory Visit (HOSPITAL_COMMUNITY): Payer: Self-pay | Admitting: Anesthesiology

## 2022-11-21 ENCOUNTER — Encounter (HOSPITAL_COMMUNITY)
Admission: RE | Disposition: A | Discharge status: Home / Self Care | Payer: Self-pay | Source: Home / Self Care | Attending: General Surgery

## 2022-11-21 ENCOUNTER — Encounter (HOSPITAL_COMMUNITY): Payer: Self-pay | Admitting: General Surgery

## 2022-11-21 ENCOUNTER — Other Ambulatory Visit: Payer: Self-pay

## 2022-11-21 ENCOUNTER — Observation Stay (HOSPITAL_COMMUNITY)
Admission: RE | Admit: 2022-11-21 | Discharge: 2022-11-22 | Disposition: A | Discharge status: Home / Self Care | Payer: 59 | Attending: General Surgery | Admitting: General Surgery

## 2022-11-21 DIAGNOSIS — Z01818 Encounter for other preprocedural examination: Secondary | ICD-10-CM

## 2022-11-21 DIAGNOSIS — K440 Diaphragmatic hernia with obstruction, without gangrene: Secondary | ICD-10-CM

## 2022-11-21 DIAGNOSIS — K449 Diaphragmatic hernia without obstruction or gangrene: Secondary | ICD-10-CM | POA: Diagnosis present

## 2022-11-21 DIAGNOSIS — I10 Essential (primary) hypertension: Secondary | ICD-10-CM | POA: Insufficient documentation

## 2022-11-21 DIAGNOSIS — Z8719 Personal history of other diseases of the digestive system: Principal | ICD-10-CM

## 2022-11-21 HISTORY — PX: XI ROBOTIC ASSISTED PARAESOPHAGEAL HERNIA REPAIR: SHX6871

## 2022-11-21 HISTORY — PX: UPPER GI ENDOSCOPY: SHX6162

## 2022-11-21 LAB — TYPE AND SCREEN

## 2022-11-21 LAB — POCT PREGNANCY, URINE: Preg Test, Ur: NEGATIVE

## 2022-11-21 SURGERY — REPAIR, HERNIA, PARAESOPHAGEAL, ROBOT-ASSISTED
Anesthesia: General

## 2022-11-21 MED ORDER — KETOROLAC TROMETHAMINE 30 MG/ML IJ SOLN
INTRAMUSCULAR | Status: DC | PRN
  Administered 2022-11-21: 30 mg via INTRAVENOUS

## 2022-11-21 MED ORDER — CHLORHEXIDINE GLUCONATE CLOTH 2 % EX PADS: 6.0000 | MEDICATED_PAD | Freq: Once | CUTANEOUS | Status: DC

## 2022-11-21 MED ORDER — ALBUMIN HUMAN 5 % IV SOLN: INTRAVENOUS | Status: DC | PRN

## 2022-11-21 MED ORDER — SUGAMMADEX SODIUM 200 MG/2ML IV SOLN
INTRAVENOUS | Status: DC | PRN
  Administered 2022-11-21: 150 mg via INTRAVENOUS

## 2022-11-21 MED ORDER — LIDOCAINE 2% (20 MG/ML) 5 ML SYRINGE
INTRAMUSCULAR | Status: DC | PRN
  Administered 2022-11-21: 100 mg via INTRAVENOUS

## 2022-11-21 MED ORDER — PANTOPRAZOLE SODIUM 40 MG IV SOLR
40.0000 mg | Freq: Two times a day (BID) | INTRAVENOUS | Status: DC
  Administered 2022-11-21 – 2022-11-22 (×3): 40 mg via INTRAVENOUS
  Filled 2022-11-21 (×3): qty 10

## 2022-11-21 MED ORDER — KETOROLAC TROMETHAMINE 30 MG/ML IJ SOLN
INTRAMUSCULAR | Status: AC
  Filled 2022-11-21: qty 1

## 2022-11-21 MED ORDER — AMISULPRIDE (ANTIEMETIC) 5 MG/2ML IV SOLN
10.0000 mg | Freq: Once | INTRAVENOUS | Status: AC | PRN
  Administered 2022-11-21: 10 mg via INTRAVENOUS

## 2022-11-21 MED ORDER — ROCURONIUM BROMIDE 10 MG/ML (PF) SYRINGE
PREFILLED_SYRINGE | INTRAVENOUS | Status: AC
  Filled 2022-11-21: qty 10

## 2022-11-21 MED ORDER — KCL IN DEXTROSE-NACL 20-5-0.45 MEQ/L-%-% IV SOLN
INTRAVENOUS | Status: DC
  Filled 2022-11-21 (×3): qty 1000

## 2022-11-21 MED ORDER — SCOPOLAMINE 1 MG/3DAYS TD PT72
1.0000 | MEDICATED_PATCH | TRANSDERMAL | Status: DC
  Administered 2022-11-21: 1.5 mg via TRANSDERMAL
  Filled 2022-11-21: qty 1

## 2022-11-21 MED ORDER — SIMETHICONE 80 MG PO CHEW
40.0000 mg | CHEWABLE_TABLET | Freq: Four times a day (QID) | ORAL | Status: DC | PRN
  Administered 2022-11-21: 40 mg via ORAL
  Filled 2022-11-21: qty 1

## 2022-11-21 MED ORDER — KETAMINE HCL 10 MG/ML IJ SOLN
INTRAMUSCULAR | Status: DC | PRN
  Administered 2022-11-21: 20 mg via INTRAVENOUS
  Administered 2022-11-21 (×3): 10 mg via INTRAVENOUS

## 2022-11-21 MED ORDER — ROCURONIUM BROMIDE 10 MG/ML (PF) SYRINGE
PREFILLED_SYRINGE | INTRAVENOUS | Status: DC | PRN
  Administered 2022-11-21 (×3): 20 mg via INTRAVENOUS
  Administered 2022-11-21: 70 mg via INTRAVENOUS

## 2022-11-21 MED ORDER — LAMOTRIGINE 100 MG PO TABS
200.0000 mg | ORAL_TABLET | Freq: Every day | ORAL | Status: DC
  Administered 2022-11-22: 200 mg via ORAL
  Filled 2022-11-21: qty 2

## 2022-11-21 MED ORDER — HEPARIN SODIUM (PORCINE) 5000 UNIT/ML IJ SOLN
5000.0000 [IU] | Freq: Once | INTRAMUSCULAR | Status: AC
  Administered 2022-11-21: 5000 [IU] via SUBCUTANEOUS
  Filled 2022-11-21: qty 1

## 2022-11-21 MED ORDER — OXYCODONE HCL 5 MG/5ML PO SOLN: 5.0000 mg | Freq: Once | ORAL | Status: DC | PRN

## 2022-11-21 MED ORDER — AMISULPRIDE (ANTIEMETIC) 5 MG/2ML IV SOLN
INTRAVENOUS | Status: AC
  Filled 2022-11-21: qty 4

## 2022-11-21 MED ORDER — PROMETHAZINE HCL 25 MG/ML IJ SOLN
6.2500 mg | INTRAMUSCULAR | Status: AC | PRN
  Administered 2022-11-21 (×2): 6.25 mg via INTRAVENOUS

## 2022-11-21 MED ORDER — METHOCARBAMOL 500 MG PO TABS
500.0000 mg | ORAL_TABLET | Freq: Four times a day (QID) | ORAL | Status: DC | PRN
  Administered 2022-11-21 – 2022-11-22 (×2): 500 mg via ORAL
  Filled 2022-11-21 (×2): qty 1

## 2022-11-21 MED ORDER — ONDANSETRON 4 MG PO TBDP: 4.0000 mg | ORAL_TABLET | Freq: Four times a day (QID) | ORAL | Status: DC | PRN

## 2022-11-21 MED ORDER — ALBUMIN HUMAN 5 % IV SOLN
INTRAVENOUS | Status: AC
  Filled 2022-11-21: qty 250

## 2022-11-21 MED ORDER — DEXAMETHASONE SODIUM PHOSPHATE 10 MG/ML IJ SOLN
INTRAMUSCULAR | Status: AC
  Filled 2022-11-21: qty 1

## 2022-11-21 MED ORDER — ACETAMINOPHEN 500 MG PO TABS
1000.0000 mg | ORAL_TABLET | ORAL | Status: AC
  Administered 2022-11-21: 1000 mg via ORAL

## 2022-11-21 MED ORDER — DIPHENHYDRAMINE HCL 12.5 MG/5ML PO ELIX: 12.5000 mg | ORAL_SOLUTION | Freq: Four times a day (QID) | ORAL | Status: DC | PRN

## 2022-11-21 MED ORDER — SODIUM CHLORIDE 0.9 % IV SOLN
12.5000 mg | Freq: Four times a day (QID) | INTRAVENOUS | Status: DC | PRN
  Administered 2022-11-22: 12.5 mg via INTRAVENOUS
  Filled 2022-11-21: qty 12.5

## 2022-11-21 MED ORDER — MIDAZOLAM HCL 2 MG/2ML IJ SOLN
INTRAMUSCULAR | Status: AC
  Filled 2022-11-21: qty 2

## 2022-11-21 MED ORDER — CEFAZOLIN SODIUM-DEXTROSE 2-4 GM/100ML-% IV SOLN
2.0000 g | INTRAVENOUS | Status: AC
  Administered 2022-11-21: 2 g via INTRAVENOUS
  Filled 2022-11-21: qty 100

## 2022-11-21 MED ORDER — LIDOCAINE HCL (PF) 2 % IJ SOLN
INTRAMUSCULAR | Status: AC
  Filled 2022-11-21: qty 5

## 2022-11-21 MED ORDER — CHLORHEXIDINE GLUCONATE 0.12 % MT SOLN
15.0000 mL | Freq: Once | OROMUCOSAL | Status: AC
  Administered 2022-11-21: 15 mL via OROMUCOSAL

## 2022-11-21 MED ORDER — LACTATED RINGERS IV SOLN: INTRAVENOUS | Status: DC

## 2022-11-21 MED ORDER — OXYCODONE HCL 5 MG PO TABS
5.0000 mg | ORAL_TABLET | ORAL | Status: DC | PRN
  Administered 2022-11-21 – 2022-11-22 (×4): 10 mg via ORAL
  Filled 2022-11-21 (×4): qty 2

## 2022-11-21 MED ORDER — MIDAZOLAM HCL 2 MG/2ML IJ SOLN
INTRAMUSCULAR | Status: DC | PRN
  Administered 2022-11-21: 2 mg via INTRAVENOUS

## 2022-11-21 MED ORDER — PHENYLEPHRINE HCL (PRESSORS) 10 MG/ML IV SOLN
INTRAVENOUS | Status: AC
  Filled 2022-11-21: qty 1

## 2022-11-21 MED ORDER — FENTANYL CITRATE (PF) 250 MCG/5ML IJ SOLN
INTRAMUSCULAR | Status: AC
  Filled 2022-11-21: qty 5

## 2022-11-21 MED ORDER — ORAL CARE MOUTH RINSE: 15.0000 mL | Freq: Once | OROMUCOSAL | Status: AC

## 2022-11-21 MED ORDER — BUPROPION HCL ER (XL) 150 MG PO TB24
450.0000 mg | ORAL_TABLET | Freq: Every day | ORAL | Status: DC
  Administered 2022-11-22: 450 mg via ORAL
  Filled 2022-11-21: qty 3

## 2022-11-21 MED ORDER — DIPHENHYDRAMINE HCL 50 MG/ML IJ SOLN: 12.5000 mg | Freq: Four times a day (QID) | INTRAMUSCULAR | Status: DC | PRN

## 2022-11-21 MED ORDER — FENTANYL CITRATE PF 50 MCG/ML IJ SOSY
PREFILLED_SYRINGE | INTRAMUSCULAR | Status: AC
  Filled 2022-11-21: qty 1

## 2022-11-21 MED ORDER — PROPOFOL 10 MG/ML IV BOLUS
INTRAVENOUS | Status: DC | PRN
Start: 2022-11-21 — End: 2022-11-21
  Administered 2022-11-21: 150 mg via INTRAVENOUS

## 2022-11-21 MED ORDER — ONDANSETRON HCL 4 MG/2ML IJ SOLN
INTRAMUSCULAR | Status: AC
  Filled 2022-11-21: qty 2

## 2022-11-21 MED ORDER — SCOPOLAMINE 1 MG/3DAYS TD PT72: 1.0000 | MEDICATED_PATCH | TRANSDERMAL | Status: DC

## 2022-11-21 MED ORDER — BUPIVACAINE-EPINEPHRINE 0.25% -1:200000 IJ SOLN
INTRAMUSCULAR | Status: AC
  Filled 2022-11-21: qty 1

## 2022-11-21 MED ORDER — DEXAMETHASONE SODIUM PHOSPHATE 4 MG/ML IJ SOLN: 4.0000 mg | INTRAMUSCULAR | Status: DC

## 2022-11-21 MED ORDER — PHENYLEPHRINE 80 MCG/ML (10ML) SYRINGE FOR IV PUSH (FOR BLOOD PRESSURE SUPPORT)
PREFILLED_SYRINGE | INTRAVENOUS | Status: AC
  Filled 2022-11-21: qty 10

## 2022-11-21 MED ORDER — LACTATED RINGERS IV SOLN
INTRAVENOUS | Status: DC | PRN
  Administered 2022-11-21: 1000 mL

## 2022-11-21 MED ORDER — 0.9 % SODIUM CHLORIDE (POUR BTL) OPTIME
TOPICAL | Status: DC | PRN
Start: 2022-11-21 — End: 2022-11-21
  Administered 2022-11-21: 1000 mL

## 2022-11-21 MED ORDER — ACETAMINOPHEN 10 MG/ML IV SOLN
1000.0000 mg | Freq: Once | INTRAVENOUS | Status: DC | PRN
  Administered 2022-11-21: 1000 mg via INTRAVENOUS
  Filled 2022-11-21: qty 100

## 2022-11-21 MED ORDER — DEXMEDETOMIDINE HCL IN NACL 80 MCG/20ML IV SOLN
INTRAVENOUS | Status: AC
  Filled 2022-11-21: qty 20

## 2022-11-21 MED ORDER — MORPHINE SULFATE (PF) 2 MG/ML IV SOLN
1.0000 mg | INTRAVENOUS | Status: DC | PRN
  Administered 2022-11-21 – 2022-11-22 (×4): 2 mg via INTRAVENOUS
  Filled 2022-11-21 (×4): qty 1

## 2022-11-21 MED ORDER — KETAMINE HCL 50 MG/5ML IJ SOSY
PREFILLED_SYRINGE | INTRAMUSCULAR | Status: AC
  Filled 2022-11-21: qty 5

## 2022-11-21 MED ORDER — ONDANSETRON HCL 4 MG/2ML IJ SOLN
INTRAMUSCULAR | Status: DC | PRN
  Administered 2022-11-21: 4 mg via INTRAVENOUS

## 2022-11-21 MED ORDER — BUPROPION HCL ER (XL) 300 MG PO TB24: 300.0000 mg | ORAL_TABLET | Freq: Every day | ORAL | Status: DC

## 2022-11-21 MED ORDER — FENTANYL CITRATE (PF) 100 MCG/2ML IJ SOLN
INTRAMUSCULAR | Status: AC
  Filled 2022-11-21: qty 2

## 2022-11-21 MED ORDER — LIDOCAINE HCL 2 % IJ SOLN
INTRAMUSCULAR | Status: AC
  Filled 2022-11-21: qty 20

## 2022-11-21 MED ORDER — ENOXAPARIN SODIUM 40 MG/0.4ML IJ SOSY
40.0000 mg | PREFILLED_SYRINGE | INTRAMUSCULAR | Status: DC
  Administered 2022-11-22: 40 mg via SUBCUTANEOUS
  Filled 2022-11-21: qty 0.4

## 2022-11-21 MED ORDER — DEXAMETHASONE SODIUM PHOSPHATE 10 MG/ML IJ SOLN
INTRAMUSCULAR | Status: DC | PRN
  Administered 2022-11-21: 10 mg via INTRAVENOUS

## 2022-11-21 MED ORDER — ACETAMINOPHEN 500 MG PO TABS
1000.0000 mg | ORAL_TABLET | Freq: Four times a day (QID) | ORAL | Status: DC
  Administered 2022-11-21 – 2022-11-22 (×2): 1000 mg via ORAL
  Filled 2022-11-21 (×2): qty 2

## 2022-11-21 MED ORDER — FENTANYL CITRATE PF 50 MCG/ML IJ SOSY
25.0000 ug | PREFILLED_SYRINGE | INTRAMUSCULAR | Status: DC | PRN
  Administered 2022-11-21 (×2): 50 ug via INTRAVENOUS

## 2022-11-21 MED ORDER — FENTANYL CITRATE (PF) 250 MCG/5ML IJ SOLN
INTRAMUSCULAR | Status: DC | PRN
  Administered 2022-11-21 (×7): 50 ug via INTRAVENOUS

## 2022-11-21 MED ORDER — ACETAMINOPHEN 500 MG PO TABS
1000.0000 mg | ORAL_TABLET | Freq: Once | ORAL | Status: AC
  Filled 2022-11-21: qty 2

## 2022-11-21 MED ORDER — BUPIVACAINE-EPINEPHRINE 0.25% -1:200000 IJ SOLN
INTRAMUSCULAR | Status: DC | PRN
  Administered 2022-11-21: 30 mL

## 2022-11-21 MED ORDER — ONDANSETRON HCL 4 MG/2ML IJ SOLN
4.0000 mg | Freq: Four times a day (QID) | INTRAMUSCULAR | Status: DC | PRN
  Administered 2022-11-21 – 2022-11-22 (×2): 4 mg via INTRAVENOUS
  Filled 2022-11-21 (×2): qty 2

## 2022-11-21 MED ORDER — PROPOFOL 10 MG/ML IV BOLUS
INTRAVENOUS | Status: AC
  Filled 2022-11-21: qty 20

## 2022-11-21 MED ORDER — PHENYLEPHRINE 80 MCG/ML (10ML) SYRINGE FOR IV PUSH (FOR BLOOD PRESSURE SUPPORT)
PREFILLED_SYRINGE | INTRAVENOUS | Status: DC | PRN
  Administered 2022-11-21: 160 ug via INTRAVENOUS
  Administered 2022-11-21: 80 ug via INTRAVENOUS
  Administered 2022-11-21: 160 ug via INTRAVENOUS

## 2022-11-21 MED ORDER — BUPIVACAINE LIPOSOME 1.3 % IJ SUSP
INTRAMUSCULAR | Status: AC
  Filled 2022-11-21: qty 20

## 2022-11-21 MED ORDER — LACTATED RINGERS IV SOLN
INTRAVENOUS | Status: DC | PRN
Start: 2022-11-21 — End: 2022-11-21

## 2022-11-21 MED ORDER — OXYCODONE HCL 5 MG PO TABS: 5.0000 mg | ORAL_TABLET | Freq: Once | ORAL | Status: DC | PRN

## 2022-11-21 MED ORDER — PROMETHAZINE HCL 25 MG/ML IJ SOLN
INTRAMUSCULAR | Status: AC
  Filled 2022-11-21: qty 1

## 2022-11-21 MED ORDER — MELATONIN 3 MG PO TABS
3.0000 mg | ORAL_TABLET | Freq: Every evening | ORAL | Status: DC | PRN
  Administered 2022-11-21: 3 mg via ORAL
  Filled 2022-11-21: qty 1

## 2022-11-21 MED ORDER — ACETAMINOPHEN 325 MG PO TABS: 325.0000 mg | ORAL_TABLET | ORAL | Status: DC | PRN

## 2022-11-21 MED ORDER — BUPIVACAINE LIPOSOME 1.3 % IJ SUSP
INTRAMUSCULAR | Status: DC | PRN
  Administered 2022-11-21: 20 mL

## 2022-11-21 MED ORDER — ACETAMINOPHEN 160 MG/5ML PO SOLN: 325.0000 mg | ORAL | Status: DC | PRN

## 2022-11-21 MED ORDER — DEXMEDETOMIDINE HCL IN NACL 80 MCG/20ML IV SOLN
INTRAVENOUS | Status: DC | PRN
  Administered 2022-11-21: 8 ug via INTRAVENOUS

## 2022-11-21 SURGICAL SUPPLY — 71 items
ADH SKN CLS APL DERMABOND .7 (GAUZE/BANDAGES/DRESSINGS) ×1
ANTIFOG SOL W/FOAM PAD STRL (MISCELLANEOUS) ×1
APL PRP STRL LF DISP 70% ISPRP (MISCELLANEOUS) ×1
APPLIER CLIP 5 13 M/L LIGAMAX5 (MISCELLANEOUS)
APPLIER CLIP ROT 10 11.4 M/L (STAPLE)
APR CLP MED LRG 11.4X10 (STAPLE)
APR CLP MED LRG 5 ANG JAW (MISCELLANEOUS)
BLADE SURG SZ11 CARB STEEL (BLADE) ×1 IMPLANT
CHLORAPREP W/TINT 26 (MISCELLANEOUS) ×1 IMPLANT
CLIP APPLIE 5 13 M/L LIGAMAX5 (MISCELLANEOUS) IMPLANT
CLIP APPLIE ROT 10 11.4 M/L (STAPLE) IMPLANT
COVER SURGICAL LIGHT HANDLE (MISCELLANEOUS) ×1 IMPLANT
COVER TIP SHEARS 8 DVNC (MISCELLANEOUS) IMPLANT
DERMABOND ADVANCED .7 DNX12 (GAUZE/BANDAGES/DRESSINGS) IMPLANT
DRAIN PENROSE 0.5X18 (DRAIN) IMPLANT
DRAPE ARM DVNC X/XI (DISPOSABLE) ×4 IMPLANT
DRAPE COLUMN DVNC XI (DISPOSABLE) ×1 IMPLANT
DRIVER NDL LRG 8 DVNC XI (INSTRUMENTS) ×1 IMPLANT
DRIVER NDL MEGA SUTCUT DVNCXI (INSTRUMENTS) ×1 IMPLANT
DRIVER NDLE LRG 8 DVNC XI (INSTRUMENTS) ×1 IMPLANT
DRIVER NDLE MEGA SUTCUT DVNCXI (INSTRUMENTS) ×1 IMPLANT
DRSG TEGADERM 2-3/8X2-3/4 SM (GAUZE/BANDAGES/DRESSINGS) ×6 IMPLANT
ELECT REM PT RETURN 15FT ADLT (MISCELLANEOUS) ×1 IMPLANT
GAUZE 4X4 16PLY ~~LOC~~+RFID DBL (SPONGE) ×1 IMPLANT
GAUZE SPONGE 2X2 8PLY STRL LF (GAUZE/BANDAGES/DRESSINGS) ×1 IMPLANT
GLOVE BIO SURGEON STRL SZ7.5 (GLOVE) ×2 IMPLANT
GLOVE INDICATOR 8.0 STRL GRN (GLOVE) ×2 IMPLANT
GOWN STRL REUS W/ TWL XL LVL3 (GOWN DISPOSABLE) IMPLANT
GOWN STRL REUS W/TWL XL LVL3 (GOWN DISPOSABLE)
GRASPER SUT TROCAR 14GX15 (MISCELLANEOUS) IMPLANT
GRASPER TIP-UP FEN DVNC XI (INSTRUMENTS) ×1 IMPLANT
IRRIG SUCT STRYKERFLOW 2 WTIP (MISCELLANEOUS) ×1
IRRIGATION SUCT STRKRFLW 2 WTP (MISCELLANEOUS) ×1 IMPLANT
KIT BASIN OR (CUSTOM PROCEDURE TRAY) ×1 IMPLANT
KIT GASTRIC LAVAGE 34FR ADT (SET/KITS/TRAYS/PACK) IMPLANT
KIT TURNOVER KIT A (KITS) IMPLANT
LUBRICANT JELLY K Y 4OZ (MISCELLANEOUS) IMPLANT
MARKER SKIN DUAL TIP RULER LAB (MISCELLANEOUS) IMPLANT
MESH BIO-A 7X10 SYN MAT (Mesh General) IMPLANT
NDL HYPO 22X1.5 SAFETY MO (MISCELLANEOUS) ×1 IMPLANT
NEEDLE HYPO 22X1.5 SAFETY MO (MISCELLANEOUS) ×1 IMPLANT
PACK CARDIOVASCULAR III (CUSTOM PROCEDURE TRAY) ×1 IMPLANT
PAD POSITIONING PINK XL (MISCELLANEOUS) ×1 IMPLANT
SCISSORS LAP 5X35 DISP (ENDOMECHANICALS) IMPLANT
SCISSORS MNPLR CVD DVNC XI (INSTRUMENTS) ×1 IMPLANT
SEAL UNIV 5-12 XI (MISCELLANEOUS) ×3 IMPLANT
SEALER VESSEL EXT DVNC XI (MISCELLANEOUS) ×1 IMPLANT
SOL ELECTROSURG ANTI STICK (MISCELLANEOUS) ×1
SOLUTION ANTFG W/FOAM PAD STRL (MISCELLANEOUS) ×1 IMPLANT
SOLUTION ELECTROSURG ANTI STCK (MISCELLANEOUS) ×1 IMPLANT
SPIKE FLUID TRANSFER (MISCELLANEOUS) ×1 IMPLANT
STRIP CLOSURE SKIN 1/2X4 (GAUZE/BANDAGES/DRESSINGS) ×1 IMPLANT
SUT ETHIBOND 0 36 GRN (SUTURE) ×3 IMPLANT
SUT MNCRL AB 4-0 PS2 18 (SUTURE) ×1 IMPLANT
SUT SILK 0 SH 30 (SUTURE) IMPLANT
SUT SILK 2 0 SH (SUTURE) IMPLANT
SUT VIC AB 0 UR5 27 (SUTURE) IMPLANT
SUT VICRYL 0 TIES 12 18 (SUTURE) IMPLANT
SYR 20ML ECCENTRIC (SYRINGE) ×1 IMPLANT
SYR 20ML LL LF (SYRINGE) ×1 IMPLANT
TAPE STRIPS DRAPE STRL (GAUZE/BANDAGES/DRESSINGS) IMPLANT
TIP INNERVISION DETACH 40FR (MISCELLANEOUS) IMPLANT
TIP INNERVISION DETACH 50FR (MISCELLANEOUS) IMPLANT
TIP INNERVISION DETACH 56FR (MISCELLANEOUS) IMPLANT
TOWEL OR 17X26 10 PK STRL BLUE (TOWEL DISPOSABLE) ×1 IMPLANT
TRAY FOLEY MTR SLVR 16FR STAT (SET/KITS/TRAYS/PACK) IMPLANT
TROCAR ADV FIXATION 12X100MM (TROCAR) IMPLANT
TROCAR ADV FIXATION 5X100MM (TROCAR) IMPLANT
TROCAR XCEL NON-BLD 5MMX100MML (ENDOMECHANICALS) ×1 IMPLANT
TROCAR Z THREAD OPTICAL 12X100 (TROCAR) IMPLANT
TUBING INSUFFLATION 10FT LAP (TUBING) ×1 IMPLANT

## 2022-11-21 NOTE — Transfer of Care (Signed)
Immediate Anesthesia Transfer of Care Note  Patient: Joan Simpson  Procedure(s) Performed: ROBOTIC REPAIR RECURRENT PARAESOPHAGEAL HIATAL HERNIA WITH MESH UPPER GI ENDOSCOPY  Patient Location: PACU  Anesthesia Type:General  Level of Consciousness: drowsy  Airway & Oxygen Therapy: Patient Spontanous Breathing and Patient connected to face mask oxygen  Post-op Assessment: Report given to RN and Post -op Vital signs reviewed and stable  Post vital signs: Reviewed and stable  Last Vitals:  Vitals Value Taken Time  BP 120/82 11/21/22 1127  Temp    Pulse 106 11/21/22 1129  Resp 15 11/21/22 1129  SpO2 98 % 11/21/22 1129  Vitals shown include unfiled device data.  Last Pain:  Vitals:   11/21/22 0552  TempSrc: Oral  PainSc:       Patients Stated Pain Goal: 3 (11/21/22 0548)  Complications: No notable events documented.

## 2022-11-21 NOTE — Anesthesia Postprocedure Evaluation (Signed)
Anesthesia Post Note  Patient: Ezmerelda Macchione  Procedure(s) Performed: ROBOTIC REPAIR RECURRENT PARAESOPHAGEAL HIATAL HERNIA WITH MESH UPPER GI ENDOSCOPY     Patient location during evaluation: PACU Anesthesia Type: General Level of consciousness: awake and alert Pain management: pain level controlled Vital Signs Assessment: post-procedure vital signs reviewed and stable Respiratory status: spontaneous breathing, nonlabored ventilation, respiratory function stable and patient connected to nasal cannula oxygen Cardiovascular status: blood pressure returned to baseline and stable Postop Assessment: no apparent nausea or vomiting Anesthetic complications: no  No notable events documented.  Last Vitals:  Vitals:   11/21/22 1430 11/21/22 1521  BP: 109/78 114/79  Pulse: 99 97  Resp: 15 16  Temp: 36.6 C 36.7 C  SpO2: 98% 100%    Last Pain:  Vitals:   11/21/22 1430  TempSrc:   PainSc: 0-No pain                 Shelton Silvas

## 2022-11-21 NOTE — Anesthesia Procedure Notes (Signed)
Procedure Name: Intubation Date/Time: 11/21/2022 7:46 AM  Performed by: Florene Route, CRNAPre-anesthesia Checklist: Patient identified, Emergency Drugs available, Suction available and Patient being monitored Patient Re-evaluated:Patient Re-evaluated prior to induction Oxygen Delivery Method: Circle system utilized Preoxygenation: Pre-oxygenation with 100% oxygen Induction Type: IV induction Ventilation: Mask ventilation without difficulty Laryngoscope Size: Miller and 2 Grade View: Grade I Tube type: Oral Tube size: 7.0 mm Number of attempts: 1 Airway Equipment and Method: Stylet Placement Confirmation: ETT inserted through vocal cords under direct vision, positive ETCO2 and breath sounds checked- equal and bilateral Secured at: 21 cm Tube secured with: Tape Dental Injury: Teeth and Oropharynx as per pre-operative assessment

## 2022-11-21 NOTE — H&P (Signed)
CC: here for surgery  Requesting provider: n/a  HPI: Joan Simpson is an 32 y.o. female who is here for redo paraesophageal hernia surgery.  No major changes since seen in clinic.   Now trouble with even with liquids - chest pressure, burping.   Old hpi: Joan Simpson is a 32 y.o. female who is seen today for recurrent hiatal hernia. She underwent robotic hiatal hernia repair with Nissen fundoplication on December 12, 2021. She was doing excellent after surgery. She ended up in the emergency room on February 6 and was readmitted and discharged on the eighth. She had had a stomach virus followed by COVID with lots of coughing and then she developed inability or trouble tolerating solid foods as well as chest pain which prompted her to come to the emergency room. She was placed on multimodal antiemetic treatment because she was having a lot of retching, a short course of steroids, twice a day PPI as well as Carafate. CT showed a recurrent hiatal hernia with what appears to be a wrap migrated into the mediastinum   Last saw her in February 2024. She was tolerating full liquids with an occasional stuck sensation and occasional chest discomfort. The plan was to wait a minimum of 6 months from her recurrence before reoperating. She is also undergone repeat upper endoscopy  SHe states that she initially did well after surgery her issues after the first surgery were nausea, flatulence, and burping but her heartburn was well-controlled and she has considered surgery successful  She states that since I last saw her she has ongoing daily nausea. It is random. It generally occurs about 3 times a day. It is controlled with the nausea medicine. She notices that since she has had the endoscopy she has had more pressure with eating solid foods and more discomfort with eating solid foods. She is eating small amounts of solids. No heartburn. She reports more fatigue. She still has ongoing flatus and some  burping. No diarrhea or constipation. No melena or hematochezia. She is taking the Carafate and different reflux medicine that gastroenterology gave her after her upper endoscopy. SHe does not have dysphagia to liquids. No issues tolerating liquids. She did have 1 episode of retching with white foamy's   Past Medical History:  Diagnosis Date   Allergy    Anemia    Anxiety    Bipolar 1 disorder (HCC)    Depression    GERD (gastroesophageal reflux disease)    Hypertension    Nexplanon in place 12/09/2021   left upper arm, use caution when using left upper arm, no B/P cuff to that area please   Pre-diabetes     Past Surgical History:  Procedure Laterality Date   NO PAST SURGERIES     UPPER GI ENDOSCOPY N/A 12/12/2021   Procedure: UPPER GI ENDOSCOPY;  Surgeon: [name], MD;  Location: [facility];  Service: General;  Laterality: N/A;   XI ROBOTIC ASSISTED HIATAL HERNIA REPAIR N/A 12/12/2021   Procedure: XI ROBOTIC ASSISTED HIATAL HERNIA  REPAIR WITH FUNDOPLICATION;  Surgeon: [name], MD;  Location: [facility];  Service: General;  Laterality: N/A;    Family History  Problem Relation Age of Onset   Diabetes Mother    Hypertension Mother    Heart disease Mother    Other Father        prediabetes   Hypertension Father    Diabetes Brother    Heart disease Brother    Diabetes Paternal Grandmother  Colon cancer Neg Hx    Esophageal cancer Neg Hx    Inflammatory bowel disease Neg Hx    Liver disease Neg Hx    Pancreatic cancer Neg Hx    Rectal cancer Neg Hx    Stomach cancer Neg Hx     Social:  reports that she has never smoked. She has never been exposed to tobacco smoke. She has never used smokeless tobacco. She reports that she does not currently use alcohol. She reports that she does not currently use drugs.  Allergies:  Allergies  Allergen Reactions   Erythromycin Anaphylaxis and Swelling    Says she throws up and can't breath     Medications: I have reviewed the  patient's current medications.   ROS - all of the below systems have been reviewed with the patient and positives are indicated with bold text General: chills, fever or night sweats Eyes: blurry vision or double vision ENT: epistaxis or sore throat Allergy/Immunology: itchy/watery eyes or nasal congestion Hematologic/Lymphatic: bleeding problems, blood clots or swollen lymph nodes Endocrine: temperature intolerance or unexpected weight changes Breast: new or changing breast lumps or nipple discharge Resp: cough, shortness of breath, or wheezing CV: chest pain or dyspnea on exertion GI: as per HPI GU: dysuria, trouble voiding, or hematuria MSK: joint pain or joint stiffness Neuro: TIA or stroke symptoms Derm: pruritus and skin lesion changes Psych: anxiety and depression  PE Blood pressure 99/78, pulse 86, temperature 98 F (36.7 C), temperature source Oral, resp. rate 16, height 5\' 1"  (1.549 m), weight 65.5 kg, last menstrual period 11/14/2022, SpO2 98%. Constitutional: NAD; conversant; no deformities Eyes: Moist conjunctiva; no lid lag; anicteric; PERRL Neck: Trachea midline; no thyromegaly Lungs: Normal respiratory effort; no tactile fremitus CV: RRR; no palpable thrills; no pitting edema GI: Abd soft, nt, nd; no palpable hepatosplenomegaly MSK: Normal gait; no clubbing/cyanosis Psychiatric: Appropriate affect; alert and oriented x3 Lymphatic: No palpable cervical or axillary lymphadenopathy Skin:no rash/lesions  Results for orders placed or performed during the hospital encounter of 11/21/22 (from the past 48 hour(s))  Pregnancy, urine POC     Status: None   Collection Time: 11/21/22  5:46 AM  Result Value Ref Range   Preg Test, Ur NEGATIVE NEGATIVE    Comment:        THE SENSITIVITY OF THIS METHODOLOGY IS >24 mIU/mL     No results found.  Imaging:   A/P: Joan Simpson is an 32 y.o. female with  Paraesophageal hiatal hernia - recurrent  History of repair  of hiatal hernia  S/P Nissen fundoplication (without gastrostomy tube) procedure  Slipped Nissen fundoplication  History of iron deficiency anemia  Chronic nausea  Eras  IV abx Subcu heparin All questions asked and answered To or for repair, possible mesh, possible wrap  Mary Sella. Andrey Campanile, MD, FACS General, Bariatric, & Minimally Invasive Surgery [facility]

## 2022-11-21 NOTE — Op Note (Signed)
11/21/2022  11:39 AM  PATIENT:  Joan Simpson  32 y.o. female  PRE-OPERATIVE DIAGNOSIS:  RECURRENT PARAESOPHAGEAL HERNIA  POST-OPERATIVE DIAGNOSIS:  RECURRENT PARAESOPHAGEAL HERNIA  PROCEDURE:  Procedure(s): ROBOTIC REPAIR RECURRENT PARAESOPHAGEAL HIATAL HERNIA WITH MESH UPPER GI ENDOSCOPY Laparoscopic bilateral tap block  SURGEON:  Surgeon(s): [name], MD   ASSISTANTS: Berna Bue, MD   ANESTHESIA:   general  DRAINS: none   LOCAL MEDICATIONS USED:  MARCAINE    and OTHER exparel  SPECIMEN:  No Specimen  DISPOSITION OF SPECIMEN:  N/A  COUNTS:  YES  Findings: Patient had a fairly large recurrent paraesophageal hiatal hernia.  Her wrap had migrated into her mediastinum.  Her wrap was completely intact therefore we did not have to take it down or redo it.  The right crura was a little bit attenuated so therefore I decided to reinforce the crural closure with a piece of Gore bio a hiatal hernia mesh  INDICATION FOR PROCEDURE: 32 year old female who underwent robotic repair of moderate type III hiatal hernia with Nissen fundoplication on December 12, 2021.  She did very well after surgery.  She had resolution of her reflux and GERD.  She did have some intermittent nausea sensation.  She was doing well until early winter 2024.  She had contracted COVID and had a significant issue with coughing.  She then developed trouble tolerating solid food.  She came to the emergency room and was found to have a recurrent hiatal hernia now with paraesophageal components.  We were able to control her symptoms and discharge her.  We recommended waiting for repair in order to help potentially encounter less scar tissue.  She underwent upper GI, upper endoscopy and CT imaging prior to coming to surgery today.  We had had multiple conversations about potential intraoperative findings as well as potential intraoperative complications as well as expectations after recurrent paraesophageal hiatal  hernia surgery.  We discussed that her nausea may not be ameliorated.  PROCEDURE: Patient was given 5000 units of subcutaneous heparin preoperatively.  She received oral medications for enhanced recovery.  She also received IV antibiotic.  She was taken to the OR to [facility] and placed supine on the operating room table.  General endotracheal anesthesia was established.  A Foley catheter was placed.  Her arms were tucked at her side with the appropriate padding.  Her abdomen was prepped and draped in the usual standard surgical fashion with ChloraPrep.  Surgical timeout was performed.  I gained access to the abdomen using the Optiview technique.  I went through an old trocar site about 8 cm to the left of the umbilicus.  Using a 0 degree 5 mm laparoscope through a 5 mm trocar I advanced the trocar through all layers of the abdominal wall and carefully entered the abdominal cavity.  There is no evidence of injury to surrounding structures or pneumoperitoneum was established.  The laparoscope was advanced and abdominal cavity was surveilled.  There is no scar tissue within the abdominal cavity.  A right-sided tap block was performed for postoperative pain relief.  We then went about placing additional 8 mm robotic trocars 1 slightly above into the left of the umbilicus, 1 in the right mid abdomen, 1 in the left lateral abdominal wall and and ultimately replacing the Optiview trocar with a final 8 mm robotic trocar.  We were able to use most of her old visions.  A 12 mm assistant port was placed in the right upper quadrant.  A left-sided tap block was performed for postoperative pain relief.  A Nathanson liver retractor was placed through a small subxiphoid incision to lift of the left lobe of the liver.  There was a little bit of scar tissue from the undersurface of the left lobe to the right crus but not significant dense adhesions.  The [employer] robot was then docked in typical  fashion.  Initially started with a forced bipolar in arm 1, camera in arm 2, vessel sealer in arm 3, and tip up grasper in arm 4.  I ended up switching to a Cadiere in arm 1 fairly soon after sitting down with the surgeon console.  We first started our attention on the right side looking at the right crus.  We mobilized the posterior edge of the right lobe of the liver from the right crus initially with vessel sealer and then switched to robotic scissors with and without cautery and we were able to free the hepatic lobe from the right crus.  My assistant helped provide traction.  The patient's wrap was herniated into the chest.  We identified a plane between the right crura with the peritoneum intact and the posterior portion of the wrap.  We started mobilizing and getting into this plane with scissors sharply with and without energy at times.  Identified the junction of the left and right crura.  The previously placed Ethibond sutures and the crura had pulled apart.  We identified the aorta.  We then started coming up anteriorly along the right side but this is where the scar tissue was a little bit dense and it was hard to delineate scar tissue versus potential esophagus.  So therefore we switched to the left side and focused on the left crura.  The patient had herniated omentum into the mediastinum.  We reduced the herniated omentum which had adhered to the hernia space in the mediastinum with the vessel sealer.  We were able to create a retrogastric window behind the herniated pouch and we ended up passing a Penrose drain around this.  This helped facilitate further dissection in the mediastinum.  We continued posteriorly creating a plane between the esophagus and the aorta.  This was done with blunt dissection with the vessel sealer taking down tissue at times with the vessel sealer.  We then came up along the right anterior lateral side of the esophagus.  The right vagus nerve was identified and preserved.   The right pleura was densely adhered in this location.  We ended up getting into the right pleural space.  There is a brief change in peak airway pressures but then they normalized.  I continued a circumferential mobilization of the esophagus using blunt dissection and vessel sealer where needed.  We were able to mobilize the esophagus anteriorly with scissors with gentle spreading and identified the esophageal wall and separated from scar tissue.  There was still some omentum adhered on the left anterior lateral side which was taken down with vessel sealer.  This left 1 segment of the esophagus in the mediastinum that was a little bit adhered at the 1 o'clock position.  It was hard to initially determine if this was potentially esophageal wall versus scar tissue.  I left the surgeon console and obtained the Olympus endoscope and placed in the patient's oropharynx and guided down.  The upper and midesophagus was normal.  The lower esophagus also appeared normal.  There was no evidence of any full-thickness injury or mucosal disruption.  The area that was tethered in the mediastinum of the esophagus we visualized that both endoscopically and with the robotic camera transilluminated at times with the endoscope.  This appeared to just be normal scar tissue and not part of the esophagus when viewed with the robotic camera along with endoscopic visualization.  I then advanced the endoscope into the stomach.  Insufflated the stomach and retroflexed.  The patient's wrap was intact.  Scope was detorsed and the stomach was desufflated.  We visualized the Z-line above the level of the wrap.  Endoscope was removed and a return to the surgeon console.  I went back up into the mediastinum and took down this area with scissors without energy device this gave [place] some additional intra-abdominal esophageal length.  At this point we had achieved about 3 cm of intra-abdominal esophageal length.  The stomach and Nissen fundoplication  and the 3 cm of esophageal length were resting in the abdominal cavity without any tension.  I then reapproximated the left and right crura with 4 interrupted 0 Ethibond sutures.  This left about a 2-1/2 cm opening at the hiatus for the esophagus.  There is no evidence of kinking of the esophagus.  Because this was a recurrence and the right crus was a little bit attenuated I decided to reinforce the cruroplasty with Gore Bio-A hiatal hernia mesh.  My assistant placed it through the 12 m trocar.  We passed the mesh around the stomach and the esophagus.  The opening was left anteriorly.  The mesh was then anchored to the cruroplasty with a 0 Ethibond suture.  The right apex of the mesh was then sutured to the right crura anteriorly with another Ethibond suture.  And the left apex of the mesh was then secured to the left diaphragm with another 0 Ethibond.  The mesh laid flat.  There was no redundancy.  It did not appear kinked.  The Nissen fundoplication again was reinspected.  It was intact.  It was approximately 2 cm in length.  It appeared floppy.  I was able to place 1 instrument underneath the wrap.  At this point I was satisfied with how the procedure went.  There is no evidence of bleeding.  Remaining needles were removed from the abdomen.  The [facility] liver retractor was removed.  The 12 mm assistant trocar site was closed with a single interrupted 0 Vicryl with a PMI suture passer.  Skin incisions were closed with a 4-0 Monocryl in a subcuticular fashion followed by position or Dermabond.  All needle, instrument, and sponge counts were correct x 2.  There were no immediate complications.  The patient was extubated and taken to the recovery room in stable condition.  The Foley catheter was removed before the patient was extubated.  While we identified the right vagus nerve which was preserved we did not identify or see the left vagus nerve  PLAN OF CARE: Admit for overnight observation  PATIENT  DISPOSITION:  PACU - hemodynamically stable.   Delay start of Pharmacological VTE agent (>24hrs) due to surgical blood loss or risk of bleeding:  no  Mary Sella. Andrey Campanile, MD, FACS General, Bariatric, & Minimally Invasive Surgery [facility], [place]

## 2022-11-22 ENCOUNTER — Other Ambulatory Visit: Payer: Self-pay

## 2022-11-22 ENCOUNTER — Observation Stay (HOSPITAL_COMMUNITY): Payer: 59

## 2022-11-22 ENCOUNTER — Encounter (HOSPITAL_COMMUNITY): Payer: Self-pay | Admitting: General Surgery

## 2022-11-22 DIAGNOSIS — K449 Diaphragmatic hernia without obstruction or gangrene: Secondary | ICD-10-CM | POA: Diagnosis not present

## 2022-11-22 MED ORDER — SUCRALFATE 1 G PO TABS: 1.0000 g | ORAL_TABLET | Freq: Two times a day (BID) | ORAL | 0 refills | Status: AC | PRN

## 2022-11-22 MED ORDER — KETOROLAC TROMETHAMINE 30 MG/ML IJ SOLN
30.0000 mg | Freq: Three times a day (TID) | INTRAMUSCULAR | Status: DC
  Administered 2022-11-22 (×2): 30 mg via INTRAVENOUS
  Filled 2022-11-22 (×2): qty 1

## 2022-11-22 MED ORDER — OXYCODONE HCL 5 MG PO TABS: 5.0000 mg | ORAL_TABLET | Freq: Four times a day (QID) | ORAL | 0 refills | Status: DC | PRN

## 2022-11-22 MED ORDER — ACETAMINOPHEN 500 MG PO TABS: 1000.0000 mg | ORAL_TABLET | Freq: Three times a day (TID) | ORAL | Status: AC

## 2022-11-22 MED ORDER — POTASSIUM CHLORIDE 10 MEQ/100ML IV SOLN
10.0000 meq | INTRAVENOUS | Status: AC
  Administered 2022-11-22 (×2): 10 meq via INTRAVENOUS
  Filled 2022-11-22: qty 100

## 2022-11-22 MED ORDER — PROMETHAZINE HCL 12.5 MG PO TABS: 12.5000 mg | ORAL_TABLET | Freq: Three times a day (TID) | ORAL | 0 refills | Status: AC | PRN

## 2022-11-22 MED ORDER — IOHEXOL 300 MG/ML  SOLN
50.0000 mL | Freq: Once | INTRAMUSCULAR | Status: AC | PRN
  Administered 2022-11-22: 25 mL via ORAL

## 2022-11-22 MED ORDER — ONDANSETRON 8 MG PO TBDP: 8.0000 mg | ORAL_TABLET | Freq: Three times a day (TID) | ORAL | 3 refills | Status: DC | PRN

## 2022-11-22 MED ORDER — METHOCARBAMOL 750 MG PO TABS: 750.0000 mg | ORAL_TABLET | Freq: Three times a day (TID) | ORAL | 0 refills | Status: DC | PRN

## 2022-11-22 NOTE — Plan of Care (Signed)
  Problem: Clinical Measurements: Goal: Will remain free from infection Outcome: Progressing Goal: Respiratory complications will improve Outcome: Progressing Goal: Cardiovascular complication will be avoided Outcome: Progressing   Problem: Clinical Measurements: Goal: Will remain free from infection Outcome: Progressing Goal: Respiratory complications will improve Outcome: Progressing Goal: Cardiovascular complication will be avoided Outcome: Progressing   Problem: Clinical Measurements: Goal: Will remain free from infection Outcome: Progressing Goal: Respiratory complications will improve Outcome: Progressing Goal: Cardiovascular complication will be avoided Outcome: Progressing

## 2022-11-22 NOTE — Progress Notes (Signed)
   11/22/22 1033  TOC Brief Assessment  Insurance and Status Reviewed  Patient has primary care physician Yes  Home environment has been reviewed Resides with significant other and children  Prior level of function: Independent at baseline  Prior/Current Home Services No current home services  Social Determinants of Health Reivew SDOH reviewed no interventions necessary  Readmission risk has been reviewed Yes  Transition of care needs no transition of care needs at this time

## 2022-11-22 NOTE — Progress Notes (Signed)
Discharge instructions discussed with patient and verbalized agreement and understanding

## 2022-11-22 NOTE — Discharge Instructions (Signed)
EATING AFTER YOUR ESOPHAGEAL SURGERY (Stomach Fundoplication, Hiatal Hernia repair, Achalasia surgery, etc)  ######################################################################  EAT Start with a full liquid diet (see below) Gradually transition to a high fiber diet with a fiber supplement over the next month after discharge.    WALK Walk an hour a day.  Control your pain to do that.    CONTROL PAIN Control pain so that you can walk, sleep, tolerate sneezing/coughing, go up/down stairs.  HAVE A BOWEL MOVEMENT DAILY Keep your bowels regular to avoid problems.  OK to try a laxative to override constipation.  OK to use an antidairrheal to slow down diarrhea.  Call if not better after 2 tries  CALL IF YOU HAVE PROBLEMS/CONCERNS Call if you are still struggling despite following these instructions. Call if you have concerns not answered by these instructions  ######################################################################   After your esophageal surgery, expect some sticking with swallowing over the next 1-2 months.    If food sticks when you eat, it is called "dysphagia".  This is due to swelling around your esophagus at the wrap & hiatal diaphragm repair.  It will gradually ease off over the next few months.  To help you through this temporary phase, we start you out on a full liquid diet.  Your first meal in the hospital was thin liquids.  You should have been given a full liquid diet by the time you left the hospital. Stay on clears and full liquids for the first week. Some patients may need to stay on a liquid diet for up to 2 weeks if having trouble swallowing.  Once tolerating that well, you can advance to pureed diet.   We ask patients to stay on a pureed diet for the 2nd-3rd week to avoid anything getting "stuck" near your recent surgery.  Don't be alarmed if your ability to swallow doesn't progress according to this plan.  Everyone is different and some diets can advance  more or less quickly.    It is often helpful to crush your medications or split them as they can sometimes stick, especially the first week or so.   Some BASIC RULES to follow are: Maintain an upright position whenever eating or drinking. Take small bites - just a teaspoon size bite at a time. Eat slowly.  It may also help to eat only one food at a time. Consider nibbling through smaller, more frequent meals & avoid the urge to eat BIG meals Do not push through feelings of fullness, nausea, or bloatedness Do not mix solid foods and liquids in the same mouthful Try not to "wash foods down" with large gulps of liquids. Avoid carbonated (bubbly/fizzy) drinks.   Avoid foods that make you feel gassy or bloated.  Start with bland foods first.  Wait on trying greasy, fried, or spicy meals until you are tolerating more bland solids well. Understand that it will be hard to burp and belch at first.  This gradually improves with time.  Expect to be more gassy/flatulent/bloated initially.  Walking will help your body manage it better. Consider using medications for bloating that contain simethicone such as  Maalox or Gas-X  Consider crushing her medications, especially smaller pills.  The ability to swallow pills should get easier after a few weeks Eat in a relaxed atmosphere & minimize distractions. Avoid talking while eating.   Do not use straws. Following each meal, sit in an upright position (90 degree angle) for 60 to 90 minutes.  Going for a short walk can   help as well If food does stick, don't panic.  Try to relax and let the food pass on its own.  Sipping WARM LIQUID such as strong hot black tea can also help slide it down.   Be gradual in changes & use common sense:  -If you easily tolerating a certain "level" of foods, advance to the next level gradually -If you are having trouble swallowing a particular food, then avoid it.   -If food is sticking when you advance your diet, go back to  thinner previous diet (the lower LEVEL) for 1-2 days.  LEVEL 2 = PUREED DIET  Start 1- 2 WEEKS AFTER SURGERY IF YOU ARE TOLERATING A FULL LIQUID DIET EASILY  -Foods in this group are pureed or blenderized to a smooth, mashed potato-like consistency.  -If necessary, the pureed foods can keep their shape with the addition of a thickening agent.   -Meat should be pureed to a smooth, pasty consistency.  Hot broth or gravy may be added to the pureed meat, approximately 1 oz. of liquid per 3 oz. serving of meat. -CAUTION:  If any foods do not puree into a smooth consistency, swallowing will be more difficult.  (For example, nuts or seeds sometimes do not blend well.)  Hot Foods Cold Foods  Pureed scrambled eggs and cheese Pureed cottage cheese  Baby cereals Thickened juices and nectars  Thinned cooked cereals (no lumps) Thickened milk or eggnog  Pureed French toast or pancakes Ensure  Mashed potatoes Ice cream  Pureed parsley, au gratin, scalloped potatoes, candied sweet potatoes Fruit or Italian ice, sherbet  Pureed buttered or alfredo noodles Plain yogurt  Pureed vegetables (no corn or peas) Instant breakfast  Pureed soups and creamed soups Smooth pudding, mousse, custard  Pureed scalloped apples Whipped gelatin  Gravies Sugar, syrup, honey, jelly  Sauces, cheese, tomato, barbecue, white, creamed Cream  Any baby food Creamer  Alcohol in moderation (not beer or champagne) Margarine  Coffee or tea Mayonnaise   Ketchup, mustard   Apple sauce   SAMPLE MENU:  PUREED DIET Breakfast Lunch Dinner  Orange juice, 1/2 cup Cream of wheat, 1/2 cup Pineapple juice, 1/2 cup Pureed turkey, barley soup, 3/4 cup Pureed Hawaiian chicken, 3 oz  Scrambled eggs, mashed or blended with cheese, 1/2 cup Tea or coffee, 1 cup  Whole milk, 1 cup  Non-dairy creamer, 2 Tbsp. Mashed potatoes, 1/2 cup Pureed cooled broccoli, 1/2 cup Apple sauce, 1/2 cup Coffee or tea Mashed potatoes, 1/2 cup Pureed spinach,  1/2 cup Frozen yogurt, 1/2 cup Tea or coffee      LEVEL 3 = SOFT DIET  After your first 4 weeks, you can advance to a soft diet.   Keep on this diet until everything goes down easily.  Hot Foods Cold Foods  White fish Cottage cheese  Stuffed fish Junior baby fruit  Baby food meals Semi thickened juices  Minced soft cooked, scrambled, poached eggs nectars  Souffle & omelets Ripe mashed bananas  Cooked cereals Canned fruit, pineapple sauce, milk  potatoes Milkshake  Buttered or Alfredo noodles Custard  Cooked cooled vegetable Puddings, including tapioca  Sherbet Yogurt  Vegetable soup or alphabet soup Fruit ice, Italian ice  Gravies Whipped gelatin  Sugar, syrup, honey, jelly Junior baby desserts  Sauces:  Cheese, creamed, barbecue, tomato, white Cream  Coffee or tea Margarine   SAMPLE MENU:  LEVEL 3 Breakfast Lunch Dinner  Orange juice, 1/2 cup Oatmeal, 1/2 cup Scrambled eggs with cheese, 1/2 cup Decaffeinated tea,   1 cup Whole milk, 1 cup Non-dairy creamer, 2 Tbsp Pineapple juice, 1/2 cup Minced beef, 3 oz Gravy, 2 Tbsp Mashed potatoes, 1/2 cup Minced fresh broccoli, 1/2 cup Applesauce, 1/2 cup Coffee, 1 cup Turkey, barley soup, 3/4 cup Minced Hawaiian chicken, 3 oz Mashed potatoes, 1/2 cup Cooked spinach, 1/2 cup Frozen yogurt, 1/2 cup Non-dairy creamer, 2 Tbsp      LEVEL 4 = CHOPPED DIET  -After all the foods in level 3 (soft diet) are passing through well you should advance up to more chopped foods.  -It is still important to cut these foods into small pieces and eat slowly.  Hot Foods Cold Foods  Poultry Cottage cheese  Chopped Swedish meatballs Yogurt  Meat salads (ground or flaked meat) Milk  Flaked fish (tuna) Milkshakes  Poached or scrambled eggs Soft, cold, dry cereal  Souffles and omelets Fruit juices or nectars  Cooked cereals Chopped canned fruit  Chopped French toast or pancakes Canned fruit cocktail  Noodles or pasta (no rice) Pudding,  mousse, custard  Cooked vegetables (no frozen peas, corn, or mixed vegetables) Green salad  Canned small sweet peas Ice cream  Creamed soup or vegetable soup Fruit ice, Italian ice  Pureed vegetable soup or alphabet soup Non-dairy creamer  Ground scalloped apples Margarine  Gravies Mayonnaise  Sauces:  Cheese, creamed, barbecue, tomato, white Ketchup  Coffee or tea Mustard   SAMPLE MENU:  LEVEL 4 Breakfast Lunch Dinner  Orange juice, 1/2 cup Oatmeal, 1/2 cup Scrambled eggs with cheese, 1/2 cup Decaffeinated tea, 1 cup Whole milk, 1 cup Non-dairy creamer, 2 Tbsp Ketchup, 1 Tbsp Margarine, 1 tsp Salt, 1/4 tsp Sugar, 2 tsp Pineapple juice, 1/2 cup Ground beef, 3 oz Gravy, 2 Tbsp Mashed potatoes, 1/2 cup Cooked spinach, 1/2 cup Applesauce, 1/2 cup Decaffeinated coffee Whole milk Non-dairy creamer, 2 Tbsp Margarine, 1 tsp Salt, 1/4 tsp Pureed turkey, barley soup, 3/4 cup Barbecue chicken, 3 oz Mashed potatoes, 1/2 cup Ground fresh broccoli, 1/2 cup Frozen yogurt, 1/2 cup Decaffeinated tea, 1 cup Non-dairy creamer, 2 Tbsp Margarine, 1 tsp Salt, 1/4 tsp Sugar, 1 tsp    LEVEL 5:  REGULAR FOODS  -Foods in this group are soft, moist, regularly textured foods.   -This level includes meat and breads, which tend to be the hardest things to swallow.   -Eat very slowly, chew well and continue to avoid carbonated drinks. -most people are at this level in 6 weeks  Hot Foods Cold Foods  Baked fish or skinned Soft cheeses - cottage cheese  Souffles and omelets Cream cheese  Eggs Yogurt  Stuffed shells Milk  Spaghetti with meat sauce Milkshakes  Cooked cereal Cold dry cereals (no nuts, dried fruit, coconut)  French toast or pancakes Crackers  Buttered toast Fruit juices or nectars  Noodles or pasta (no rice) Canned fruit  Potatoes (all types) Ripe bananas  Soft, cooked vegetables (no corn, lima, or baked beans) Peeled, ripe, fresh fruit  Creamed soups or vegetable soup Cakes  (no nuts, dried fruit, coconut)  Canned chicken noodle soup Plain doughnuts  Gravies Ice cream  Bacon dressing Pudding, mousse, custard  Sauces:  Cheese, creamed, barbecue, tomato, white Fruit ice, Italian ice, sherbet  Decaffeinated tea or coffee Whipped gelatin  Pork chops Regular gelatin   Canned fruited gelatin molds   Sugar, syrup, honey, jam, jelly   Cream   Non-dairy   Margarine   Oil   Mayonnaise   Ketchup   Mustard   TROUBLESHOOTING IRREGULAR BOWELS    1) Avoid extremes of bowel movements (no bad constipation/diarrhea)  2) Miralax 17gm mixed in 8oz. water or juice-daily. May use BID as needed.  3) Gas-x,Phazyme, etc. as needed for gas & bloating.  4) Soft,bland diet. No spicy,greasy,fried foods.  5) Prilosec over-the-counter as needed  6) May hold gluten/wheat products from diet to see if symptoms improve.  7) May try probiotics (Align, Activa, etc) to help calm the bowels down  7) If symptoms become worse call back immediately.    If you have any questions please call our office at [facility]: [phone].  

## 2022-11-23 ENCOUNTER — Telehealth: Payer: Self-pay

## 2022-11-23 NOTE — Transitions of Care (Post Inpatient/ED Visit) (Signed)
   11/23/2022  Name: Joan Simpson MRN: [id number] DOB: 1990/05/05  Today's TOC FU Call Status: Today's TOC FU Call Status:: Unsuccessful Call (1st Attempt) Unsuccessful Call (1st Attempt) Date: 11/23/22  Attempted to reach the patient regarding the most recent Inpatient/ED visit.  Follow Up Plan: Additional outreach attempts will be made to reach the patient to complete the Transitions of Care (Post Inpatient/ED visit) call.   Signature  tb,cma

## 2022-11-24 ENCOUNTER — Telehealth: Payer: Self-pay

## 2022-11-24 NOTE — Transitions of Care (Post Inpatient/ED Visit) (Signed)
   11/24/2022  Name: Joan Simpson MRN: [id number] DOB: 02/26/1991  Today's TOC FU Call Status: Today's TOC FU Call Status:: Successful TOC FU Call Completed TOC FU Call Complete Date: 11/24/22  Transition Care Management Follow-up Telephone Call Date of Discharge: 11/22/22 Discharge Facility: [facility]) Type of Discharge: Inpatient Admission How have you been since you were released from the hospital?: Same Any questions or concerns?: No  Items Reviewed: Did you receive and understand the discharge instructions provided?: Yes Medications obtained,verified, and reconciled?: Yes (Medications Reviewed) Any new allergies since your discharge?: No Dietary orders reviewed?: NA Do you have support at home?: Yes People in Home: significant other  Medications Reviewed Today: Medications Reviewed Today   Medications were not reviewed in this encounter     Home Care and Equipment/Supplies: Were Home Health Services Ordered?: No Any new equipment or medical supplies ordered?: No  Functional Questionnaire: Do you need assistance with bathing/showering or dressing?: Yes Do you need assistance with meal preparation?: No Do you need assistance with eating?: No Do you have difficulty maintaining continence: No Do you need assistance with getting out of bed/getting out of a chair/moving?: Yes Do you have difficulty managing or taking your medications?: No  Follow up appointments reviewed: PCP Follow-up appointment confirmed?: No (declined- she will call specialist) Specialist Hospital Follow-up appointment confirmed?: No (states that they want it a month out. has appt with gastro 9/10) Do you need transportation to your follow-up appointment?: No Do you understand care options if your condition(s) worsen?: Yes-patient verbalized understanding    SIGNATURE tb,cma

## 2022-11-27 ENCOUNTER — Telehealth: Payer: Self-pay

## 2022-11-27 NOTE — Discharge Summary (Signed)
Physician Discharge Summary  Joan Simpson VWU:[id number] DOB: 12/04/1990 DOA: 11/21/2022  PCP: Joan Grandchild, MD  Admit date: 11/21/2022 Discharge date: 11/22/2022  Recommendations for Outpatient Follow-up:     Follow-up Information     Joan Adu, MD. Schedule an appointment as soon as possible for a visit in 4 week(s).   Specialty: General Surgery Why: For wound re-check Contact information: [address] [place] [address] [phone]                Discharge Diagnoses:  Recurrent paraesophageal hernia s/p repair History of robotic repair of type III hiatal hernia with nissen fundoplication August 2023  Surgical Procedure: ROBOTIC REPAIR RECURRENT PARAESOPHAGEAL HIATAL HERNIA WITH MESH UPPER GI ENDOSCOPY Laparoscopic bilateral tap block 11/21/22  Discharge Condition: goog Disposition: home  Diet recommendation: full liquid diet  Filed Weights   11/21/22 0548  Weight: 65.5 kg    History of present illness:  Patient underwent robotic repair of a type III hiatal hernia with Nissen fundoplication in August 2023.  She did quite well.  Her reflux had resolved.  Her only postop issue that persisted was some mild nausea.  She contracted COVID in the winter and developed a bad cough and coughed pretty profusely for about a week.  She then developed pain with eating and drinking.  She ended up coming to the emergency room and was found to have a recurrent hiatal hernia.  We were able to temporize her in order to give her more time for scarring to resolve prior to redoing her hiatal hernia.  She got to the point where she was having daily nausea, trouble tolerating solid foods.  Please see outside records for additional information  Hospital Course:  [facility] was brought into the hospital for planned robotic repair of recurrent paraesophageal hernia.  She was found to have a large recurrent paraesophageal hiatal hernia.  Her wrap had migrated into her  mediastinum.  Her wrap was intact and likely we did not have to take it down in order to reduce her herniated stomach as well as mobilize the esophagus.  The diaphragm was primarily repaired with mesh overlay.  She was kept overnight for observation.  She was started on clears on postop day 0.  On postoperative day 1 there is no signs of leak or obstruction on upper GI.  She was advanced to full liquid diet which she tolerated.  She was deemed stable for discharge.  We discussed discharge instructions.  She was discharged on a full liquid diet.  Pain was adequately controlled.  BP 112/71 (BP Location: Right Arm)   Pulse 97   Temp 98.5 F (36.9 C) (Oral)   Resp 19   Ht 5\' 1"  (1.549 m)   Wt 65.5 kg   LMP 11/14/2022 (Exact Date)   SpO2 100%   BMI 27.28 kg/m   Gen: alert, NAD, non-toxic appearing Pupils: equal, no scleral icterus Pulm: Lungs clear to auscultation, symmetric chest rise CV: regular rate and rhythm Abd: soft, mild approp tender, nondistended.  No cellulitis. No incisional hernia Ext: no edema, no calf tenderness Skin: no rash, no jaundice    Discharge Instructions  Discharge Instructions     Call MD for:   Complete by: As directed    Temperature >101   Call MD for:  hives   Complete by: As directed    Call MD for:  persistant dizziness or light-headedness   Complete by: As directed    Call MD  for:  persistant nausea and vomiting   Complete by: As directed    Call MD for:  redness, tenderness, or signs of infection (pain, swelling, redness, odor or green/yellow discharge around incision site)   Complete by: As directed    Call MD for:  severe uncontrolled pain   Complete by: As directed    Diet full liquid   Complete by: As directed    Discharge instructions   Complete by: As directed    See [facility] discharge instructions   Increase activity slowly   Complete by: As directed       Allergies as of 11/22/2022       Reactions   Erythromycin Anaphylaxis, Swelling    Says she throws up and can't breath        Medication List     STOP taking these medications    aspirin-acetaminophen-caffeine 250-250-65 MG tablet Commonly known as: EXCEDRIN MIGRAINE       TAKE these medications    acetaminophen 500 MG tablet Commonly known as: TYLENOL Take 2 tablets (1,000 mg total) by mouth every 8 (eight) hours for 5 days.   buPROPion 150 MG 24 hr tablet Commonly known as: WELLBUTRIN XL Take 150 mg by mouth daily.   buPROPion 300 MG 24 hr tablet Commonly known as: WELLBUTRIN XL Take 300 mg by mouth daily.   etonogestrel 68 MG Impl implant Commonly known as: NEXPLANON 1 each by Subdermal route once.   lamoTRIgine 200 MG tablet Commonly known as: LAMICTAL Take 200 mg by mouth daily.   methocarbamol 750 MG tablet Commonly known as: Robaxin-750 Take 1 tablet (750 mg total) by mouth every 8 (eight) hours as needed for muscle spasms.   ondansetron 8 MG disintegrating tablet Commonly known as: ZOFRAN-ODT Dissolve 1 tablet by mouth every 8 hours as needed for nausea or vomiting.   oxyCODONE 5 MG immediate release tablet Commonly known as: Oxy IR/ROXICODONE Take 1 tablet (5 mg total) by mouth every 6 (six) hours as needed for severe pain.   promethazine 12.5 MG tablet Commonly known as: PHENERGAN Take 1 tablet (12.5 mg total) by mouth every 8 (eight) hours as needed for vomiting or refractory nausea / vomiting.   RABEprazole 20 MG tablet Commonly known as: ACIPHEX Take 1 tablet (20 mg total) by mouth 2 (two) times daily. What changed: when to take this   sucralfate 1 g tablet Commonly known as: Carafate Take 1 tablet (1 g total) by mouth 2 (two) times daily as needed (ulcers).   VITAMIN B-12 PO Take 1 tablet by mouth once a week.        Follow-up Information     Joan Adu, MD. Schedule an appointment as soon as possible for a visit in 4 week(s).   Specialty: General Surgery Why: For wound re-check Contact information: [address] [place] [address] [phone]                  The results of significant diagnostics from this hospitalization (including imaging, microbiology, ancillary and laboratory) are listed below for reference.    Significant Diagnostic Studies: DG UGI W SINGLE CM (SOL OR THIN BA)  Result Date: 11/22/2022 CLINICAL DATA:  282018 S/P repair of paraesophageal hernia 282018 EXAM: UPPER GI SERIES WITH KUB TECHNIQUE: After obtaining a scout radiograph a routine upper GI series was performed using water-soluble contrast. FLUOROSCOPY: Radiation Exposure Index (as provided by the fluoroscopic device): 42.9 mGy Kerma COMPARISON:  Upper GI from  05/24/2022. FINDINGS: Scout: No free air under the domes of diaphragm. Visualized bilateral lungs and lateral costophrenic angles appear clear. Patient is status post repair of paraesophageal hernia. No evidence of leak. There is normal passage of barium from the esophagus into the stomach. No abnormal holdup. IMPRESSION: *Status post repair of paraesophageal hernia. No evidence of leak. Electronically Signed   By: Jules Schick M.D.   On: 11/22/2022 09:19    Microbiology: No results found for this or any previous visit (from the past 240 hour(s)).   Labs: Basic Metabolic Panel: Recent Labs  Lab 11/22/22 0429  NA 139  K 3.4*  CL 109  CO2 22  GLUCOSE 104*  BUN 5*  CREATININE 0.66  CALCIUM 8.3*      Principal Problem:   S/P repair of paraesophageal hernia   Time coordinating discharge: 20 min  Signed:  Atilano Ina, MD [facility], [place] [phone] 11/27/2022, 7:37 AM

## 2022-11-27 NOTE — Telephone Encounter (Signed)
-----   Message from [facility] sent at 11/25/2022  4:20 AM EDT ----- Regarding: Follow-up , This patient just underwent a redo esophageal hernia repair. She has been dealing with chronic nausea as a symptom. She has a history of gastric ulcer. She was scheduled for a procedure in September but Dr. Andrey Campanile and I have discussed that we should postpone that till at least October or November so that she can have further time to heal from her hernia repair. Instead she needs a clinic visit to follow-up her chronic nausea symptoms, and see how she is doing post surgery. Please let her know that is the reason why we are postponing her endoscopy. Please set her up a follow-up in clinic one of the APP's or myself otherwise. Thanks. GM

## 2022-11-27 NOTE — Telephone Encounter (Signed)
The pt returned call and has been advised of the appt cancellation as well as follow up appt. No questions or concerns at this time

## 2022-11-27 NOTE — Telephone Encounter (Signed)
Appt has been cancelled per order from GM   Left message on machine to call back   ROV scheduled for 01/24/23 at 930 am with GM

## 2022-12-26 ENCOUNTER — Encounter: Payer: 59 | Admitting: Gastroenterology

## 2023-01-24 ENCOUNTER — Other Ambulatory Visit (INDEPENDENT_AMBULATORY_CARE_PROVIDER_SITE_OTHER): Payer: 59

## 2023-01-24 ENCOUNTER — Ambulatory Visit (INDEPENDENT_AMBULATORY_CARE_PROVIDER_SITE_OTHER): Payer: 59 | Admitting: Gastroenterology

## 2023-01-24 ENCOUNTER — Encounter: Payer: Self-pay | Admitting: Gastroenterology

## 2023-01-24 VITALS — BP 94/60 | HR 102 | Ht 61.0 in | Wt 138.0 lb

## 2023-01-24 DIAGNOSIS — R110 Nausea: Secondary | ICD-10-CM

## 2023-01-24 DIAGNOSIS — K449 Diaphragmatic hernia without obstruction or gangrene: Secondary | ICD-10-CM | POA: Diagnosis not present

## 2023-01-24 DIAGNOSIS — R5383 Other fatigue: Secondary | ICD-10-CM | POA: Diagnosis not present

## 2023-01-24 DIAGNOSIS — R1319 Other dysphagia: Secondary | ICD-10-CM | POA: Diagnosis not present

## 2023-01-24 LAB — TSH: TSH: 0.97 u[IU]/mL (ref 0.35–5.50)

## 2023-01-24 MED ORDER — ONDANSETRON 8 MG PO TBDP: 8.0000 mg | ORAL_TABLET | Freq: Three times a day (TID) | ORAL | 3 refills | Status: AC | PRN

## 2023-01-24 NOTE — Patient Instructions (Signed)
We have sent the following medications to your pharmacy for you to pick up at your convenience: Zofran   Your provider has requested that you go to the basement level for lab work before leaving today. Press "B" on the elevator. The lab is located at the first door on the left as you exit the elevator.   You have been scheduled for a Barium Esophogram at [facility] (1st floor of the hospital) on 01/31/23 at 11:00 am. Please arrive 30 minutes prior to your appointment for registration. If you need to reschedule for any reason, please contact radiology at [phone] to do so. __________________________________________________________ A barium swallow is an examination that concentrates on views of the esophagus. This tends to be a double contrast exam (barium and two liquids which, when combined, create a gas to distend the wall of the oesophagus) or single contrast (non-ionic iodine based). The study is usually tailored to your symptoms so a good history is essential. Attention is paid during the study to the form, structure and configuration of the esophagus, looking for functional disorders (such as aspiration, dysphagia, achalasia, motility and reflux) EXAMINATION You may be asked to change into a gown, depending on the type of swallow being performed. A radiologist and radiographer will perform the procedure. The radiologist will advise you of the type of contrast selected for your procedure and direct you during the exam. You will be asked to stand, sit or lie in several different positions and to hold a small amount of fluid in your mouth before being asked to swallow while the imaging is performed .In some instances you may be asked to swallow barium coated marshmallows to assess the motility of a solid food bolus. The exam can be recorded as a digital or video fluoroscopy procedure. POST PROCEDURE It will take 1-2 days for the barium to pass through your system. To facilitate this,  it is important, unless otherwise directed, to increase your fluids for the next 24-48hrs and to resume your normal diet.  This test typically takes about 30 minutes to perform. __________________________________________________________    Joan Simpson have been scheduled for an endoscopy. Please follow written instructions given to you at your visit today.  If you use inhalers (even only as needed), please bring them with you on the day of your procedure.  If you take any of the following medications, they will need to be adjusted prior to your procedure:   DO NOT TAKE 7 DAYS PRIOR TO TEST- Trulicity (dulaglutide) Ozempic, Wegovy (semaglutide) Mounjaro (tirzepatide) Bydureon Bcise (exanatide extended release)  DO NOT TAKE 1 DAY PRIOR TO YOUR TEST Rybelsus (semaglutide) Adlyxin (lixisenatide) Victoza (liraglutide) Byetta (exanatide)   If your blood pressure at your visit was 140/90 or greater, please contact your primary care physician to follow up on this.  _______________________________________________________  If you are age 31 or older, your body mass index should be between 23-30. Your Body mass index is 26.07 kg/m. If this is out of the aforementioned range listed, please consider follow up with your Primary Care Provider.  If you are age 49 or younger, your body mass index should be between 19-25. Your Body mass index is 26.07 kg/m. If this is out of the aformentioned range listed, please consider follow up with your Primary Care Provider.   ________________________________________________________  The [facility] providers would like to encourage you to use [facility] to communicate with providers for non-urgent requests or questions.  Due to long hold times on the telephone, sending  your provider a message by [facility] may be a faster and more efficient way to get a response.  Please allow 48 business hours for a response.  Please remember that this is for non-urgent requests.   _____________________________________________________  Due to recent changes in healthcare laws, you may see the results of your imaging and laboratory studies on MyChart before your provider has had a chance to review them.  We understand that in some cases there may be results that are confusing or concerning to you. Not all laboratory results come back in the same time frame and the provider may be waiting for multiple results in order to interpret others.  Please give [place] 48 hours in order for your provider to thoroughly review all the results before contacting the office for clarification of your results.   Thank you for choosing me and [facility].  Dr. Meridee Score    .

## 2023-01-24 NOTE — Addendum Note (Signed)
Addended by: Corliss Parish on: 01/24/2023 01:24 PM   Modules accepted: Level of Service

## 2023-01-24 NOTE — Progress Notes (Signed)
GASTROENTEROLOGY OUTPATIENT CLINIC VISIT   Primary Care Provider Etta Grandchild, MD [address] [place] [address] [phone]   Patient Profile: Joan Simpson is a 32 y.o. female with a pmh significant for bipolar disorder, MDD, anxiety, hypertension, GERD, large hiatal hernia (post Nissen & HH repair x2), hemorrhoids, colon polyps (SSP).  The patient presents to the [facility] for an evaluation and management of problem(s) noted below:  Problem List 1. Hiatal hernia   2. Esophageal dysphagia   3. Chronic nausea   4. Other fatigue    Discussed the use of AI scribe software for clinical note transcription with the patient, who gave verbal consent to proceed.  History of Present Illness Please see prior notes for full details of HPI.  Interval History The patient presents for follow-up.  She had to undergo a redo hiatal hernia repair this August with Dr. Andrey Campanile.  Her symptoms of nausea are improved, though not completely gone.  She still uses Zofran as needed within the last few weeks.   The majority of her nausea was primarily associated with pain medication intake and has been absent decreased since transitioning to the liquid diet. The patient has been attempting to reintroduce solid foods, such as mashed potatoes, into their diet, but experiences difficulty swallowing and sensation of taking longer to pass.  No odynophagia.  The food getting 'stuck' in the esophagus will eventually passes with the aid of additional water.  The patient has lost approximately ten pounds since the most recent surgery and has maintained this weight loss. They report intermittent right-sided abdominal pain, described as sharp and stabbing, at the site of surgical incisions from the most recent procedure. The pain is not constant and appears to come and go and is not associated with food intake per sea.  Despite these challenges, the patient perceives a significant  improvement in their symptoms compared to their pre-surgical state. They report persistent fatigue, which they attribute to the recovery process from their surgeries. The patient has not had any recent thyroid function tests, which could potentially contribute to their fatigue. They have a history of anemia, but recent blood work showed a return to normal hemoglobin levels.   GI Review of Systems Positive as above Negative for odynophagia, early satiety, alteration of bowel habits, melena, hematochezia  Review of Systems General: Denies fevers/chills/weight loss unintentionally Cardiovascular: Denies chest pain Pulmonary: Denies shortness of breath Gastroenterological: See HPI Genitourinary: Denies darkened urine Hematological: Denies easy bruising/bleeding Dermatological: Denies jaundice Psychological: Mood is stable   Medications Current Outpatient Medications  Medication Sig Dispense Refill   buPROPion (WELLBUTRIN XL) 150 MG 24 hr tablet Take 150 mg by mouth daily.     buPROPion (WELLBUTRIN XL) 300 MG 24 hr tablet Take 300 mg by mouth daily.     etonogestrel (NEXPLANON) 68 MG IMPL implant 1 each by Subdermal route once.     lamoTRIgine (LAMICTAL) 200 MG tablet Take 200 mg by mouth daily.     promethazine (PHENERGAN) 12.5 MG tablet Take 1 tablet (12.5 mg total) by mouth every 8 (eight) hours as needed for vomiting or refractory nausea / vomiting. 15 tablet 0   RABEprazole (ACIPHEX) 20 MG tablet Take 20 mg by mouth daily.     sucralfate (CARAFATE) 1 g tablet Take 1 tablet (1 g total) by mouth 2 (two) times daily as needed (ulcers). 60 tablet 0   ondansetron (ZOFRAN-ODT) 8 MG disintegrating tablet Dissolve 1 tablet by mouth every 8 hours  as needed for nausea or vomiting. 90 tablet 3   No current facility-administered medications for this visit.    Allergies Allergies  Allergen Reactions   Erythromycin Anaphylaxis and Swelling    Says she throws up and can't breath      Histories Past Medical History:  Diagnosis Date   Allergy    Anemia    Anxiety    Bipolar 1 disorder (HCC)    Depression    GERD (gastroesophageal reflux disease)    Hypertension    Nexplanon in place 12/09/2021   left upper arm, use caution when using left upper arm, no B/P cuff to that area please   Pre-diabetes    Past Surgical History:  Procedure Laterality Date   NO PAST SURGERIES     UPPER GI ENDOSCOPY N/A 12/12/2021   Procedure: UPPER GI ENDOSCOPY;  Surgeon: [name], MD;  Location: [facility];  Service: General;  Laterality: N/A;   UPPER GI ENDOSCOPY N/A 11/21/2022   Procedure: UPPER GI ENDOSCOPY;  Surgeon: [name], MD;  Location: [facility];  Service: General;  Laterality: N/A;   XI ROBOTIC ASSISTED HIATAL HERNIA REPAIR N/A 12/12/2021   Procedure: XI ROBOTIC ASSISTED HIATAL HERNIA  REPAIR WITH FUNDOPLICATION;  Surgeon: [name], MD;  Location: [facility];  Service: General;  Laterality: N/A;   XI ROBOTIC ASSISTED PARAESOPHAGEAL HERNIA REPAIR N/A 11/21/2022   Procedure: ROBOTIC REPAIR RECURRENT PARAESOPHAGEAL HIATAL HERNIA WITH MESH;  Surgeon: [name], MD;  Location: [facility];  Service: General;  Laterality: N/A;   Social History   Socioeconomic History   Marital status: Single    Spouse name: Not on file   Number of children: 1   Years of education: Not on file   Highest education level: Not on file  Occupational History   Occupation: Engineer, structural  Tobacco Use   Smoking status: Never    Passive exposure: Never   Smokeless tobacco: Never  Vaping Use   Vaping status: Never Used  Substance and Sexual Activity   Alcohol use: Not Currently   Drug use: Not Currently   Sexual activity: Yes    Partners: Male    Birth control/protection: Implant  Other Topics Concern   Not on file  Social History Narrative   Not on file   Social Determinants of Health   Financial Resource Strain: Not on file  Food Insecurity: No Food Insecurity (11/21/2022)   Hunger  Vital Sign    Worried About Running Out of Food in the Last Year: Never true    Ran Out of Food in the Last Year: Never true  Transportation Needs: No Transportation Needs (11/21/2022)   PRAPARE - Administrator, Civil Service (Medical): No    Lack of Transportation (Non-Medical): No  Physical Activity: Not on file  Stress: Not on file  Social Connections: Not on file  Intimate Partner Violence: Not At Risk (11/21/2022)   Humiliation, Afraid, Rape, and Kick questionnaire    Fear of Current or Ex-Partner: No    Emotionally Abused: No    Physically Abused: No    Sexually Abused: No   Family History  Problem Relation Age of Onset   Diabetes Mother    Hypertension Mother    Heart disease Mother    Other Father        prediabetes   Hypertension Father    Diabetes Brother    Heart disease Brother    Diabetes Paternal Grandmother    Colon cancer Neg  Hx    Esophageal cancer Neg Hx    Inflammatory bowel disease Neg Hx    Liver disease Neg Hx    Pancreatic cancer Neg Hx    Rectal cancer Neg Hx    Stomach cancer Neg Hx    I have reviewed her medical, social, and family history in detail and updated the electronic medical record as necessary.    PHYSICAL EXAMINATION  BP 94/60   Pulse (!) 102   Ht 5\' 1"  (1.549 m)   Wt 138 lb (62.6 kg)   SpO2 98%   BMI 26.07 kg/m  Wt Readings from Last 3 Encounters:  01/24/23 138 lb (62.6 kg)  11/21/22 144 lb 6.4 oz (65.5 kg)  11/17/22 144 lb 6.4 oz (65.5 kg)  GEN: NAD, appears stated age, doesn't appear chronically ill PSYCH: Cooperative, without pressured speech EYE: Conjunctivae pink, sclerae anicteric ENT: MMM CV: Nontachycardic RESP: No audible wheezing GI: NABS, soft, NT mildly protuberant, without rebound or guarding MSK/EXT: No lower extremity edema SKIN: No jaundice NEURO:  Alert & Oriented x 3, no focal deficits   REVIEW OF DATA  I reviewed the following data at the time of this encounter:  GI Procedures and  Studies  May 2024 EGD - No gross lesions in the entire esophagus. - Z-line irregular, 34 cm from the incisors. - 8 cm paraesophageal hernia. - Non-bleeding gastric ulcers with a clean ulcer base (Forrest Class III). - Gastritis. Biopsied. - No gross lesions in the duodenal bulb, in the first portion of the duodenum and in the second portion of the duodenum. Biopsied.   Laboratory Studies  Reviewed those in EPIC Reviewed those in patients OB/GYN EMR LABS BUN: 14 mg/dL Creatinine: 1.61 mg/dL Sodium: 096 mmol/L WBC: 7.6 x10^3/L Hb: 12.3 g/dL Hct: 04.5% MCV: 40.9 fL   Imaging Studies  No new imaging studies to review   ASSESSMENT  Ms. Gautier is a 32 y.o. female with a pmh significant for bipolar disorder, MDD, anxiety, hypertension, GERD, large hiatal hernia (post Nissen & HH repair x2), hemorrhoids, colon polyps (SSP).  The patient is seen today for evaluation and management of:  1. Hiatal hernia   2. Esophageal dysphagia   3. Chronic nausea   4. Other fatigue    Patient is hemodynamically and clinically stable at this time.  I am hopeful for her that the hiatal hernia repair will be permanent.  She has had an extensive workup for her nausea but it seems to be improved now that her hiatal hernia has remained stable.  We will keep our fingers crossed.  She can use as needed antiemetic if needed.  The dysphagia that she is experiencing postoperatively, is likely still the process of her healing, but I want to further evaluate this with a barium swallow and tentatively plan for an endoscopy.  Will work on seeing and ensuring what the results of this show and make sure that there is no contraindication from Dr. Andrey Campanile standpoint if I were to proceed with an empiric dilation see if that may further help her symptoms, but this may take weeks to months to further improve as I have seen in other individuals who have undergone large hiatal hernia repairs.  I think the right upper quadrant  discomfort again is most likely incisional and postoperative, I will hold on ordering a CT scan but if things persist or if there are other concerns we can consider a cross-sectional CT since she has had multiple surgeries.  Since her  blood counts are normalized at this point I will plan to just check a thyroid function for her fatigue.  The risks and benefits of endoscopic evaluation were discussed with the patient; these include but are not limited to the risk of perforation, infection, bleeding, missed lesions, lack of diagnosis, severe illness requiring hospitalization, as well as anesthesia and sedation related illnesses.  The patient and/or family is agreeable to proceed.  All patient questions were answered to the best of my ability, and the patient agrees to the aforementioned plan of action with follow-up as indicated.   PLAN  Continue Aciphex daily As needed Zofran Proceed with scheduling barium swallow Follow-up with Dr. Andrey Campanile Proceed with scheduling EGD with empiric dilation pending no contraindication from surgical standpoint Laboratories as outlined below   Orders Placed This Encounter  Procedures   DG ESOPHAGUS W SINGLE CM (SOL OR THIN BA)   TSH   Ambulatory referral to Gastroenterology    New Prescriptions   No medications on file   Modified Medications   Modified Medication Previous Medication   ONDANSETRON (ZOFRAN-ODT) 8 MG DISINTEGRATING TABLET ondansetron (ZOFRAN-ODT) 8 MG disintegrating tablet      Dissolve 1 tablet by mouth every 8 hours as needed for nausea or vomiting.    Dissolve 1 tablet by mouth every 8 hours as needed for nausea or vomiting.    Planned Follow Up No follow-ups on file.   Total Time in Face-to-Face and in Coordination of Care for patient including independent/personal interpretation/review of prior testing, medical history, examination, medication adjustment, communicating results with the patient directly, and documentation within the EHR  is 25 minutes.   Corliss Parish, MD [facility] Office # [phone]

## 2023-01-31 ENCOUNTER — Inpatient Hospital Stay (HOSPITAL_COMMUNITY): Admission: RE | Admit: 2023-01-31 | Payer: 59 | Source: Ambulatory Visit

## 2023-02-16 ENCOUNTER — Ambulatory Visit
Admission: RE | Admit: 2023-02-16 | Discharge: 2023-02-16 | Disposition: A | Discharge status: Home / Self Care | Payer: 59 | Source: Ambulatory Visit | Attending: Gastroenterology | Admitting: Gastroenterology

## 2023-02-16 ENCOUNTER — Ambulatory Visit: Payer: 59 | Admitting: Gastroenterology

## 2023-02-16 ENCOUNTER — Encounter: Payer: Self-pay | Admitting: Gastroenterology

## 2023-02-16 ENCOUNTER — Other Ambulatory Visit: Payer: Self-pay | Admitting: Gastroenterology

## 2023-02-16 VITALS — BP 109/52 | HR 81 | Temp 99.1°F | Resp 13 | Ht 61.0 in | Wt 138.0 lb

## 2023-02-16 DIAGNOSIS — R0789 Other chest pain: Secondary | ICD-10-CM | POA: Diagnosis not present

## 2023-02-16 DIAGNOSIS — R110 Nausea: Secondary | ICD-10-CM

## 2023-02-16 DIAGNOSIS — R1319 Other dysphagia: Secondary | ICD-10-CM | POA: Diagnosis present

## 2023-02-16 DIAGNOSIS — K229 Disease of esophagus, unspecified: Secondary | ICD-10-CM

## 2023-02-16 DIAGNOSIS — K219 Gastro-esophageal reflux disease without esophagitis: Secondary | ICD-10-CM

## 2023-02-16 DIAGNOSIS — K449 Diaphragmatic hernia without obstruction or gangrene: Secondary | ICD-10-CM

## 2023-02-16 MED ORDER — SODIUM CHLORIDE 0.9 % IV SOLN: 500.0000 mL | Freq: Once | INTRAVENOUS | Status: DC

## 2023-02-16 MED ORDER — RABEPRAZOLE SODIUM 20 MG PO TBEC: DELAYED_RELEASE_TABLET | ORAL | 6 refills | Status: AC

## 2023-02-16 NOTE — Patient Instructions (Addendum)
  Discharge instructions given. Handout on Dilatation Diet. Prescription sent to pharmacy. Patient to xray after discharge. Resume previous medications. YOU HAD AN ENDOSCOPIC PROCEDURE TODAY AT THE [facility]:   Refer to the procedure report that was given to you for any specific questions about what was found during the examination.  If the procedure report does not answer your questions, please call your gastroenterologist to clarify.  If you requested that your care partner not be given the details of your procedure findings, then the procedure report has been included in a sealed envelope for you to review at your convenience later.  YOU SHOULD EXPECT: Some feelings of bloating in the abdomen. Passage of more gas than usual.  Walking can help get rid of the air that was put into your GI tract during the procedure and reduce the bloating. If you had a lower endoscopy (such as a colonoscopy or flexible sigmoidoscopy) you may notice spotting of blood in your stool or on the toilet paper. If you underwent a bowel prep for your procedure, you may not have a normal bowel movement for a few days.  Please Note:  You might notice some irritation and congestion in your nose or some drainage.  This is from the oxygen used during your procedure.  There is no need for concern and it should clear up in a day or so.  SYMPTOMS TO REPORT IMMEDIATELY:   Following upper endoscopy (EGD)  Vomiting of blood or coffee ground material  New chest pain or pain under the shoulder blades  Painful or persistently difficult swallowing  New shortness of breath  Fever of 100F or higher  Black, tarry-looking stools  For urgent or emergent issues, a gastroenterologist can be reached at any hour by calling [phone]. Do not use MyChart messaging for urgent concerns.    DIET:  We do recommend a small meal at first, but then you may proceed to your regular diet.  Drink plenty of fluids but you should  avoid alcoholic beverages for 24 hours.  ACTIVITY:  You should plan to take it easy for the rest of today and you should NOT DRIVE or use heavy machinery until tomorrow (because of the sedation medicines used during the test).    FOLLOW UP: Our staff will call the number listed on your records the next business day following your procedure.  We will call around 7:15- 8:00 am to check on you and address any questions or concerns that you may have regarding the information given to you following your procedure. If we do not reach you, we will leave a message.     If any biopsies were taken you will be contacted by phone or by letter within the next 1-3 weeks.  Please call us at [phone] if you have not heard about the biopsies in 3 weeks.    SIGNATURES/CONFIDENTIALITY: You and/or your care partner have signed paperwork which will be entered into your electronic medical record.  These signatures attest to the fact that that the information above on your After Visit Summary has been reviewed and is understood.  Full responsibility of the confidentiality of this discharge information lies with you and/or your care-partner.

## 2023-02-16 NOTE — Progress Notes (Signed)
GASTROENTEROLOGY PROCEDURE H&P NOTE   Primary Care Physician: Etta Grandchild, MD  HPI: Joan Simpson is a 32 y.o. female who presents for EGD for followup of nausea and vomiting and dysphagia post [facility] repair.  Past Medical History:  Diagnosis Date   Allergy    Anemia    Anxiety    Bipolar 1 disorder (HCC)    Depression    GERD (gastroesophageal reflux disease)    Hypertension    Nexplanon in place 12/09/2021   left upper arm, use caution when using left upper arm, no B/P cuff to that area please   Pre-diabetes    Past Surgical History:  Procedure Laterality Date   NO PAST SURGERIES     UPPER GI ENDOSCOPY N/A 12/12/2021   Procedure: UPPER GI ENDOSCOPY;  Surgeon: [name], MD;  Location: [facility];  Service: General;  Laterality: N/A;   UPPER GI ENDOSCOPY N/A 11/21/2022   Procedure: UPPER GI ENDOSCOPY;  Surgeon: [name], MD;  Location: [facility];  Service: General;  Laterality: N/A;   XI ROBOTIC ASSISTED HIATAL HERNIA REPAIR N/A 12/12/2021   Procedure: XI ROBOTIC ASSISTED HIATAL HERNIA  REPAIR WITH FUNDOPLICATION;  Surgeon: [name], MD;  Location: [facility];  Service: General;  Laterality: N/A;   XI ROBOTIC ASSISTED PARAESOPHAGEAL HERNIA REPAIR N/A 11/21/2022   Procedure: 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     buPROPion (WELLBUTRIN XL) 300 MG 24 hr tablet Take 300 mg by mouth daily.     etonogestrel (NEXPLANON) 68 MG IMPL implant 1 each by Subdermal route once.     lamoTRIgine (LAMICTAL) 200 MG tablet Take 200 mg by mouth daily.     ondansetron (ZOFRAN-ODT) 8 MG disintegrating tablet Dissolve 1 tablet by mouth every 8 hours as needed for nausea or vomiting. 90 tablet 3   promethazine (PHENERGAN) 12.5 MG tablet Take 1  tablet (12.5 mg total) by mouth every 8 (eight) hours as needed for vomiting or refractory nausea / vomiting. 15 tablet 0   RABEprazole (ACIPHEX) 20 MG tablet Take 20 mg by mouth daily.     sucralfate (CARAFATE) 1 g tablet Take 1 tablet (1 g total) by mouth 2 (two) times daily as needed (ulcers). 60 tablet 0   Current Facility-Administered Medications  Medication Dose Route Frequency Provider Last Rate Last Admin   0.9 %  sodium chloride infusion  500 mL Intravenous Once Mansouraty, Netty Starring., MD        Current Outpatient Medications:    buPROPion (WELLBUTRIN XL) 150 MG 24 hr tablet, Take 150 mg by mouth daily., Disp: , Rfl:    buPROPion (WELLBUTRIN XL) 300 MG 24 hr tablet, Take 300 mg by mouth daily., Disp: , Rfl:    etonogestrel (NEXPLANON) 68 MG IMPL implant, 1 each by Subdermal route once., Disp: , Rfl:    lamoTRIgine (LAMICTAL) 200 MG tablet, Take 200 mg by mouth daily., Disp: , Rfl:    ondansetron (ZOFRAN-ODT) 8 MG disintegrating tablet, Dissolve 1 tablet by mouth every 8 hours as needed for nausea or vomiting., Disp: 90 tablet, Rfl: 3   promethazine (PHENERGAN) 12.5 MG tablet, Take 1 tablet (12.5 mg total) by mouth every 8 (eight) hours as needed for vomiting or refractory nausea / vomiting.,  Disp: 15 tablet, Rfl: 0   RABEprazole (ACIPHEX) 20 MG tablet, Take 20 mg by mouth daily., Disp: , Rfl:    sucralfate (CARAFATE) 1 g tablet, Take 1 tablet (1 g total) by mouth 2 (two) times daily as needed (ulcers)., Disp: 60 tablet, Rfl: 0  Current Facility-Administered Medications:    0.9 %  sodium chloride infusion, 500 mL, Intravenous, Once, Mansouraty, Netty Starring., MD Allergies  Allergen Reactions   Erythromycin Anaphylaxis and Swelling    Says she throws up and can't breath    Family History  Problem Relation Age of Onset   Diabetes Mother    Hypertension Mother    Heart disease Mother    Other Father        prediabetes   Hypertension Father    Diabetes Brother    Heart disease  Brother    Diabetes Paternal Grandmother    Colon cancer Neg Hx    Esophageal cancer Neg Hx    Inflammatory bowel disease Neg Hx    Liver disease Neg Hx    Pancreatic cancer Neg Hx    Rectal cancer Neg Hx    Stomach cancer Neg Hx    Social History   Socioeconomic History   Marital status: Single    Spouse name: Not on file   Number of children: 1   Years of education: Not on file   Highest education level: Not on file  Occupational History   Occupation: Engineer, structural  Tobacco Use   Smoking status: Never    Passive exposure: Never   Smokeless tobacco: Never  Vaping Use   Vaping status: Never Used  Substance and Sexual Activity   Alcohol use: Not Currently   Drug use: Not Currently   Sexual activity: Yes    Partners: Male    Birth control/protection: Implant  Other Topics Concern   Not on file  Social History Narrative   Not on file   Social Determinants of Health   Financial Resource Strain: Not on file  Food Insecurity: No Food Insecurity (11/21/2022)   Hunger Vital Sign    Worried About Running Out of Food in the Last Year: Never true    Ran Out of Food in the Last Year: Never true  Transportation Needs: No Transportation Needs (11/21/2022)   PRAPARE - Administrator, Civil Service (Medical): No    Lack of Transportation (Non-Medical): No  Physical Activity: Not on file  Stress: Not on file  Social Connections: Not on file  Intimate Partner Violence: Not At Risk (11/21/2022)   Humiliation, Afraid, Rape, and Kick questionnaire    Fear of Current or Ex-Partner: No    Emotionally Abused: No    Physically Abused: No    Sexually Abused: No    Physical Exam: Today's Vitals   02/16/23 1335  BP: (!) 99/57  Pulse: 96  Temp: 99.1 F (37.3 C)  TempSrc: Skin  SpO2: 99%  Weight: 138 lb (62.6 kg)  Height: 5\' 1"  (1.549 m)   Body mass index is 26.07 kg/m. GEN: NAD EYE: Sclerae anicteric ENT: MMM CV: Non-tachycardic GI: Soft, NT/ND NEURO:   Alert & Oriented x 3  Lab Results: No results for input(s): "WBC", "HGB", "HCT", "PLT" in the last 72 hours. BMET No results for input(s): "NA", "K", "CL", "CO2", "GLUCOSE", "BUN", "CREATININE", "CALCIUM" in the last 72 hours. LFT No results for input(s): "PROT", "ALBUMIN", "AST", "ALT", "ALKPHOS", "BILITOT", "BILIDIR", "IBILI" in the last 72 hours. PT/INR No results for  input(s): "LABPROT", "INR" in the last 72 hours.   Impression / Plan: This is a 32 y.o.female who presents for EGD for followup of nausea and vomiting and dysphagia post [facility] repair.  The risks and benefits of endoscopic evaluation/treatment were discussed with the patient and/or family; these include but are not limited to the risk of perforation, infection, bleeding, missed lesions, lack of diagnosis, severe illness requiring hospitalization, as well as anesthesia and sedation related illnesses.  The patient's history has been reviewed, patient examined, no change in status, and deemed stable for procedure.  The patient and/or family is agreeable to proceed.    Corliss Parish, MD [facility]oscopy Office # [phone]

## 2023-02-16 NOTE — Progress Notes (Signed)
Pt in recovery with monitors in place, VSS. Report given to receiving RN. Bite guard was placed with pt awake to ensure comfort. No dental or soft tissue damage noted. 

## 2023-02-16 NOTE — Progress Notes (Signed)
Pt's states no medical or surgical changes since previsit or office visit. 

## 2023-02-16 NOTE — Op Note (Signed)
[facility] Patient Name: Joan Simpson Procedure Date: 02/16/2023 2:12 PM MRN: [id number] Endoscopist: Corliss Parish , MD, [id number] Age: 32 Referring MD:  Date of Birth: [birth year] Gender: Female Account #: [id number] Procedure:                Upper GI endoscopy Indications:              Dysphagia, Follow-up of hiatal hernia Medicines:                Monitored Anesthesia Care Procedure:                Pre-Anesthesia Assessment:                           - Prior to the procedure, a History and Physical                            was performed, and patient medications and                            allergies were reviewed. The patient's tolerance of                            previous anesthesia was also reviewed. The risks                            and benefits of the procedure and the sedation                            options and risks were discussed with the patient.                            All questions were answered, and informed consent                            was obtained. Prior Anticoagulants: The patient has                            taken no anticoagulant or antiplatelet agents. ASA                            Grade Assessment: II - A patient with mild systemic                            disease. After reviewing the risks and benefits,                            the patient was deemed in satisfactory condition to                            undergo the procedure.                           After obtaining informed consent, the endoscope was  passed under direct vision. Throughout the                            procedure, the patient's blood pressure, pulse, and                            oxygen saturations were monitored continuously. The                            GIF [id number] #[id number] was introduced through the                            mouth, and advanced to the second part of duodenum.                            The upper  GI endoscopy was accomplished without                            difficulty. The patient tolerated the procedure. Scope In: Scope Out: Findings:                 No gross lesions were noted in the entire                            esophagus. A guidewire was placed and the scope was                            withdrawn. Dilation was performed with a Savary                            dilator with no resistance at 16 mm and mild                            resistance at 18 mm. The dilation site was examined                            following endoscope reinsertion and showed no                            change.                           The Z-line was irregular and was found 35 cm from                            the incisors.                           Evidence of a prior partial fundoplication was                            found in the cardia. This was characterized by  healthy appearing mucosa.                           No other gross lesions were noted in the entire                            examined stomach.                           No gross lesions were noted in the duodenal bulb,                            in the first portion of the duodenum and in the                            second portion of the duodenum. Complications:            No immediate complications. Estimated Blood Loss:     Estimated blood loss: none. Impression:               - No gross lesions in the entire esophagus. Dilated.                           - Z-line irregular, 35 cm from the incisors.                           - An a partial fundoplication was found,                            characterized by healthy appearing mucosa.                           - No other gross lesions in the entire stomach.                           - No gross lesions in the duodenal bulb, in the                            first portion of the duodenum and in the second                            portion of  the duodenum. Recommendation:           - The patient will be observed post-procedure,                            until all discharge criteria are met.                           - Discharge patient to home.                           - Patient has a contact number available for  emergencies. The signs and symptoms of potential                            delayed complications were discussed with the                            patient. Return to normal activities tomorrow.                            Written discharge instructions were provided to the                            patient.                           - Dilation diet as per protocol.                           - Please use Cepacol or Halls Lozenges +/-                            Chloraseptic spray for next 72-96 hours to aid in                            sore thoat should you experience this.                           - For next 42-month increase PPI to twice daily. See                            if this makes difference for rare chest discomfort.                           - Obtain 2-view CXR for chest discomfort evaluation                            later today or next week, if patient desires.                           - Continue present medications otherwise.                           - Suspect that dysphagia will continue to gradually                            improve post HH re-do repair.                           - The findings and recommendations were discussed                            with the patient.                           - The findings and  recommendations were discussed                            with the patient's family. Corliss Parish, MD 02/16/2023 2:24:56 PM

## 2023-02-16 NOTE — Progress Notes (Signed)
Called to room to assist during endoscopic procedure.  Patient ID and intended procedure confirmed with present staff. Received instructions for my participation in the procedure from the performing physician.  

## 2023-02-16 NOTE — Progress Notes (Signed)
NA

## 2023-02-19 ENCOUNTER — Telehealth: Payer: Self-pay | Admitting: *Deleted

## 2023-02-19 NOTE — Telephone Encounter (Signed)
  Follow up Call-     02/16/2023    1:36 PM 08/16/2022   12:49 PM 08/16/2022   12:46 PM 06/23/2021    3:23 PM  Call back number  Post procedure Call Back phone  # [phone]  [phone]  Permission to leave phone message Yes  Yes Yes    Post procedure follow up phone call. No answer at number given.  Left message on voicemail.

## 2023-02-22 ENCOUNTER — Encounter: Payer: Self-pay | Admitting: Physician Assistant

## 2023-02-22 ENCOUNTER — Other Ambulatory Visit (HOSPITAL_COMMUNITY): Payer: Self-pay
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
## 2023-07-12 ENCOUNTER — Telehealth: Payer: Self-pay | Admitting: Physical Therapy

## 2023-07-12 ENCOUNTER — Ambulatory Visit: Payer: 59 | Attending: General Surgery | Admitting: Physical Therapy

## 2023-07-12 NOTE — Telephone Encounter (Signed)
 Left pt a VM re missed PT eval, pt to call us

## 2023-07-19 ENCOUNTER — Ambulatory Visit: Payer: 59 | Admitting: Physical Therapy

## 2023-07-26 ENCOUNTER — Ambulatory Visit: Payer: 59 | Attending: General Surgery | Admitting: Physical Therapy

## 2023-07-26 ENCOUNTER — Telehealth: Payer: Self-pay | Admitting: Physical Therapy

## 2023-07-26 NOTE — Telephone Encounter (Signed)
 Left pt a VM re missed PT eval ( second no show)

## 2023-08-02 ENCOUNTER — Encounter: Payer: 59 | Admitting: Physical Therapy
